# Patient Record
Sex: Female | Born: 1960 | Race: White | Hispanic: No | Marital: Married | Smoking: Never smoker
Health system: Southern US, Community
[De-identification: ages and names within clinical notes are randomized; demographics above are authoritative.]

## PROBLEM LIST (undated history)

## (undated) DIAGNOSIS — T7840XA Allergy, unspecified, initial encounter: Secondary | ICD-10-CM

## (undated) DIAGNOSIS — I10 Essential (primary) hypertension: Secondary | ICD-10-CM

## (undated) DIAGNOSIS — E785 Hyperlipidemia, unspecified: Secondary | ICD-10-CM

## (undated) DIAGNOSIS — E119 Type 2 diabetes mellitus without complications: Secondary | ICD-10-CM

## (undated) HISTORY — DX: Hyperlipidemia, unspecified: E78.5

## (undated) HISTORY — DX: Allergy, unspecified, initial encounter: T78.40XA

## (undated) HISTORY — PX: WISDOM TOOTH EXTRACTION: SHX21

## (undated) HISTORY — DX: Type 2 diabetes mellitus without complications: E11.9

## (undated) HISTORY — PX: UMBILICAL HERNIA REPAIR: SUR1181

---

## 2005-10-01 ENCOUNTER — Ambulatory Visit: Payer: Self-pay | Admitting: Family Medicine

## 2006-03-10 HISTORY — PX: OTHER SURGICAL HISTORY: SHX169

## 2006-03-10 HISTORY — PX: FACIAL FRACTURE SURGERY: SHX1570

## 2007-02-10 ENCOUNTER — Encounter: Payer: Self-pay | Admitting: Orthopedic Surgery

## 2007-03-11 ENCOUNTER — Encounter: Payer: Self-pay | Admitting: Orthopedic Surgery

## 2007-04-11 ENCOUNTER — Encounter: Payer: Self-pay | Admitting: Orthopedic Surgery

## 2007-05-09 ENCOUNTER — Encounter: Payer: Self-pay | Admitting: Orthopedic Surgery

## 2007-06-09 ENCOUNTER — Encounter: Payer: Self-pay | Admitting: Orthopedic Surgery

## 2007-07-09 ENCOUNTER — Encounter: Payer: Self-pay | Admitting: Orthopedic Surgery

## 2007-08-09 ENCOUNTER — Encounter: Payer: Self-pay | Admitting: Orthopedic Surgery

## 2012-03-06 ENCOUNTER — Emergency Department: Payer: Self-pay | Admitting: Emergency Medicine

## 2012-03-06 LAB — CBC
HGB: 15.7 g/dL (ref 12.0–16.0)
MCH: 29.9 pg (ref 26.0–34.0)
MCHC: 32.9 g/dL (ref 32.0–36.0)
MCV: 91 fL (ref 80–100)
Platelet: 344 10*3/uL (ref 150–440)
RBC: 5.26 10*6/uL — ABNORMAL HIGH (ref 3.80–5.20)
RDW: 13.7 % (ref 11.5–14.5)

## 2012-03-06 LAB — COMPREHENSIVE METABOLIC PANEL
Albumin: 4 g/dL (ref 3.4–5.0)
Alkaline Phosphatase: 133 U/L (ref 50–136)
Anion Gap: 10 (ref 7–16)
Bilirubin,Total: 0.5 mg/dL (ref 0.2–1.0)
Chloride: 101 mmol/L (ref 98–107)
Co2: 28 mmol/L (ref 21–32)
Creatinine: 0.68 mg/dL (ref 0.60–1.30)
EGFR (African American): >60
Glucose: 159 mg/dL — ABNORMAL HIGH (ref 65–99)
Osmolality: 280 (ref 275–301)
Potassium: 4 mmol/L (ref 3.5–5.1)
Sodium: 139 mmol/L (ref 136–145)
Total Protein: 8.9 g/dL — ABNORMAL HIGH (ref 6.4–8.2)

## 2012-03-25 ENCOUNTER — Ambulatory Visit: Payer: Self-pay | Admitting: Surgery

## 2012-03-25 LAB — CBC WITH DIFFERENTIAL/PLATELET
Basophil #: 0.1 10*3/uL (ref 0.0–0.1)
Eosinophil #: 0.1 10*3/uL (ref 0.0–0.7)
Eosinophil %: 0.9 %
HCT: 41.2 % (ref 35.0–47.0)
HGB: 14 g/dL (ref 12.0–16.0)
Lymphocyte #: 1.8 10*3/uL (ref 1.0–3.6)
Lymphocyte %: 26.3 %
MCV: 91 fL (ref 80–100)
Neutrophil #: 4.3 10*3/uL (ref 1.4–6.5)
Neutrophil %: 63.3 %
Platelet: 334 10*3/uL (ref 150–440)
RBC: 4.51 10*6/uL (ref 3.80–5.20)

## 2012-03-25 LAB — HEPATIC FUNCTION PANEL A (ARMC)
Albumin: 3.7 g/dL (ref 3.4–5.0)
Bilirubin,Total: 0.4 mg/dL (ref 0.2–1.0)
SGOT(AST): 35 U/L (ref 15–37)
SGPT (ALT): 50 U/L (ref 12–78)
Total Protein: 7.7 g/dL (ref 6.4–8.2)

## 2012-03-25 LAB — BASIC METABOLIC PANEL
Anion Gap: 7 (ref 7–16)
BUN: 9 mg/dL (ref 7–18)
Calcium, Total: 9.2 mg/dL (ref 8.5–10.1)
Chloride: 106 mmol/L (ref 98–107)
Co2: 27 mmol/L (ref 21–32)
Creatinine: 0.62 mg/dL (ref 0.60–1.30)
EGFR (African American): >60
EGFR (Non-African Amer.): >60
Glucose: 85 mg/dL (ref 65–99)
Sodium: 140 mmol/L (ref 136–145)

## 2012-03-29 ENCOUNTER — Ambulatory Visit: Payer: Self-pay | Admitting: Surgery

## 2012-03-31 ENCOUNTER — Ambulatory Visit: Payer: Self-pay | Admitting: Surgery

## 2012-04-01 LAB — CBC WITH DIFFERENTIAL/PLATELET
Eosinophil #: 0.1 10*3/uL (ref 0.0–0.7)
Eosinophil %: 0.7 %
HGB: 12.6 g/dL (ref 12.0–16.0)
Lymphocyte #: 1.6 10*3/uL (ref 1.0–3.6)
Lymphocyte %: 19.5 %
MCHC: 33.6 g/dL (ref 32.0–36.0)
MCV: 93 fL (ref 80–100)
Monocyte #: 0.8 x10 3/mm (ref 0.2–0.9)
Monocyte %: 9.7 %
Neutrophil #: 5.7 10*3/uL (ref 1.4–6.5)
Neutrophil %: 69.5 %

## 2012-04-01 LAB — BASIC METABOLIC PANEL
Anion Gap: 4 — ABNORMAL LOW (ref 7–16)
BUN: 7 mg/dL (ref 7–18)
Chloride: 106 mmol/L (ref 98–107)
Osmolality: 275 (ref 275–301)

## 2013-11-21 IMAGING — CR DG CHEST 2V
1 series · 2 of 2 positions shown · non-contrast
Comparison: none

REASON FOR EXAM: atelectasis
COMMENTS:

[Series 1: w chest pa · 0.14mm/px · 2 of 2 slices shown]
[im 1/2]
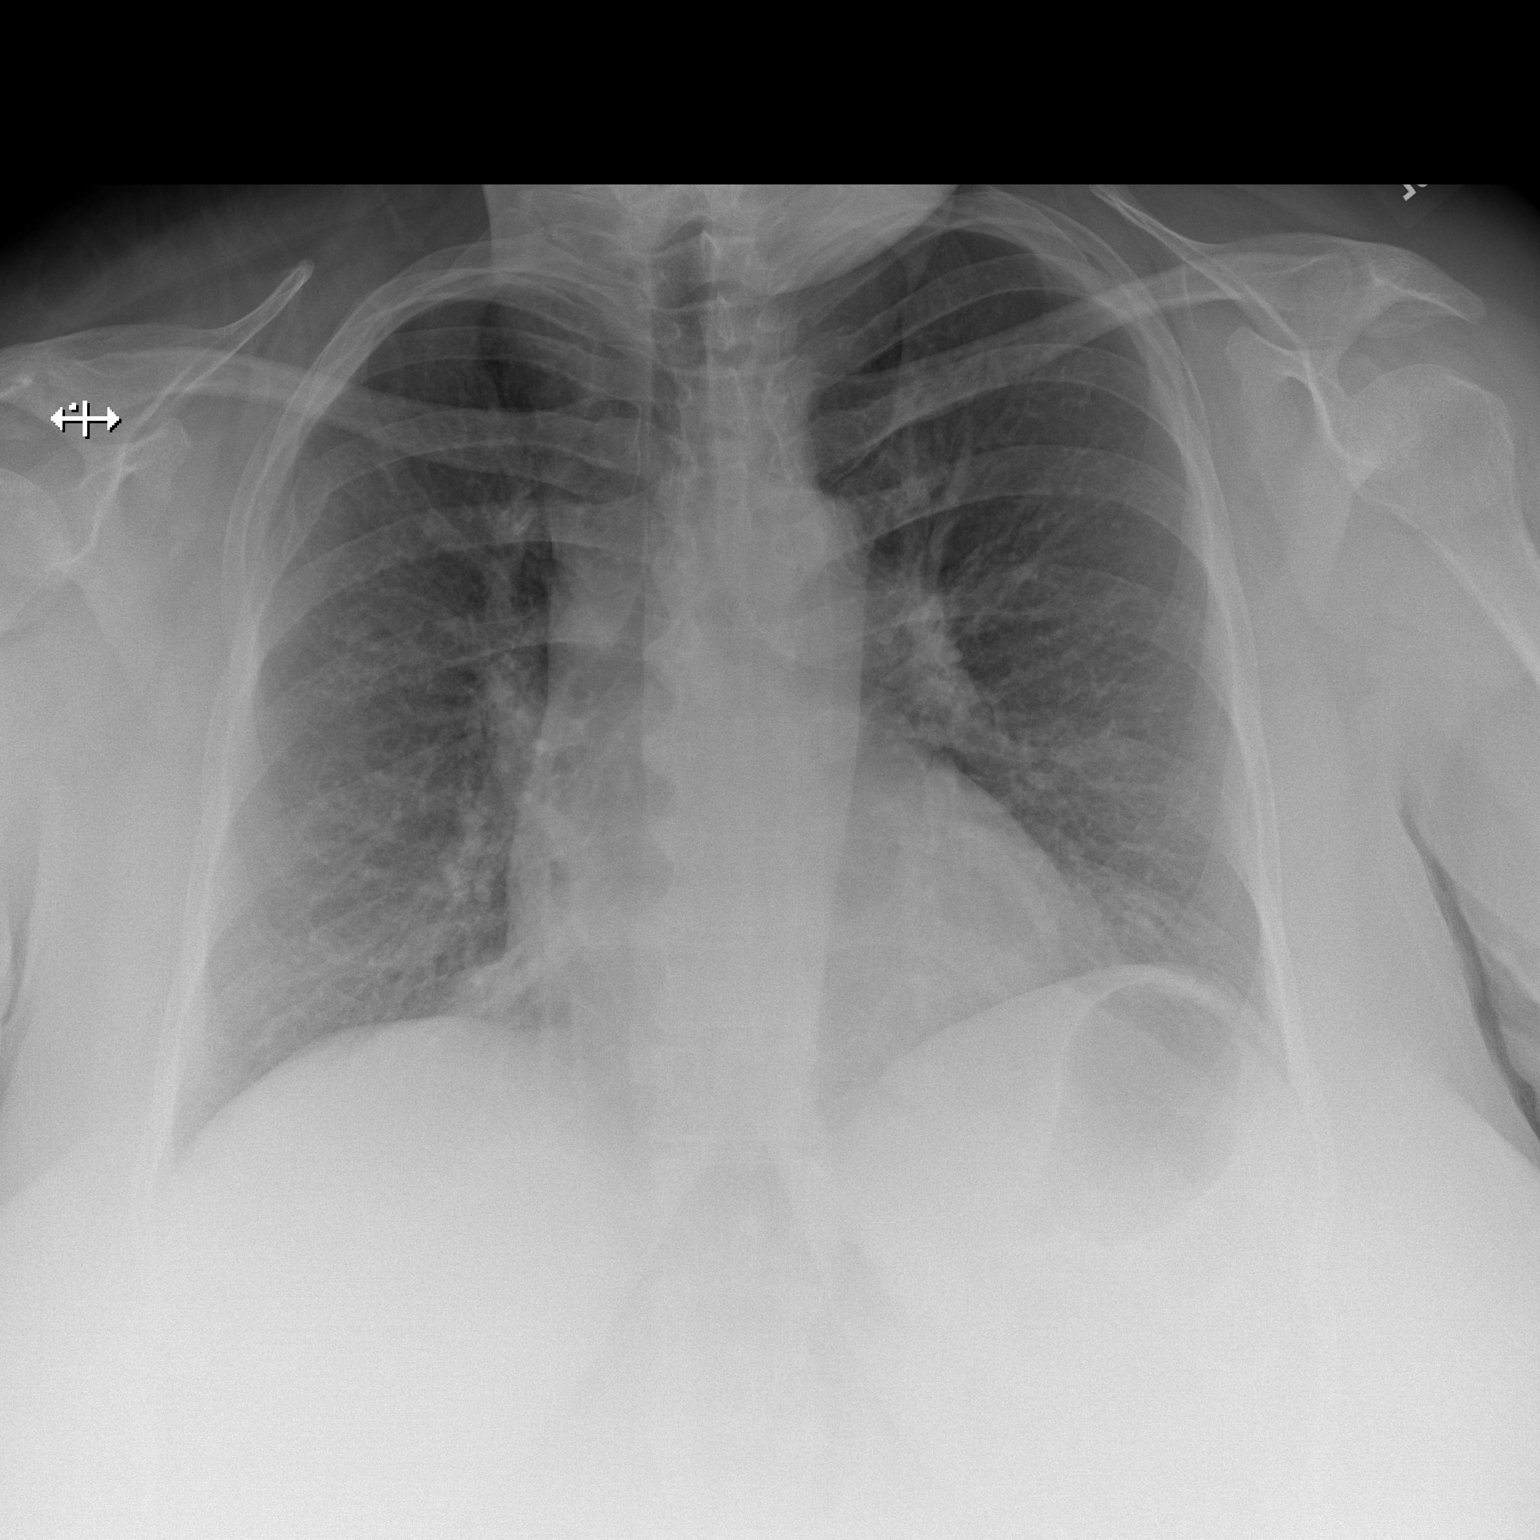
[im 2/2]
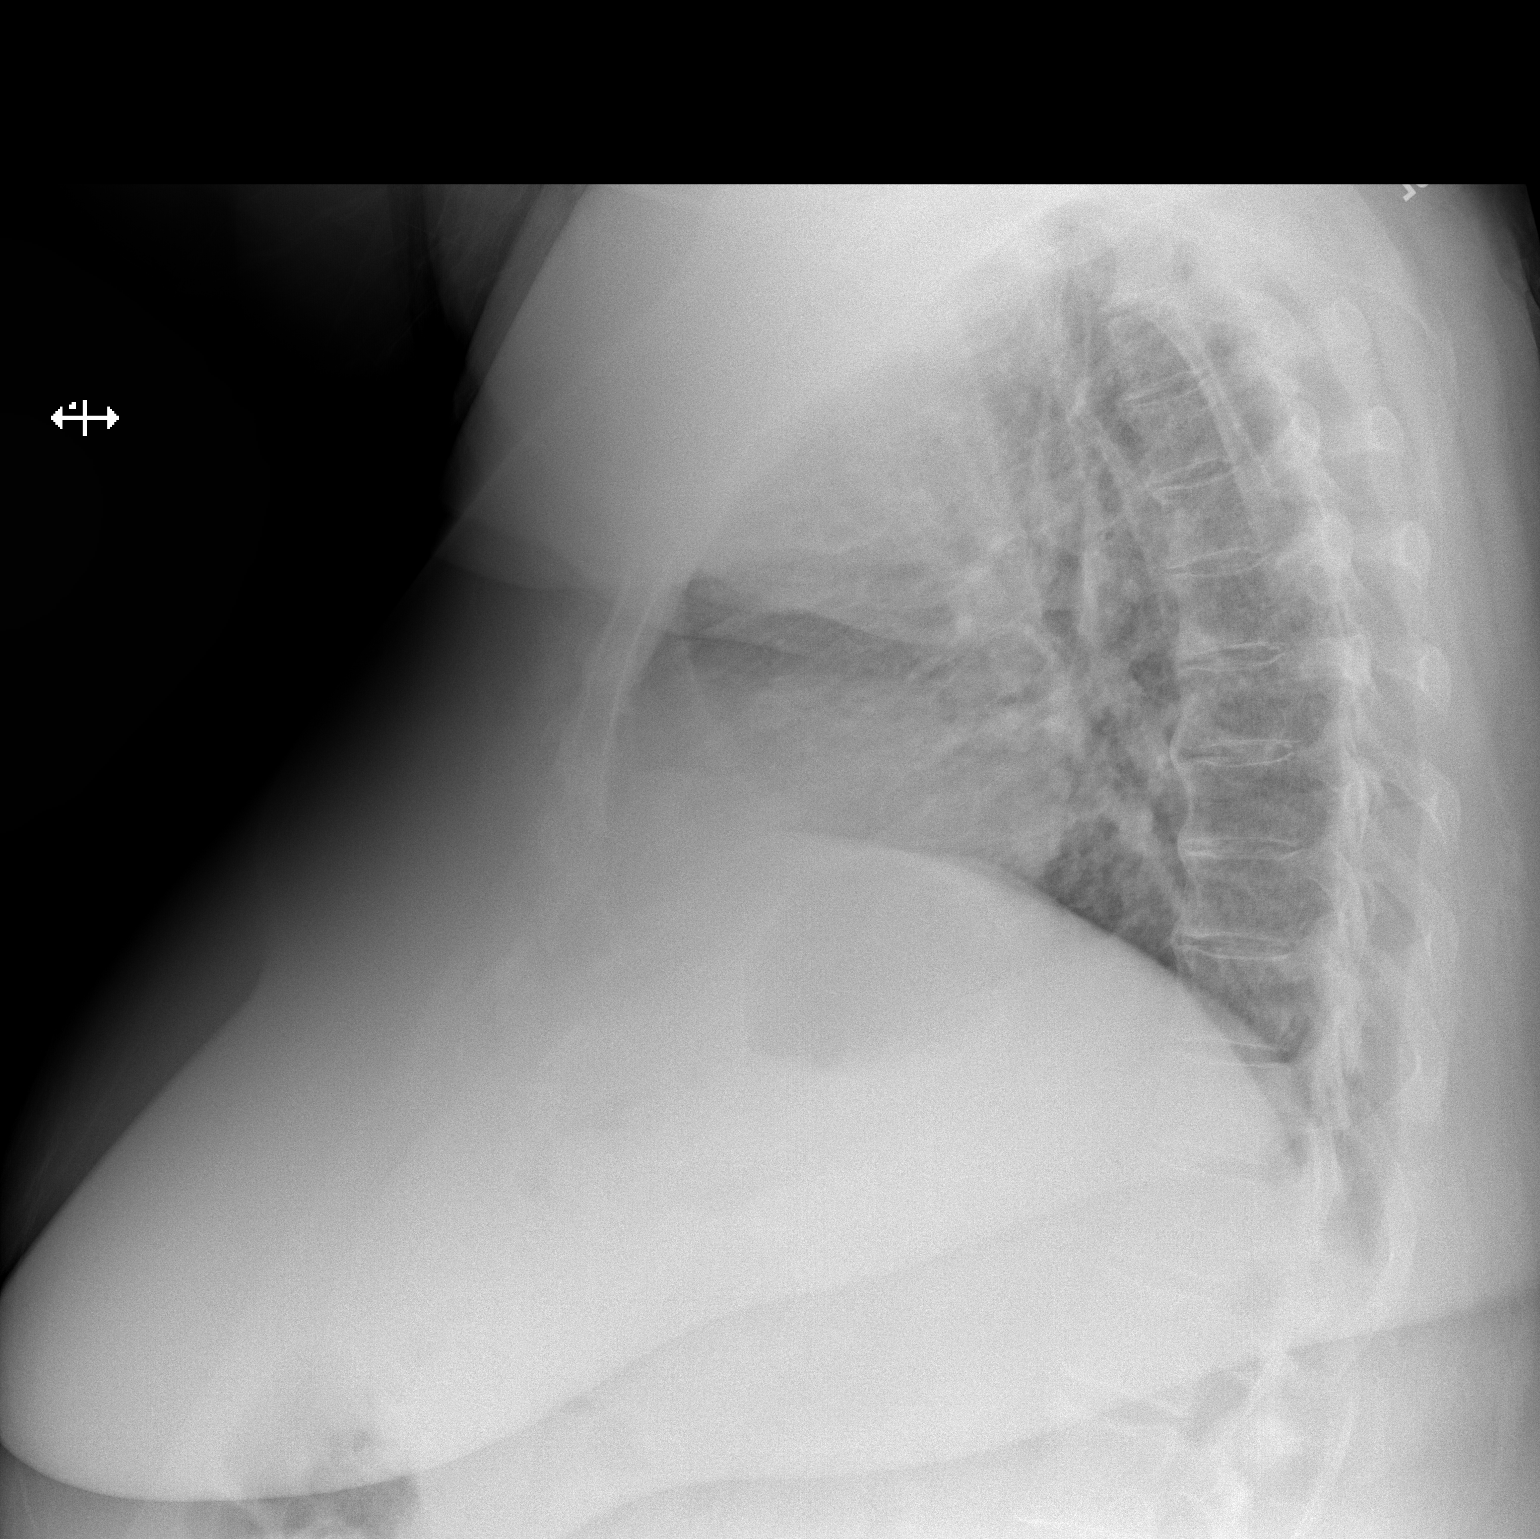

[2 of 2 positions shown; findings below may reference images not displayed]

PROCEDURE:     DXR - DXR CHEST PA (OR AP) AND LATERAL  - March 29, 2012  [DATE]

RESULT:     The lungs are clear. The heart and pulmonary vessels are normal.
The bony and mediastinal structures are unremarkable. There is an old healed
right fifth rib fracture posterior laterally. There is no effusion. There is
no pneumothorax or evidence of congestive failure.
IMPRESSION: No acute cardiopulmonary disease.

[REDACTED]

## 2014-06-30 NOTE — H&P (Signed)
PATIENT NAME:  Karen Miller, Karen Miller MR#:  [id number] DATE OF BIRTH:  05/27/1960  DATE OF ADMISSION:  03/31/2012  CHIEF COMPLAINT: Umbilical hernia.   HISTORY OF PRESENT ILLNESS: This is a 54 year old female patient with an umbilical hernia. She is here for elective repair. She has had pain associated with this and bulge starting several weeks ago.   PAST MEDICAL HISTORY: None.   PAST SURGICAL HISTORY: None.   ALLERGIES: None.   MEDICATIONS: Percocet.   FAMILY HISTORY: Noncontributory.   REVIEW OF SYSTEMS: Ten-system review has been performed and negative with the exception of that mentioned in the HPI.   PHYSICAL EXAMINATION: GENERAL: Healthy female patient.  HEENT: No scleral icterus.  NECK: No palpable neck nodes.  CHEST: Clear to auscultation.  CARDIAC: Regular rate and rhythm.  ABDOMEN: Soft and nontender. There is an umbilical hernia present. This hernia noted is in the supraumbilical area  EXTREMITIES: Without edema.  NEUROLOGIC: Grossly intact.  INTEGUMENT: No jaundice. .   ASSESSMENT AND PLAN: This is a patient with a supraumbilical ventral hernia. She is here for repair, possibly with mesh. The rationale for this has been discussed. The options of observation have been reviewed.  The risks of bleeding, infection, recurrence, and cosmetic deformity have been discussed. She understood and agreed to proceed. She will be given IV antibiotics and VTE prophylaxis.    ____________________________ Jerrol Banana Burt Knack, MD rec:cb D: 03/30/2012 16:44:25 ET T: 03/30/2012 16:50:35 ET JOB#: [id number]  cc: Jerrol Banana. Burt Knack, MD, <Dictator> Florene Glen MD ELECTRONICALLY SIGNED 03/31/2012 14:35

## 2014-06-30 NOTE — Op Note (Signed)
PATIENT NAME:  Karen Miller, Karen Miller MR#:  [id number] DATE OF BIRTH:  April 25, 1960  DATE OF PROCEDURE:  03/31/2012  PREOPERATIVE DIAGNOSIS:  Incarcerated periumbilical ventral hernia.   POSTOPERATIVE DIAGNOSIS:  Incarcerated periumbilical ventral hernia.   PROCEDURE:  Repair of incarcerated periumbilical ventral hernia.   SURGEON:  Phoebe Perch, M.D.   ANESTHESIA:  General with endotracheal tube.   ASSISTANT:  Joie Bimler, PA-S   INDICATION:  This is a patient with an incarcerated periumbilical, supraumbilical ventral hernia with a history of requiring Emergency Room visits, which has spontaneously reduced partially. She is here for elective repair with mesh. The rationale for repair was discussed. The options of observation reviewed, though high likelihood of utilizing mesh and the risks of bleeding, infection, infection of the mesh requiring removal, bowel injury and cosmetic deformity and recurrence were all reviewed for she and her husband in the preop holding area. They understood and agreed to proceed.   FINDINGS:  Large amount of incarcerated small bowel and colon and omentum within a large supraumbilical ventral hernia sac. The rent itself only measured 4 x 4 cm, but the sac contained 2 loops of small bowel and 1 loop of colon and a large amount of omentum with signs of chronic incarceration with saponification. Some of this was removed and sent for pathology.   DESCRIPTION OF PROCEDURE:  The patient was induced with general anesthesia, given IV antibiotics. VTE prophylaxis was in place. She was prepped and draped in a sterile fashion. Marcaine was infiltrated in the skin and subcutaneous tissues, around the supraumbilical area where an incarcerated supraumbilical ventral hernia had been identified. A midline incision was utilized to open and explore the subcutaneous tissue, and a large multiloculated hernia sac was identified. Dissection around the sac down to the umbilical area was  performed. There was a small umbilical hernia as well with incarcerated omentum. This sac was opened, and this allowed for placement of a finger into the abdominal cavity. The bridge between the supraumbilical and umbilical ventral hernias were released, and then the sac was excised. Portions of the incarcerated omentum were found to be saponified, and some of this was excised between clamps and tied with 0 or 2-0 Vicryl ligatures and sent off for examination. Further adhesiolysis was performed on loops of small bowel, which were adherent to the edges of the hernia sac, and the hernia sac was excised from the edge of the fascia and sent off for examination. No small bowel or large bowel rent or injury occurred.   Measuring of the hernial rent showed a 4 x 4 cm hernial rent. An 8 cm circular Ventralex patch with PTFE was placed into the abdominal cavity, once assuring that hemostasis had been adequately maintained. This was then held in place with figure-of-eight 0 Prolenes holding the mesh in place. An attempt at closing the redundant hernia sac over the top of the mesh was performed to protect the mesh. This was done with Prolenes and Vicryl sutures as well.   The sponge, lap and needle counts were correct at this time. Additional Marcaine was placed for a total of 30 mL, and then the skin of the umbilicus was tacked back to the fascia with figure-of-eight 3-0 Vicryls.  Deep sutures of 0 Vicryl were placed in 2 layers, followed by skin staples and a sterile dressing.   Prior to completely closing the patient, a 7 mm JP drain was brought in through a lateral incision and held in with 3-0 nylon. This was  then placed to bulb suction.   The patient tolerated the procedure well. There were no complications. She was taken to the recovery room in stable condition to be admitted for continued care.     ____________________________ Jerrol Banana. Burt Knack, MD rec:dm D: 03/31/2012 12:07:52 ET T: 03/31/2012 12:40:44  ET JOB#: [id number]  cc: Jerrol Banana. Burt Knack, MD, <Dictator> Florene Glen MD ELECTRONICALLY SIGNED 03/31/2012 14:36

## 2019-06-10 ENCOUNTER — Ambulatory Visit: Payer: Self-pay

## 2021-07-12 ENCOUNTER — Other Ambulatory Visit: Payer: Self-pay

## 2021-07-12 ENCOUNTER — Encounter (HOSPITAL_COMMUNITY): Payer: Self-pay | Admitting: Emergency Medicine

## 2021-07-12 ENCOUNTER — Emergency Department (HOSPITAL_COMMUNITY): Payer: BC Managed Care – PPO

## 2021-07-12 ENCOUNTER — Ambulatory Visit: Payer: Self-pay | Admitting: *Deleted

## 2021-07-12 ENCOUNTER — Inpatient Hospital Stay (HOSPITAL_COMMUNITY)
Admission: EM | Admit: 2021-07-12 | Discharge: 2021-07-15 | DRG: 304 | Disposition: A | Discharge status: Home / Self Care | Payer: BC Managed Care – PPO | Attending: Internal Medicine | Admitting: Internal Medicine

## 2021-07-12 DIAGNOSIS — E876 Hypokalemia: Secondary | ICD-10-CM

## 2021-07-12 DIAGNOSIS — E1169 Type 2 diabetes mellitus with other specified complication: Secondary | ICD-10-CM | POA: Diagnosis present

## 2021-07-12 DIAGNOSIS — R8271 Bacteriuria: Secondary | ICD-10-CM

## 2021-07-12 DIAGNOSIS — I5031 Acute diastolic (congestive) heart failure: Secondary | ICD-10-CM | POA: Diagnosis present

## 2021-07-12 DIAGNOSIS — R778 Other specified abnormalities of plasma proteins: Secondary | ICD-10-CM | POA: Diagnosis not present

## 2021-07-12 DIAGNOSIS — R7989 Other specified abnormal findings of blood chemistry: Secondary | ICD-10-CM

## 2021-07-12 DIAGNOSIS — I509 Heart failure, unspecified: Secondary | ICD-10-CM | POA: Diagnosis not present

## 2021-07-12 DIAGNOSIS — I4891 Unspecified atrial fibrillation: Secondary | ICD-10-CM | POA: Diagnosis present

## 2021-07-12 DIAGNOSIS — I161 Hypertensive emergency: Principal | ICD-10-CM | POA: Diagnosis present

## 2021-07-12 DIAGNOSIS — N390 Urinary tract infection, site not specified: Secondary | ICD-10-CM | POA: Diagnosis present

## 2021-07-12 DIAGNOSIS — I160 Hypertensive urgency: Secondary | ICD-10-CM | POA: Diagnosis present

## 2021-07-12 DIAGNOSIS — R739 Hyperglycemia, unspecified: Secondary | ICD-10-CM

## 2021-07-12 DIAGNOSIS — Z8249 Family history of ischemic heart disease and other diseases of the circulatory system: Secondary | ICD-10-CM

## 2021-07-12 DIAGNOSIS — E785 Hyperlipidemia, unspecified: Secondary | ICD-10-CM | POA: Diagnosis present

## 2021-07-12 DIAGNOSIS — Z6841 Body Mass Index (BMI) 40.0 and over, adult: Secondary | ICD-10-CM

## 2021-07-12 DIAGNOSIS — I5032 Chronic diastolic (congestive) heart failure: Secondary | ICD-10-CM | POA: Insufficient documentation

## 2021-07-12 DIAGNOSIS — I3481 Nonrheumatic mitral (valve) annulus calcification: Secondary | ICD-10-CM | POA: Diagnosis present

## 2021-07-12 DIAGNOSIS — E1165 Type 2 diabetes mellitus with hyperglycemia: Secondary | ICD-10-CM | POA: Diagnosis present

## 2021-07-12 DIAGNOSIS — I110 Hypertensive heart disease with heart failure: Secondary | ICD-10-CM | POA: Diagnosis present

## 2021-07-12 DIAGNOSIS — R000 Tachycardia, unspecified: Secondary | ICD-10-CM | POA: Diagnosis present

## 2021-07-12 DIAGNOSIS — B9620 Unspecified Escherichia coli [E. coli] as the cause of diseases classified elsewhere: Secondary | ICD-10-CM | POA: Diagnosis present

## 2021-07-12 DIAGNOSIS — I493 Ventricular premature depolarization: Secondary | ICD-10-CM | POA: Diagnosis present

## 2021-07-12 HISTORY — DX: Essential (primary) hypertension: I10

## 2021-07-12 LAB — BRAIN NATRIURETIC PEPTIDE: B Natriuretic Peptide: 169.5 pg/mL — ABNORMAL HIGH (ref 0.0–100.0)

## 2021-07-12 LAB — CBC
HCT: 49.6 % — ABNORMAL HIGH (ref 36.0–46.0)
Hemoglobin: 16.6 g/dL — ABNORMAL HIGH (ref 12.0–15.0)
MCH: 29.7 pg (ref 26.0–34.0)
MCHC: 33.5 g/dL (ref 30.0–36.0)
MCV: 88.7 fL (ref 80.0–100.0)
Platelets: 334 10*3/uL (ref 150–400)
RBC: 5.59 MIL/uL — ABNORMAL HIGH (ref 3.87–5.11)
RDW: 12.5 % (ref 11.5–15.5)
WBC: 11.4 10*3/uL — ABNORMAL HIGH (ref 4.0–10.5)
nRBC: 0 % (ref 0.0–0.2)

## 2021-07-12 LAB — URINALYSIS, ROUTINE W REFLEX MICROSCOPIC
Bilirubin Urine: NEGATIVE
Glucose, UA: 50 mg/dL — AB
Ketones, ur: 20 mg/dL — AB
Nitrite: NEGATIVE
Protein, ur: 100 mg/dL — AB
RBC / HPF: >50 RBC/hpf — ABNORMAL HIGH (ref 0–5)
Specific Gravity, Urine: 1.02 (ref 1.005–1.030)
WBC, UA: >50 WBC/hpf — ABNORMAL HIGH (ref 0–5)
pH: 5 (ref 5.0–8.0)

## 2021-07-12 LAB — COMPREHENSIVE METABOLIC PANEL
ALT: 29 U/L (ref 0–44)
AST: 30 U/L (ref 15–41)
Albumin: 4.1 g/dL (ref 3.5–5.0)
Alkaline Phosphatase: 99 U/L (ref 38–126)
Anion gap: 11 (ref 5–15)
BUN: 10 mg/dL (ref 6–20)
CO2: 25 mmol/L (ref 22–32)
Calcium: 9.5 mg/dL (ref 8.9–10.3)
Chloride: 99 mmol/L (ref 98–111)
Creatinine, Ser: 0.71 mg/dL (ref 0.44–1.00)
GFR, Estimated: >60 mL/min (ref >60)
Glucose, Bld: 239 mg/dL — ABNORMAL HIGH (ref 70–99)
Potassium: 3.4 mmol/L — ABNORMAL LOW (ref 3.5–5.1)
Sodium: 135 mmol/L (ref 135–145)
Total Bilirubin: 0.8 mg/dL (ref 0.3–1.2)
Total Protein: 8.6 g/dL — ABNORMAL HIGH (ref 6.5–8.1)

## 2021-07-12 LAB — LIPID PANEL
Cholesterol: 231 mg/dL — ABNORMAL HIGH (ref 0–200)
HDL: 40 mg/dL — ABNORMAL LOW (ref >40)
LDL Cholesterol: 167 mg/dL — ABNORMAL HIGH (ref 0–99)
Total CHOL/HDL Ratio: 5.8 RATIO
Triglycerides: 118 mg/dL (ref <150)
VLDL: 24 mg/dL (ref 0–40)

## 2021-07-12 LAB — HIV ANTIBODY (ROUTINE TESTING W REFLEX): HIV Screen 4th Generation wRfx: NONREACTIVE

## 2021-07-12 LAB — TROPONIN I (HIGH SENSITIVITY)
Troponin I (High Sensitivity): 295 ng/L (ref <18)
Troponin I (High Sensitivity): 268 ng/L (ref <18)
Troponin I (High Sensitivity): 282 ng/L (ref <18)
Troponin I (High Sensitivity): 295 ng/L (ref <18)

## 2021-07-12 LAB — HEMOGLOBIN A1C
Hgb A1c MFr Bld: 8.9 % — ABNORMAL HIGH (ref 4.8–5.6)
Mean Plasma Glucose: 208.73 mg/dL

## 2021-07-12 LAB — GLUCOSE, CAPILLARY: Glucose-Capillary: 313 mg/dL — ABNORMAL HIGH (ref 70–99)

## 2021-07-12 MED ORDER — ASPIRIN 81 MG PO CHEW
324.0000 mg | CHEWABLE_TABLET | Freq: Once | ORAL | Status: AC
  Administered 2021-07-12: 324 mg via ORAL
  Filled 2021-07-12: qty 4

## 2021-07-12 MED ORDER — INSULIN ASPART 100 UNIT/ML IJ SOLN
0.0000 [IU] | Freq: Three times a day (TID) | INTRAMUSCULAR | Status: DC
  Administered 2021-07-13: 5 [IU] via SUBCUTANEOUS
  Administered 2021-07-13: 3 [IU] via SUBCUTANEOUS

## 2021-07-12 MED ORDER — LABETALOL HCL 5 MG/ML IV SOLN
20.0000 mg | Freq: Once | INTRAVENOUS | Status: AC
Start: 2021-07-12 — End: 2021-07-12
  Administered 2021-07-12: 20 mg via INTRAVENOUS
  Filled 2021-07-12: qty 4

## 2021-07-12 MED ORDER — HYDRALAZINE HCL 20 MG/ML IJ SOLN
10.0000 mg | Freq: Four times a day (QID) | INTRAMUSCULAR | Status: DC | PRN
  Administered 2021-07-12: 10 mg via INTRAVENOUS
  Filled 2021-07-12: qty 1

## 2021-07-12 MED ORDER — LISINOPRIL 5 MG PO TABS
5.0000 mg | ORAL_TABLET | Freq: Every day | ORAL | Status: DC
  Administered 2021-07-12 – 2021-07-13 (×2): 5 mg via ORAL
  Filled 2021-07-12 (×2): qty 1

## 2021-07-12 MED ORDER — LABETALOL HCL 5 MG/ML IV SOLN
20.0000 mg | Freq: Once | INTRAVENOUS | Status: AC
  Administered 2021-07-12: 20 mg via INTRAVENOUS
  Filled 2021-07-12: qty 4

## 2021-07-12 MED ORDER — IOHEXOL 350 MG/ML SOLN
65.0000 mL | Freq: Once | INTRAVENOUS | Status: AC | PRN
  Administered 2021-07-12: 65 mL via INTRAVENOUS

## 2021-07-12 MED ORDER — HEPARIN BOLUS VIA INFUSION
4000.0000 [IU] | Freq: Once | INTRAVENOUS | Status: AC
  Administered 2021-07-12: 4000 [IU] via INTRAVENOUS
  Filled 2021-07-12: qty 4000

## 2021-07-12 MED ORDER — NITROGLYCERIN 0.4 MG SL SUBL: 0.4000 mg | SUBLINGUAL_TABLET | SUBLINGUAL | Status: DC | PRN

## 2021-07-12 MED ORDER — SODIUM CHLORIDE 0.9 % IV SOLN
1.0000 g | Freq: Once | INTRAVENOUS | Status: DC
  Filled 2021-07-12: qty 10

## 2021-07-12 MED ORDER — HEPARIN (PORCINE) 25000 UT/250ML-% IV SOLN
1750.0000 [IU]/h | INTRAVENOUS | Status: DC
  Administered 2021-07-12: 1100 [IU]/h via INTRAVENOUS
  Filled 2021-07-12 (×2): qty 250

## 2021-07-12 MED ORDER — FUROSEMIDE 10 MG/ML IJ SOLN
40.0000 mg | Freq: Every day | INTRAMUSCULAR | Status: DC
  Administered 2021-07-12: 40 mg via INTRAVENOUS
  Filled 2021-07-12: qty 4

## 2021-07-12 NOTE — Progress Notes (Signed)
ANTICOAGULATION CONSULT NOTE - Initial Consult ? ?Pharmacy Consult for Heparin ?Indication:  NSTEMI ? ?No Known Allergies ? ?Patient Measurements: ?Height: 5'5" (165.1 cm) ?Weight: 120.45 kg (265 lb) ?IBW/kg (Calculated): 57 kg  ?Heparin Dosing Weight: 86 kg  ? ?Vital Signs: ?Temp: 98.1 ?F (36.7 ?C) (05/05 1122) ?Temp Source: Oral (05/05 1122) ?BP: 191/90 (05/05 1815) ?Pulse Rate: 99 (05/05 1815) ? ?Labs: ?Recent Labs  ?  07/12/21 ?1204 07/12/21 ?1444  ?HGB 16.6*  --   ?HCT 49.6*  --   ?PLT 334  --   ?CREATININE 0.71  --   ?TROPONINIHS 295* 268*  ? ? ?CrCl cannot be calculated (Unknown ideal weight.). ? ? ?Medical History: ?Past Medical History:  ?Diagnosis Date  ? Hypertension   ? ? ?Assessment: ?61 yo F presenting with NSTEMI, not on anticoagulation PTA. Pharmacy consulted for heparin dosing. Baseline Hgb 16.6, plt 334. No signs/symptoms of bleeding noted.  ? ?Goal of Therapy:  ?Heparin level 0.3-0.7 units/ml ?Monitor platelets by anticoagulation protocol: Yes ?  ?Plan:  ?Give heparin bolus 4000 units x 1, followed by infusion at 1100 units/hr. ?Check ~6 hr heparin level.  ?Daily CBC, heparin level. ?Monitor for signs/symptoms of bleeding. ? ? ? ?Vance Peper, PharmD ?PGY1 Pharmacy Resident ?07/12/2021 6:33 PM  ? ?Please check AMION for all [place] phone numbers ?After 10:00 PM, call [place] [phone] ? ? ? ?

## 2021-07-12 NOTE — Assessment & Plan Note (Addendum)
Calculated BMI is 42.  ?

## 2021-07-12 NOTE — Assessment & Plan Note (Addendum)
New diagnosis of T2DM ?Hgb A1c 8.9  ? ?Fasting glucose is 214. ?Patient was placed on insulin sliding scale during her hospitalization and started on metformin. ?Plan to continue metformin as outpatient and continue capillary glucose monitoring at home.  ?Life style modifications recommended.   ? ?Continue with statin therapy for dyslipidemia ?LDL 167 with total cholesterol 231.  ?

## 2021-07-12 NOTE — Telephone Encounter (Signed)
Reason for Disposition ? Patient sounds very sick or weak to the triager ?   Does not have a PCP.   She will probably go to urgent care after calling around to see if she can be seen today at a primary care office. ? ?Answer Assessment - Initial Assessment Questions ?1. DESCRIPTION: "Describe your dizziness." ?    The last 2 nights clammy hot feeling.   This morning I'm dizzy.   My glucose is 225.   I don't have a PCP.   My husband has diabetes so I used his meter.  I don't have diabetes but I haven't been going to a doctor. ? ?I understand I can't be seen today at [facility].   I let her know she would need to be scheduled a new pt appt to become established with a provider at [facility].    She has decided to call around and see if she can be seen somewhere else or go to the urgent care. ?I offered to make her a new pt appt but she wanted to call around other practices first and thanked me for my help.    ?2. LIGHTHEADED: "Do you feel lightheaded?" (e.g., somewhat faint, woozy, weak upon standing) ?    *No Answer* ?3. VERTIGO: "Do you feel like either you or the room is spinning or tilting?" (i.e. vertigo) ?    *No Answer* ?4. SEVERITY: "How bad is it?"  "Do you feel like you are going to faint?" "Can you stand and walk?" ?  - MILD: Feels slightly dizzy, but walking normally. ?  - MODERATE: Feels unsteady when walking, but not falling; interferes with normal activities (e.g., school, work). ?  - SEVERE: Unable to walk without falling, or requires assistance to walk without falling; feels like passing out now.  ?    *No Answer* ?5. ONSET:  "When did the dizziness begin?" ?    *No Answer* ?6. AGGRAVATING FACTORS: "Does anything make it worse?" (e.g., standing, change in head position) ?    *No Answer* ?7. HEART RATE: "Can you tell me your heart rate?" "How many beats in 15 seconds?"  (Note: not all patients can do this)   ?    *No Answer* ?8. CAUSE: "What do you think is causing the dizziness?" ?    *No  Answer* ?9. RECURRENT SYMPTOM: "Have you had dizziness before?" If Yes, ask: "When was the last time?" "What happened that time?" ?    *No Answer* ?10. OTHER SYMPTOMS: "Do you have any other symptoms?" (e.g., fever, chest pain, vomiting, diarrhea, bleeding) ?      *No Answer* ?11. PREGNANCY: "Is there any chance you are pregnant?" "When was your last menstrual period?" ?      *No Answer* ? ?Protocols used: Dizziness - Lightheadedness-A-AH ? ?

## 2021-07-12 NOTE — H&P (Signed)
?History and Physical  ? ? ?Patient: Karen Miller KKX:[id number] DOB: 02-18-1961 ?DOA: 07/12/2021 ?DOS: the patient was seen and examined on 07/13/2021 ?PCP: Pcp, No  ?Patient coming from: Home ? ?Chief Complaint:  ?Chief Complaint  ?Patient presents with  ? Hyperglycemia  ? ?HPI: Karen Miller is a 61 y.o. female with medical history significant of HTN and morbid obesity who presents with general malaise and dizziness.  ? ?Reports that yesterday she just didn't feel well but denies any chest pain, shortness of breath. Then woke up today with lightheadedness worse with movement.She presented to urgent care and was diagnosed with UTI and atrial fibrillation on EKG.  ? No nausea, vomiting or diarrhea. No dysuria, increase frequency, or urgency. Has chronic LE edema that is no worse. She has hx of HTN going back to 2008 but took herself off antihypertensives years ago. Lack of follow up with PCP. ? ?In ED, she was hypertensive up to 194/120s and have elevated troponin of 295-->268. EKG with PVCs in NSR. Mild leukocytosis of 11.4K, Hgb 16.6. BG of 239 with no anion gap.  CTA chest showed mild cardiomegaly and left ventricular hypertrophy.  Severe mitral annular calcification.  No PE seen, mild pulmonary vascular congestion. ? ?UA showed moderate leukocyte, negative nitrite, many bacteria and greater than 50 WBCs ?Review of Systems: As mentioned in the history of present illness. All other systems reviewed and are negative. ?Past Medical History:  ?Diagnosis Date  ? Hypertension   ? ?History reviewed. No pertinent surgical history. ?Social History: Denies tobacco, alcohol illicit drug use.  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 ? ?No Known Allergies ? ?History reviewed. No pertinent family history. ? ?Prior to Admission medications   ?Not on File  ? ? ?Physical Exam: ?Vitals:  ? 07/12/21 2200 07/12/21 2255 07/13/21 0000 07/13/21 0055  ?BP: (!) 198/84 (!) 163/65 (!) 155/78 (!) 150/83  ?Pulse: (!)  105 92 95 90  ?Resp: (!) 25 19 (!) 24 (!) 21  ?Temp:  97.9 ?F (36.6 ?C)  98 ?F (36.7 ?C)  ?TempSrc:  Oral  Oral  ?SpO2: 98% 98% 97% 95%  ?Weight:      ?Height:      ? ?Constitutional: NAD, calm, comfortable, morbidly obese female laying at approximately 10 degree incline in bed ?Eyes: lids and conjunctivae normal, strabismus of the right eye ?ENMT: Mucous membranes are moist.  ?Neck: normal, supple ?Respiratory: clear to auscultation bilaterally, no wheezing, no crackles. Normal respiratory effort.  ?Cardiovascular: Regular rate and rhythm, no murmurs / rubs / gallops.  Nonpitting edema of bilateral lower ankle. ?Abdomen: no tenderness,  Bowel sounds positive.  ?Musculoskeletal: no clubbing / cyanosis. No joint deformity upper and lower extremities. Normal muscle tone.  ?Skin: no rashes, lesions, ulcers. No induration ?Neurologic: CN 2-12 grossly intact. Strength 5/5 in all 4.  ?Psychiatric: Normal judgment and insight. Alert and oriented x 3. Normal mood. ?Data Reviewed: ? ?See HPI ? ?Assessment and Plan: ?* Hypertensive emergency ?Presents with uncontrolled HTN BP 190/120 with elevated troponin and radiological signs of new CHF. Pt has been nonadherent for years to antihypertensives and does not have PCP.  ?-IV '20mg'$  Labetalol was given in ED  ?-Start IV '40mg'$  Lasix for suspected new CHF exacerbation   ?-Start '10mg'$  po Lisinopril  ?-Target BP goal of >160/>110 in the first 24 hrs ? ? ?Elevated troponin ?Troponin 295-->268 ?Started on IV heparin infusion in the ED but is downward trending so possibly demand ischemia  to uncontrolled HTN and new CHF exac Continue to trend troponin overnight. If trend continues to go down, favor discontinuation of infusion but will defer to cardiology for further recommendation. ?-Mentions of atrial fibrillation seen on EKG which we are not able to access. EKG here with NSR with PVCs. Continue to monitor on telemetry overnight.  ?-cardiology recommends coronary CTA ? ?Acute CHF  (congestive heart failure) ([place]) ?Awaiting BNP but likely to be falsely low due to morbid obesity. Possible new CHF due to  CT chest shows mild pulmonary vascular congestion and elevated troponin likely demand ischemia. ?-Trial of IV Lasix 40 mg ?- Strict intake and output, daily weights ?-Obtain echocardiogram ? ?Hyperglycemia ?CBG of 239. Suspect undiagnosed diabetes. ?Check HbA1C ?-start sensitive SSI ACHS  ? ?Obesity, Class III, BMI 40-49.9 (morbid obesity) ([place]) ?Encouraged weight loss and pt reports she has been trying to lose weight ? ?Asymptomatic bacteriuria ?Hold on antibiotics for now pending urine culture. ? ? ? ? ? Advance Care Planning:   Code Status: Full Code  ? ?Consults: Cardiology ? ?Family Communication: Husband at bedside ? ?Severity of Illness: ?The appropriate patient status for this patient is OBSERVATION. Observation status is judged to be reasonable and necessary in order to provide the required intensity of service to ensure the patient's safety. The patient's presenting symptoms, physical exam findings, and initial radiographic and laboratory data in the context of their medical condition is felt to place them at decreased risk for further clinical deterioration. Furthermore, it is anticipated that the patient will be medically stable for discharge from the hospital within 2 midnights of admission.  ? ?Author: Orene Desanctis, DO ?07/13/2021 1:43 AM ? ?For on call review [email or web address].  ?

## 2021-07-12 NOTE — Assessment & Plan Note (Deleted)
Presented with blood pressure in the 190s over 120s with elevated troponin and radiological imaging concerning for new CHF.  However doubt this is a true hypertensive emergency.  Patient ?
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
## 2021-07-12 NOTE — ED Provider Triage Note (Signed)
Emergency Medicine Provider Triage Evaluation Note ? ?Karen Miller , a 61 y.o. female  was evaluated in triage.  Pt complains of dizzy. Elevated CBG, dizzy, clammy.  Seen at [facility] but sent here with new onset afib, elevated BP and hyperglycic with UTI ? ?Review of Systems  ?Positive: As above ?Negative: As above ? ?Physical Exam  ?BP (!) 194/126 (BP Location: Right Arm)   Pulse 79   Temp 98.1 ?F (36.7 ?C) (Oral)   Resp 18   SpO2 99%  ?Gen:   Awake, no distress   ?Resp:  Normal effort  ?MSK:   Moves extremities without difficulty  ?Other:   ? ?Medical Decision Making  ?Medically screening exam initiated at 11:47 AM.  Appropriate orders placed.  Karen Miller was informed that the remainder of the evaluation will be completed by another provider, this initial triage assessment does not replace that evaluation, and the importance of remaining in the ED until their evaluation is complete. ? ? ?  ?Domenic Moras, PA-C ?07/12/21 1151 ? ?

## 2021-07-12 NOTE — ED Notes (Signed)
Lab called by this RN and asked to run urine culture from previously collected specimen. ?

## 2021-07-12 NOTE — Progress Notes (Signed)
?   07/12/21 2055  ?Assess: MEWS Score  ?Temp 98.3 ?F (36.8 ?C)  ?BP (!) 215/115  ?Pulse Rate 100  ?ECG Heart Rate 100  ?Resp (!) 21  ?Level of Consciousness Alert  ?SpO2 99 %  ?Assess: MEWS Score  ?MEWS Temp 0  ?MEWS Systolic 2  ?MEWS Pulse 0  ?MEWS RR 1  ?MEWS LOC 0  ?MEWS Score 3  ?MEWS Score Color Yellow  ?Assess: if the MEWS score is Yellow or Red  ?Were vital signs taken at a resting state? Yes  ?Focused Assessment No change from prior assessment  ?Early Detection of Sepsis Score *See Row Information* Low  ?MEWS guidelines implemented *See Row Information* Yes  ?Treat  ?MEWS Interventions Administered prn meds/treatments;Escalated (See documentation below)  ?Pain Scale 0-10  ?Pain Score 0  ?Take Vital Signs  ?Increase Vital Sign Frequency  Yellow: Q 2hr X 2 then Q 4hr X 2, if remains yellow, continue Q 4hrs  ?Escalate  ?MEWS: Escalate Yellow: discuss with charge nurse/RN and consider discussing with provider and RRT  ?Notify: Charge Nurse/RN  ?Name of Charge Nurse/RN Notified Mayme RN  ?Date Charge Nurse/RN Notified 07/12/21  ?Time Charge Nurse/RN Notified 2205  ?Notify: Provider  ?Provider Name/Title Dr. Cyd Silence  ?Date Provider Notified 07/12/21  ?Time Provider Notified 2100  ?Notification Type Page  ?Provider response Evaluate remotely  ?Date of Provider Response 07/12/21  ?Time of Provider Response 2105  ?Document  ?Patient Outcome Not stable and remains on department  ? ?IV Hydralazine administered, no other prns ordered at this time.  ?

## 2021-07-12 NOTE — ED Notes (Signed)
This RN called 3E for [facility] information for handoff.  ?

## 2021-07-12 NOTE — Assessment & Plan Note (Addendum)
Patient was admitted to the cardiac ward and was placed on oral antihypertensive agents with good toleration.  ?No frank end organ damage, hypertensive urgency and not emergency.  ? ?Further work up with echocardiogram with preserved LV systolic function with EF 65 to 70%, with severe left ventricular hypertrophy. Preserved RV systolic function. Moderate dilatation of left atrial size. Severe mitral annular calcification, mild to moderate mitral stenosis.  ? ?Elevated troponin possible demand ischemia ?Cardiac CT with coronary calcium 204, 94th percentile for age-race and sex matched controls. Minimal changes in the left main, LAD and left circumflex arteries. Mitral annular calcification.  ? ?Blood pressure control with, metoprolol, losartan and HCTZ.  ? ?No current evidence of atrial fibrillation.  ?

## 2021-07-12 NOTE — ED Triage Notes (Signed)
Patient went to an urgent care and was told to go to the ED because she was in afib, hypertensive, has a UTI, and is hyperglycemic. Patient denies history of diabetes and afib, reports history of hypertension but no longer has PCP so she does not take medication for it. Patient is alert, oriented, and in no apparent distress at this time. ?

## 2021-07-12 NOTE — ED Notes (Signed)
This RN called admitting provider, Dr. Flossie Buffy to clarify goals for blood pressure. Per provider, target BP is 160/110 for the first 24 hrs. This RN will administer newly ordered medications and relay in report to receiving RN as well.  ?

## 2021-07-12 NOTE — Assessment & Plan Note (Addendum)
Troponin 295-->268 ?Started on IV heparin infusion in the ED but is downward trending so possibly demand ischemia to uncontrolled HTN and new CHF exac Continue to trend troponin overnight. If trend continues to go down, favor discontinuation of infusion but will defer to cardiology for further recommendation. ?-Mentions of atrial fibrillation seen on EKG which we are not able to access. EKG here with NSR with PVCs. Continue to monitor on telemetry overnight.  ?-cardiology recommends coronary CTA ?

## 2021-07-12 NOTE — Assessment & Plan Note (Addendum)
Patient with no signs of volume overload today. ?She had on dose of furosemide on admission ?Acute diastolic heart failure.  ? ?Echocardiogram with preserved LV systolic function and positive LVH. No wall motion abnormalities.  ?Continue blood pressure control with ARB and HCTZ. ?Added  Metoprolol with good toleration.  ?Follow up as outpatient.  ?

## 2021-07-12 NOTE — Consult Note (Addendum)
?Cardiology Consultation:  ? ?Patient ID: Karen Miller ?MRN: [id number]; DOB: 23-Nov-1960 ? ?Admit date: 07/12/2021 ?Date of Consult: 07/12/2021 ? ?PCP:  Pcp, No ?  ?[facility] Providers ?Cardiologist:  None   ? ? ?Patient Profile:  ? ?Karen Miller is a 62 y.o. female with a hx of HTN who is being seen 07/12/2021 for the evaluation of newly diagnosed Afib and elevated troponin at the request of Dr. Langston Masker. ? ?History of Present Illness:  ? ?Karen Miller in a 61 year old female with history of HTN and no known cardiac history who presented to urgent care today with generalized malaise and lightheadedness found to have new onset Afib as well as a UTI and hyperglycemia. She was referred to the ER for further evaluation. ? ?In the ER, labs notable for trop 295->268>282, Cr 0.71, HgB 16.6, plt 334. UA grossly positive. CTA showed no PE but was notable for mild cardiomegaly and LVH. Severe MAC. Likely mild pulmonary vascular congestion.  ECG showed SR with frequent PVCs, nonspecific ST-T wave changes. ? ?She states that started feeling bad Thursday but could not quite characterize and just endorses a poor appetite.  She woke up Friday and felt dizzy but denied any frank syncope. Checked her BSG and it was elevated so since she was feeling dizzy went to urgent care who sent her here as above. I was not able to find documentation of her urgent care visit or and ECG confirming AF. She denies chest pain, shortness of breath, nausea, vomiting, palpitations.  States she has chronic bilateral ankle edema.  She does not have a documented history of A-fib.  She states she walks 2 to 3 miles a day with her husband and denies any chest pain or shortness of breath. No smoking, etoh, or drugs.  Brother has murmur and ICD in 38s and sister had MI in her 62s. ? ? ?Past Medical History:  ?Diagnosis Date  ? Hypertension   ? ? ?History reviewed. No pertinent surgical history.  ? ?Home Medications:  ?Prior to Admission medications   ?Not  on File  ? ? ?Inpatient Medications: ?Scheduled Meds: ? furosemide  40 mg Intravenous Daily  ? [START ON 07/13/2021] insulin aspart  0-9 Units Subcutaneous TID WC  ? lisinopril  5 mg Oral Daily  ? ?Continuous Infusions: ? heparin 1,100 Units/hr (07/12/21 1920)  ? ?PRN Meds: ?hydrALAZINE, nitroGLYCERIN ? ?Allergies:   No Known Allergies ? ?Social History:   ?Social History  ? ?Socioeconomic History  ? Marital status: Married  ?  Spouse name: Not on file  ? Number of children: Not on file  ? Years of education: Not on file  ? Highest education level: Not on file  ?Occupational History  ? Not on file  ?Tobacco Use  ? Smoking status: Never  ? Smokeless tobacco: Not on file  ?Substance and Sexual Activity  ? Alcohol use: Not Currently  ? Drug use: Not Currently  ? Sexual activity: Not on file  ?Other Topics Concern  ? Not on file  ?Social History Narrative  ? Not on file  ? ?Social Determinants of Health  ? ?Financial Resource Strain: Not on file  ?Food Insecurity: Not on file  ?Transportation Needs: Not on file  ?Physical Activity: Not on file  ?Stress: Not on file  ?Social Connections: Not on file  ?Intimate Partner Violence: Not on file  ?  ?Family History:   ?History reviewed. No pertinent family history.  ? ?ROS:  ?Please see  the history of present illness.  ? ?All other ROS reviewed and negative.    ? ?Physical Exam/Data:  ? ?Vitals:  ? 07/12/21 2100 07/12/21 2115 07/12/21 2130 07/12/21 2200  ?BP:  (!) 177/83 (!) 193/89 (!) 198/84  ?Pulse: 100 (!) 102 (!) 101 (!) 105  ?Resp: (!) 21 (!) 21 (!) 30 (!) 25  ?Temp:      ?TempSrc:      ?SpO2: 99% 98% 96% 98%  ?Weight:      ?Height:      ? ? ?Intake/Output Summary (Last 24 hours) at 07/12/2021 2227 ?Last data filed at 07/12/2021 2215 ?Gross per 24 hour  ?Intake --  ?Output 800 ml  ?Net -800 ml  ? ? ?  07/12/2021  ?  6:50 PM 07/12/2021  ?  5:45 PM  ?Last 3 Weights  ?Weight (lbs) 265 lb 265 lb  ?Weight (kg) 120.203 kg 120.203 kg  ?   ?Body mass index is 44.1 kg/m?.  ?General:  Well  nourished, well developed, in no acute distress, obese ?HEENT: normal ?Neck: no JVD ?Vascular: No carotid bruits; Distal pulses 2+ bilaterally ?Cardiac:  normal S1, S2; RR, tachycardia; no murmur  ?Lungs:  clear to auscultation bilaterally, no wheezing, rhonchi or rales  ?Abd: soft, nontender, no hepatomegaly  ?Ext: no edema ?Musculoskeletal:  No deformities, BUE and BLE strength normal and equal ?Skin: warm and dry  ?Neuro:  CNs 2-12 intact, no focal abnormalities noted ?Psych:  Normal affect  ? ?EKG:  The EKG was personally reviewed and demonstrates:  NSR, PVC, nonspecific ST changes  ?Telemetry:  Telemetry was personally reviewed and demonstrates:  ST and PVCs ? ?Relevant CV Studies: ?None  ? ?Laboratory Data: ? ?High Sensitivity Troponin:   ?Recent Labs  ?Lab 07/12/21 ?1204 07/12/21 ?1444 07/12/21 ?2020 07/12/21 ?2115  ?TROPONINIHS 295* 268* 282* 295*  ?   ?Chemistry ?Recent Labs  ?Lab 07/12/21 ?1204  ?NA 135  ?K 3.4*  ?CL 99  ?CO2 25  ?GLUCOSE 239*  ?BUN 10  ?CREATININE 0.71  ?CALCIUM 9.5  ?GFRNONAA >60  ?ANIONGAP 11  ?  ?Recent Labs  ?Lab 07/12/21 ?1204  ?PROT 8.6*  ?ALBUMIN 4.1  ?AST 30  ?ALT 29  ?ALKPHOS 99  ?BILITOT 0.8  ? ?Lipids  ?Recent Labs  ?Lab 07/12/21 ?2020  ?CHOL 231*  ?TRIG 118  ?HDL 40*  ?LDLCALC 167*  ?CHOLHDL 5.8  ?  ?Hematology ?Recent Labs  ?Lab 07/12/21 ?1204  ?WBC 11.4*  ?RBC 5.59*  ?HGB 16.6*  ?HCT 49.6*  ?MCV 88.7  ?MCH 29.7  ?MCHC 33.5  ?RDW 12.5  ?PLT 334  ? ?Thyroid No results for input(s): TSH, FREET4 in the last 168 hours.  ?BNP ?Recent Labs  ?Lab 07/12/21 ?2020  ?BNP 169.5*  ?  ?DDimer No results for input(s): DDIMER in the last 168 hours. ? ? ?Radiology/Studies:  ?DG Chest 2 View ? ?Result Date: 07/12/2021 ?CLINICAL DATA:  Chest pain EXAM: CHEST - 2 VIEW COMPARISON:  Radiograph 03/29/2012 FINDINGS: Unchanged cardiomediastinal silhouette. There is no focal airspace consolidation. There are mild interstitial opacities. There is no pleural effusion. No pneumothorax. Chronic right upper  rib injuries. Thoracic spondylosis. IMPRESSION: Mild interstitial opacities could reflect interstitial edema. Electronically Signed   By: Maurine Simmering M.D.   On: 07/12/2021 11:56  ? ?CT Angio Chest PE W and/or Wo Contrast ? ?Result Date: 07/12/2021 ?CLINICAL DATA:  Pulmonary embolism (PE) suspected, high prob EXAM: CT ANGIOGRAPHY CHEST WITH CONTRAST TECHNIQUE: Multidetector CT imaging of the chest was  performed using the standard protocol during bolus administration of intravenous contrast. Multiplanar CT image reconstructions and MIPs were obtained to evaluate the vascular anatomy. RADIATION DOSE REDUCTION: This exam was performed according to the departmental dose-optimization program which includes automated exposure control, adjustment of the mA and/or kV according to patient size and/or use of iterative reconstruction technique. CONTRAST:  59m OMNIPAQUE IOHEXOL 350 MG/ML SOLN COMPARISON:  Correlation is made with chest x-ray on the same day earlier FINDINGS: Cardiovascular: Mild cardiomegaly. Left ventricular hypertrophy. There are extensive coarse annular calcification seen at the mitral valve. Main pulmonary artery and its branches up to the segmental divisions are well opacified and have a normal appearance without evidence of PE. Mediastinum/Nodes: Unremarkable Lungs/Pleura: No focal consolidation, pleural effusion seen. Patchy ground-glass opacities, suspicious for vascular congestion Upper Abdomen: Unremarkable Musculoskeletal: Unremarkable Review of the MIP images confirms the above findings. IMPRESSION: Mild cardiomegaly and left ventricular hypertrophy. Severe mitral annular calcifications. No PE seen. No pleural effusion. Likely mild pulmonary vascular congestion. Electronically Signed   By: AFrazier RichardsM.D.   On: 07/12/2021 17:17   ? ? ?Assessment and Plan:  ? ?#Elevated Troponin: ?Patient presented with generalized malaise and lightheadedness found to have new Afib as well as elevated troponin to  the 200s but flat. Suspect demand ischemia. No known CAD, however, has several risk factors and has not followed regularly with PCP. Will plan for coronary CTA as well as TTE for further evaluation.  ?-Ch

## 2021-07-12 NOTE — Assessment & Plan Note (Addendum)
Patient with concentrated urine ?Continue antibiotic therapy with bactrim for 3 more days.  ?Urine culture positive for E Coli >100,000 CFU ?Sensitive to bactrim.  ?

## 2021-07-13 ENCOUNTER — Observation Stay (HOSPITAL_COMMUNITY): Payer: BC Managed Care – PPO

## 2021-07-13 DIAGNOSIS — E1169 Type 2 diabetes mellitus with other specified complication: Secondary | ICD-10-CM

## 2021-07-13 DIAGNOSIS — I110 Hypertensive heart disease with heart failure: Secondary | ICD-10-CM | POA: Diagnosis present

## 2021-07-13 DIAGNOSIS — R000 Tachycardia, unspecified: Secondary | ICD-10-CM | POA: Diagnosis present

## 2021-07-13 DIAGNOSIS — Z6841 Body Mass Index (BMI) 40.0 and over, adult: Secondary | ICD-10-CM | POA: Diagnosis not present

## 2021-07-13 DIAGNOSIS — I3481 Nonrheumatic mitral (valve) annulus calcification: Secondary | ICD-10-CM | POA: Diagnosis present

## 2021-07-13 DIAGNOSIS — I161 Hypertensive emergency: Secondary | ICD-10-CM | POA: Diagnosis present

## 2021-07-13 DIAGNOSIS — E1165 Type 2 diabetes mellitus with hyperglycemia: Secondary | ICD-10-CM | POA: Diagnosis present

## 2021-07-13 DIAGNOSIS — E785 Hyperlipidemia, unspecified: Secondary | ICD-10-CM

## 2021-07-13 DIAGNOSIS — N390 Urinary tract infection, site not specified: Secondary | ICD-10-CM | POA: Diagnosis present

## 2021-07-13 DIAGNOSIS — E876 Hypokalemia: Secondary | ICD-10-CM | POA: Diagnosis present

## 2021-07-13 DIAGNOSIS — R319 Hematuria, unspecified: Secondary | ICD-10-CM

## 2021-07-13 DIAGNOSIS — R079 Chest pain, unspecified: Secondary | ICD-10-CM

## 2021-07-13 DIAGNOSIS — I700 Atherosclerosis of aorta: Secondary | ICD-10-CM | POA: Diagnosis not present

## 2021-07-13 DIAGNOSIS — Z8249 Family history of ischemic heart disease and other diseases of the circulatory system: Secondary | ICD-10-CM | POA: Diagnosis not present

## 2021-07-13 DIAGNOSIS — I493 Ventricular premature depolarization: Secondary | ICD-10-CM | POA: Diagnosis present

## 2021-07-13 DIAGNOSIS — I5031 Acute diastolic (congestive) heart failure: Secondary | ICD-10-CM | POA: Diagnosis present

## 2021-07-13 DIAGNOSIS — I509 Heart failure, unspecified: Secondary | ICD-10-CM | POA: Diagnosis not present

## 2021-07-13 DIAGNOSIS — B9620 Unspecified Escherichia coli [E. coli] as the cause of diseases classified elsewhere: Secondary | ICD-10-CM | POA: Diagnosis present

## 2021-07-13 DIAGNOSIS — I4891 Unspecified atrial fibrillation: Secondary | ICD-10-CM | POA: Diagnosis present

## 2021-07-13 DIAGNOSIS — I160 Hypertensive urgency: Secondary | ICD-10-CM | POA: Diagnosis not present

## 2021-07-13 LAB — CBC
HCT: 43.5 % (ref 36.0–46.0)
Hemoglobin: 15.3 g/dL — ABNORMAL HIGH (ref 12.0–15.0)
MCH: 30.8 pg (ref 26.0–34.0)
MCHC: 35.2 g/dL (ref 30.0–36.0)
MCV: 87.5 fL (ref 80.0–100.0)
Platelets: 303 10*3/uL (ref 150–400)
RBC: 4.97 MIL/uL (ref 3.87–5.11)
RDW: 12.9 % (ref 11.5–15.5)
WBC: 10.8 10*3/uL — ABNORMAL HIGH (ref 4.0–10.5)
nRBC: 0 % (ref 0.0–0.2)

## 2021-07-13 LAB — ECHOCARDIOGRAM COMPLETE
Area-P 1/2: 5.38 cm2
Calc EF: 63 %
Height: 65 in
MV VTI: 1.2 cm2
S' Lateral: 2.9 cm
Single Plane A2C EF: 66.8 %
Single Plane A4C EF: 61.7 %
Weight: 4081.6 oz

## 2021-07-13 LAB — GLUCOSE, CAPILLARY
Glucose-Capillary: 285 mg/dL — ABNORMAL HIGH (ref 70–99)
Glucose-Capillary: 214 mg/dL — ABNORMAL HIGH (ref 70–99)
Glucose-Capillary: 204 mg/dL — ABNORMAL HIGH (ref 70–99)
Glucose-Capillary: 253 mg/dL — ABNORMAL HIGH (ref 70–99)

## 2021-07-13 LAB — BASIC METABOLIC PANEL
Anion gap: 12 (ref 5–15)
BUN: 14 mg/dL (ref 6–20)
CO2: 26 mmol/L (ref 22–32)
Calcium: 9.3 mg/dL (ref 8.9–10.3)
Chloride: 98 mmol/L (ref 98–111)
Creatinine, Ser: 0.77 mg/dL (ref 0.44–1.00)
GFR, Estimated: >60 mL/min (ref >60)
Glucose, Bld: 295 mg/dL — ABNORMAL HIGH (ref 70–99)
Potassium: 3.1 mmol/L — ABNORMAL LOW (ref 3.5–5.1)
Sodium: 136 mmol/L (ref 135–145)

## 2021-07-13 LAB — HEPARIN LEVEL (UNFRACTIONATED)
Heparin Unfractionated: <0.1 IU/mL — ABNORMAL LOW (ref 0.30–0.70)
Heparin Unfractionated: 0.15 IU/mL — ABNORMAL LOW (ref 0.30–0.70)

## 2021-07-13 MED ORDER — HEPARIN BOLUS VIA INFUSION
2000.0000 [IU] | Freq: Once | INTRAVENOUS | Status: AC
  Administered 2021-07-13: 2000 [IU] via INTRAVENOUS
  Filled 2021-07-13: qty 2000

## 2021-07-13 MED ORDER — CEPHALEXIN 250 MG PO CAPS
500.0000 mg | ORAL_CAPSULE | Freq: Three times a day (TID) | ORAL | Status: DC
  Administered 2021-07-13 – 2021-07-14 (×3): 500 mg via ORAL
  Filled 2021-07-13 (×3): qty 2

## 2021-07-13 MED ORDER — HYDROCHLOROTHIAZIDE 25 MG PO TABS
25.0000 mg | ORAL_TABLET | Freq: Every day | ORAL | Status: DC
  Administered 2021-07-13 – 2021-07-15 (×3): 25 mg via ORAL
  Filled 2021-07-13 (×3): qty 1

## 2021-07-13 MED ORDER — INSULIN ASPART 100 UNIT/ML IJ SOLN
0.0000 [IU] | Freq: Three times a day (TID) | INTRAMUSCULAR | Status: DC
  Administered 2021-07-13: 3 [IU] via SUBCUTANEOUS
  Administered 2021-07-14: 2 [IU] via SUBCUTANEOUS
  Administered 2021-07-14 – 2021-07-15 (×3): 3 [IU] via SUBCUTANEOUS
  Administered 2021-07-15 (×2): 2 [IU] via SUBCUTANEOUS

## 2021-07-13 MED ORDER — LOSARTAN POTASSIUM 50 MG PO TABS
50.0000 mg | ORAL_TABLET | Freq: Every day | ORAL | Status: DC
  Administered 2021-07-14 – 2021-07-15 (×2): 50 mg via ORAL
  Filled 2021-07-13 (×2): qty 1

## 2021-07-13 NOTE — Progress Notes (Signed)
?Progress Note ? ? ?Patient: Karen Miller FTD:[id number] DOB: 27-Jan-1961 DOA: 07/12/2021     0 ?DOS: the patient was seen and examined on 07/13/2021 ?  ?Brief hospital course: ?[name] was admitted to the hospital with the working diagnosis of hypertensive emergency in the setting of urine infection.  ? ?61 yo female with the past medical history of hypertension, and obesity who presented to urgent care not feeling well, she was diagnosed with UTI and atrial fibrillation, transferred to [facility] for further management. No chest pain, no palpitations, no dyspnea, PND or orthopnea. On her initial physical examination blood pressure 194/120, HR 105, RR 25 and 02 saturation 98%, lungs with no wheezing or rales, heart with S1 and S2 present regular, abdomen not distended and no lower extremity edema,  ? ?Na 135, K 3,4 Cl 99, bicarbonate 25, glucose 239 bun 10 cr 0,71  ?High sensitive troponin 295, 268, 282  ?Wbc 11,4 hgb 16,6 plt 334  ?UA >50 wbc, >50 RBC, SG 1,020, 100 protein  ? ?Chest radiograph with mild cardiomegaly and mild hilar vascular congestion with no infiltrates ? ?EKG 125 bpm, normal axis, normal intervals, sinus rhythm, with multiple PVC, no significant ST segment or T wave changes.  ? ?Assessment and Plan: ?* Hypertensive emergency ?Systolic blood pressure 062 to 177 mmHg ? ?Echocardiogram with preserved LV systolic function with EF 65 to 70%, with severe left ventricular hypertrophy. Preserved RV systolic function. Moderate dilatation of left atrial size. Severe mitral annular calcification, mild to moderate stenosis.  ? ?Elevated troponin possible demand ischemia, will need ischemic work up as outpatient.  ?Hold on heparin for now.  ? ?Will place patient on losartan and HCTZ for blood pressure control. ?May need B blocker for tachycardia.  ? ?No current evidence of atrial fibrillation, continue telemetry monitoring.  ? ?Acute CHF (congestive heart failure) ([place]) ?Patient with no signs of volume overload  today. ?She had on dose of furosemide on admission ? ?Echocardiogram with preserved LV systolic function and positive LVH. No wall motion abnormalities.  ?Continue blood pressure control.  ? ?UTI (urinary tract infection) ?Patient with concentrated urine ?Will plan for short course antibiotic therapy with cephalexin  ?Follow up on cultures and temperature curve  ?No sepsis.  ? ?Type 2 diabetes mellitus with hyperlipidemia ([place]) ?New diagnosis of T2DM ? ?Fasting glucose is 295. ?Will place patient on insulin sliding scale for glucose cover and monitoring.  ?Start low dose metformin at discharge.  ? ?Dyslipidemia, continue with statin therapy.  ? ?Obesity, Class III, BMI 40-49.9 (morbid obesity) ([place]) ?Calculated BMI is 42.  ? ? ? ? ?  ? ?Subjective: Patient with no chest pain, no dyspnea or palpitation, concentrated urine but no dysuria  ? ?Physical Exam: ?Vitals:  ? 07/13/21 0055 07/13/21 0155 07/13/21 0405 07/13/21 0800  ?BP: (!) 150/83 (!) 143/85 (!) 143/60 (!) 177/85  ?Pulse: 90 (!) 50 97 (!) 110  ?Resp: (!) '21 20 20 19  '$ ?Temp: 98 ?F (36.7 ?C) 97.9 ?F (36.6 ?C) 97.9 ?F (36.6 ?C) 98.4 ?F (36.9 ?C)  ?TempSrc: Oral Oral Oral Oral  ?SpO2: 95% 97% 97% 97%  ?Weight:   115.7 kg   ?Height:      ? ?Neurology awake and alert ?ENT with no pallor ?Cardiovascular with S1 and S2 present and regular, no gallops, rubs or murmurs ?Respiratory with no rales or wheezing  ?Abdomen not distended ?No pitting lower extremity edema  ?Data Reviewed: ? ? ? ?Family Communication: no family at the bedside  ? ?  Disposition: ?Status is: Observation ?The patient will require care spanning > 2 midnights and should be moved to inpatient because: uncontrolled hypertension  ? Planned Discharge Destination: Home ? ? ? ? ? ?Author: ?Tawni Millers, MD ?07/13/2021 1:35 PM ? ?For on call review [email or web address].  ?

## 2021-07-13 NOTE — Progress Notes (Signed)
ANTICOAGULATION CONSULT NOTE  ?Pharmacy Consult for Heparin ?Indication: atrial fibrillation ?Brief A/P: Heparin level subtherapeutic Increase Heparin rate ? ?No Known Allergies ? ?Patient Measurements: ?Height: 5'5" (165.1 cm) ?Weight: 120.45 kg (265 lb) ?IBW/kg (Calculated): 57 kg  ?Heparin Dosing Weight: 86 kg  ? ?Vital Signs: ?Temp: 97.9 ?F (36.6 ?C) (05/06 0155) ?Temp Source: Oral (05/06 0155) ?BP: 143/85 (05/06 0155) ?Pulse Rate: 50 (05/06 0155) ? ?Labs: ?Recent Labs  ?  07/12/21 ?1204 07/12/21 ?1444 07/12/21 ?2020 07/12/21 ?2115 07/13/21 ?0105  ?HGB 16.6*  --   --   --  15.3*  ?HCT 49.6*  --   --   --  43.5  ?PLT 334  --   --   --  303  ?HEPARINUNFRC  --   --   --   --  <0.10*  ?CREATININE 0.71  --   --   --  0.77  ?TROPONINIHS 295* 268* 282* 295*  --   ? ? ? ?Estimated Creatinine Clearance: 97.2 mL/min (by C-G formula based on SCr of 0.77 mg/dL). ? ?Assessment: ?61 y.o. female with Afib and elevated troponin for heparin ? ?Goal of Therapy:  ?Heparin level 0.3-0.7 units/ml ?Monitor platelets by anticoagulation protocol: Yes ?  ?Plan:  ?Heparin 2000 units IV bolus, then increase heparin 1450 units/hr ?Check heparin level in 8 hours.  ? ?Phillis Knack, PharmD, BCPS ?  ? ? ? ?

## 2021-07-13 NOTE — Progress Notes (Signed)
?  Echocardiogram ?2D Echocardiogram has been performed. ? ?Karen Miller ?07/13/2021, 10:39 AM ?

## 2021-07-13 NOTE — Progress Notes (Addendum)
ANTICOAGULATION CONSULT NOTE  ?Pharmacy Consult for Heparin ?Indication: atrial fibrillation ?Brief A/P: Heparin level subtherapeutic Increase Heparin rate ? ?No Known Allergies ? ?Patient Measurements: ?Height: 5'5" (165.1 cm) ?Weight: 120.45 kg (265 lb) ?IBW/kg (Calculated): 57 kg  ?Heparin Dosing Weight: 86 kg  ? ?Vital Signs: ?Temp: 98.4 ?F (36.9 ?C) (05/06 0800) ?Temp Source: Oral (05/06 0800) ?BP: 177/85 (05/06 0800) ?Pulse Rate: 110 (05/06 0800) ? ?Labs: ?Recent Labs  ?  07/12/21 ?1204 07/12/21 ?1444 07/12/21 ?2020 07/12/21 ?2115 07/13/21 ?0105 07/13/21 ?0950  ?HGB 16.6*  --   --   --  15.3*  --   ?HCT 49.6*  --   --   --  43.5  --   ?PLT 334  --   --   --  303  --   ?HEPARINUNFRC  --   --   --   --  <0.10* 0.15*  ?CREATININE 0.71  --   --   --  0.77  --   ?TROPONINIHS 295* 268* 282* 295*  --   --   ? ? ? ?Estimated Creatinine Clearance: 95 mL/min (by C-G formula based on SCr of 0.77 mg/dL). ? ?Assessment: ?61 y.o. female with Afib and elevated troponin consulted to dose heparin. Heparin level subtherapeutic at 0.15. No issues with the line reported. CBC stable. ? ?Goal of Therapy:  ?Heparin level 0.3-0.7 units/ml ?Monitor platelets by anticoagulation protocol: Yes ?  ?Plan:  ?Heparin 2000 units IV bolus, then increase heparin to 1750 units/hr ?Check heparin level in 6 hours.  ? ?Thank you for allowing pharmacy to participate in this patient's care. ? ?Reatha Harps, PharmD ?PGY1 Pharmacy Resident ?07/13/2021 11:23 AM ?Check [email or web address] for unit specific pharmacy number ? ?  ? ? ? ?

## 2021-07-13 NOTE — Hospital Course (Addendum)
[name] was admitted to the hospital with the working diagnosis of hypertensive emergency in the setting of urine infection.  ? ?61 yo female with the past medical history of hypertension, and obesity who presented to urgent care not feeling well, she was diagnosed with UTI and possible atrial fibrillation, transferred to [facility] for further management. No chest pain, no palpitations, no dyspnea, PND or orthopnea. On her initial physical examination blood pressure 194/120, HR 105, RR 25 and 02 saturation 98%, lungs with no wheezing or rales, heart with S1 and S2 present regular, abdomen not distended and no lower extremity edema,  ? ?Na 135, K 3,4 Cl 99, bicarbonate 25, glucose 239 bun 10 cr 0,71  ?High sensitive troponin 295, 268, 282  ?Wbc 11,4 hgb 16,6 plt 334  ?UA >50 wbc, >50 RBC, SG 1,020, 100 protein  ? ?Chest radiograph with mild cardiomegaly and mild hilar vascular congestion with no infiltrates ? ?EKG 125 bpm, normal axis, normal intervals, sinus rhythm, with multiple PVC, no significant ST segment or T wave changes.  ? ?Patient was placed on telemetry monitoring and remained on sinus rhythm. ?Atrial fibrillation was ruled out.  ?Troponin flat with, acute coronary syndrome was ruled out.  ? ?Patient was placed on oral antihypertensive agents with improvement in blood pressure. ?Will continue antibiotic therapy for 3 more days.  ?

## 2021-07-13 NOTE — Progress Notes (Addendum)
? ?Progress Note ? ?Patient Name: Karen Miller ?Date of Encounter: 07/13/2021 ? ?[facility] Cardiologist: None New - Dr. Margaretann Loveless ? ?Subjective  ? ?Feeling better, eating and drinking after 3 days of poor oral intake and feeling "yucky" ? ?Inpatient Medications  ?  ?Scheduled Meds: ? insulin aspart  0-9 Units Subcutaneous TID WC  ? lisinopril  5 mg Oral Daily  ? ?Continuous Infusions: ? heparin 1,450 Units/hr (07/13/21 0223)  ? ?PRN Meds: ?hydrALAZINE, nitroGLYCERIN  ? ?Vital Signs  ?  ?Vitals:  ? 07/13/21 0055 07/13/21 0155 07/13/21 0405 07/13/21 0800  ?BP: (!) 150/83 (!) 143/85 (!) 143/60 (!) 177/85  ?Pulse: 90 (!) 50 97 (!) 110  ?Resp: (!) _0 ?Temp: 98 ?F (36.7 ?C) 97.9 ?F (36.6 ?C) 97.9 ?F (36.6 ?C) 98.4 ?F (36.9 ?C)  ?TempSrc: Oral Oral Oral Oral  ?SpO2: 95% 97% 97% 97%  ?Weight:   115.7 kg   ?Height:      ? ? ?Intake/Output Summary (Last 24 hours) at 07/13/2021 0825 ?Last data filed at 07/13/2021 0600 ?Gross per 24 hour  ?Intake 308 ml  ?Output 1150 ml  ?Net -842 ml  ? ? ?  07/13/2021  ?  4:05 AM 07/12/2021  ?  8:57 PM 07/12/2021  ?  6:50 PM  ?Last 3 Weights  ?Weight (lbs) 255 lb 1.6 oz 257 lb 8 oz 265 lb  ?Weight (kg) 115.713 kg 116.801 kg 120.203 kg  ?   ? ?Telemetry  ?  ?Sinus tachycardia rates 115 bpm - Personally Reviewed ? ?ECG  ?  ?Sinus tach, PVCs - Personally Reviewed ? ?Physical Exam  ? ?GEN: No acute distress.   ?Neck: No JVD ?Cardiac: regular rhythm, tachycardic, no murmur ?Respiratory: Clear to auscultation bilaterally. ?GI: Soft, nontender, non-distended  ?MS: Trace edema; No deformity. ?Neuro:  Nonfocal  ?Psych: Normal affect  ? ?Labs  ?  ?High Sensitivity Troponin:   ?Recent Labs  ?Lab 07/12/21 ?1204 07/12/21 ?1444 07/12/21 ?2020 07/12/21 ?2115  ?TROPONINIHS 295* 268* 282* 295*  ?   ?Chemistry ?Recent Labs  ?Lab 07/12/21 ?1204 07/13/21 ?0105  ?NA 135 136  ?K 3.4* 3.1*  ?CL 99 98  ?CO2 25 26  ?GLUCOSE 239* 295*  ?BUN 10 14  ?CREATININE 0.71 0.77  ?CALCIUM 9.5 9.3  ?PROT 8.6*  --   ?ALBUMIN  4.1  --   ?AST 30  --   ?ALT 29  --   ?ALKPHOS 99  --   ?BILITOT 0.8  --   ?GFRNONAA >60 >60  ?ANIONGAP 11 12  ?  ?Lipids  ?Recent Labs  ?Lab 07/12/21 ?2020  ?CHOL 231*  ?TRIG 118  ?HDL 40*  ?LDLCALC 167*  ?CHOLHDL 5.8  ?  ?Hematology ?Recent Labs  ?Lab 07/12/21 ?1204 07/13/21 ?0105  ?WBC 11.4* 10.8*  ?RBC 5.59* 4.97  ?HGB 16.6* 15.3*  ?HCT 49.6* 43.5  ?MCV 88.7 87.5  ?MCH 29.7 30.8  ?MCHC 33.5 35.2  ?RDW 12.5 12.9  ?PLT 334 303  ? ?Thyroid No results for input(s): TSH, FREET4 in the last 168 hours.  ?BNP ?Recent Labs  ?Lab 07/12/21 ?2020  ?BNP 169.5*  ?  ?DDimer No results for input(s): DDIMER in the last 168 hours.  ? ?Radiology  ?  ?DG Chest 2 View ? ?Result Date: 07/12/2021 ?CLINICAL DATA:  Chest pain EXAM: CHEST - 2 VIEW COMPARISON:  Radiograph 03/29/2012 FINDINGS: Unchanged cardiomediastinal silhouette. There is no focal airspace consolidation. There are mild interstitial opacities. There is no pleural effusion.  No pneumothorax. Chronic right upper rib injuries. Thoracic spondylosis. IMPRESSION: Mild interstitial opacities could reflect interstitial edema. Electronically Signed   By: Maurine Simmering M.D.   On: 07/12/2021 11:56  ? ?CT Angio Chest PE W and/or Wo Contrast ? ?Result Date: 07/12/2021 ?CLINICAL DATA:  Pulmonary embolism (PE) suspected, high prob EXAM: CT ANGIOGRAPHY CHEST WITH CONTRAST TECHNIQUE: Multidetector CT imaging of the chest was performed using the standard protocol during bolus administration of intravenous contrast. Multiplanar CT image reconstructions and MIPs were obtained to evaluate the vascular anatomy. RADIATION DOSE REDUCTION: This exam was performed according to the departmental dose-optimization program which includes automated exposure control, adjustment of the mA and/or kV according to patient size and/or use of iterative reconstruction technique. CONTRAST:  75m OMNIPAQUE IOHEXOL 350 MG/ML SOLN COMPARISON:  Correlation is made with chest x-ray on the same day earlier FINDINGS:  Cardiovascular: Mild cardiomegaly. Left ventricular hypertrophy. There are extensive coarse annular calcification seen at the mitral valve. Main pulmonary artery and its branches up to the segmental divisions are well opacified and have a normal appearance without evidence of PE. Mediastinum/Nodes: Unremarkable Lungs/Pleura: No focal consolidation, pleural effusion seen. Patchy ground-glass opacities, suspicious for vascular congestion Upper Abdomen: Unremarkable Musculoskeletal: Unremarkable Review of the MIP images confirms the above findings. IMPRESSION: Mild cardiomegaly and left ventricular hypertrophy. Severe mitral annular calcifications. No PE seen. No pleural effusion. Likely mild pulmonary vascular congestion. Electronically Signed   By: AFrazier RichardsM.D.   On: 07/12/2021 17:17   ? ?Cardiac Studies  ? ?Echo pending ? ?Patient Profile  ?   ?61y.o. female with no known past medical history due to not seeing a physician regularly who presents with hypertensive emergency with elevated troponin and acute diastolic heart failure.  She was found to have atrial fibrillation at urgent care, however this cannot be confirmed with ECG or telemetry strips.  She has sinus tachycardia on presentation. ? ?Assessment & Plan  ?  ?Hypertensive emergency ?Elevated troponin ?Severe mitral annular calcifications ?Acute pulmonary edema ?-Presents with hypertensive emergency and likely longstanding hypertension that has been untreated evidenced by LVH on echocardiogram.  Requires treatment for hypertension and has received labetalol as well as starting lisinopril.  Blood pressure has improved mildly but requires further up titration of therapy. ?-She demonstrates sinus tachycardia today, query whether this was interpreted as atrial fibrillation at a faster rate while she was at urgent care, it is unclear with no telemetry strips to review.  Suspect poor p.o. intake, I have encouraged oral rehydration, there are signs of  pulmonary vascular congestion on CT, I wonder if this is in the setting of hypertensive emergency with acute diastolic dysfunction.  She did receive 1 dose of Lasix with only a modest response, her IVC is small and collapsible on echo with RA pressure estimate of 3 mmHg, would not plan for further diuresis at this time. ?-No PE on CT PE. ?-We will consider adding a beta-blocker tomorrow after patient rehydrates a bit.  She has evidence of severe mitral annular calcification with an estimated gradient at a fast heart rate of 8 mmHg.  Though she is not having chest pain or shortness of breath, she will benefit from a slower heart rate with regard to her mitral valve hemodynamics.  This will also facilitate lowering her heart rate for anticipated coronary CTA, heart rate will need to be around 50-60 beats a minute to obtain a high-quality coronary CTA.  If this is not achieved prior to hospital dismissal can  consider close outpatient CCTA versus cardiac PET/CT perfusion study.  I will plan to see her in my clinic to coordinate when she is ready for dismissal. ?- Formal echo report pending today, however reviewed preliminarily. Mild-moderate MS (valve area 1.13 cm2, DVI 0.4, MG 8 mmHg at HR 103), there is a mobile echodensity on the mitral valve. 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  ?   ? ?For questions or updates, please contact [place] ?Please consult [email or web address] for contact info under  ? ?  ?   ?Signed, ?Elouise Munroe, MD  ?07/13/2021, 8:25 AM   ? ?

## 2021-07-14 LAB — BASIC METABOLIC PANEL
Anion gap: 12 (ref 5–15)
BUN: 21 mg/dL — ABNORMAL HIGH (ref 6–20)
CO2: 24 mmol/L (ref 22–32)
Calcium: 9.2 mg/dL (ref 8.9–10.3)
Chloride: 99 mmol/L (ref 98–111)
Creatinine, Ser: 0.88 mg/dL (ref 0.44–1.00)
GFR, Estimated: >60 mL/min (ref >60)
Glucose, Bld: 214 mg/dL — ABNORMAL HIGH (ref 70–99)
Potassium: 3 mmol/L — ABNORMAL LOW (ref 3.5–5.1)
Sodium: 135 mmol/L (ref 135–145)

## 2021-07-14 LAB — CBC
HCT: 42.4 % (ref 36.0–46.0)
Hemoglobin: 14.3 g/dL (ref 12.0–15.0)
MCH: 30.2 pg (ref 26.0–34.0)
MCHC: 33.7 g/dL (ref 30.0–36.0)
MCV: 89.5 fL (ref 80.0–100.0)
Platelets: 294 10*3/uL (ref 150–400)
RBC: 4.74 MIL/uL (ref 3.87–5.11)
RDW: 13.1 % (ref 11.5–15.5)
WBC: 9.9 10*3/uL (ref 4.0–10.5)
nRBC: 0 % (ref 0.0–0.2)

## 2021-07-14 LAB — GLUCOSE, CAPILLARY
Glucose-Capillary: 176 mg/dL — ABNORMAL HIGH (ref 70–99)
Glucose-Capillary: 193 mg/dL — ABNORMAL HIGH (ref 70–99)
Glucose-Capillary: 219 mg/dL — ABNORMAL HIGH (ref 70–99)
Glucose-Capillary: 237 mg/dL — ABNORMAL HIGH (ref 70–99)
Glucose-Capillary: 241 mg/dL — ABNORMAL HIGH (ref 70–99)

## 2021-07-14 MED ORDER — POTASSIUM CHLORIDE CRYS ER 20 MEQ PO TBCR
40.0000 meq | EXTENDED_RELEASE_TABLET | ORAL | Status: AC
  Administered 2021-07-14 (×2): 40 meq via ORAL
  Filled 2021-07-14 (×2): qty 2

## 2021-07-14 MED ORDER — METOPROLOL TARTRATE 25 MG PO TABS
25.0000 mg | ORAL_TABLET | Freq: Two times a day (BID) | ORAL | Status: DC
  Administered 2021-07-14 – 2021-07-15 (×3): 25 mg via ORAL
  Filled 2021-07-14 (×3): qty 1

## 2021-07-14 MED ORDER — METFORMIN HCL 500 MG PO TABS
500.0000 mg | ORAL_TABLET | Freq: Every day | ORAL | Status: DC
Start: 2021-07-15 — End: 2021-07-15

## 2021-07-14 MED ORDER — ATORVASTATIN CALCIUM 40 MG PO TABS
40.0000 mg | ORAL_TABLET | Freq: Every day | ORAL | Status: DC
  Administered 2021-07-14 – 2021-07-15 (×2): 40 mg via ORAL
  Filled 2021-07-14 (×2): qty 1

## 2021-07-14 MED ORDER — SULFAMETHOXAZOLE-TRIMETHOPRIM 800-160 MG PO TABS
1.0000 | ORAL_TABLET | Freq: Two times a day (BID) | ORAL | Status: DC
  Administered 2021-07-14 – 2021-07-15 (×3): 1 via ORAL
  Filled 2021-07-14 (×4): qty 1

## 2021-07-14 NOTE — Plan of Care (Signed)

## 2021-07-14 NOTE — Progress Notes (Signed)
? ?Progress Note ? ?Patient Name: Karen Miller ?Date of Encounter: 07/14/2021 ? ?[facility] Cardiologist: Elouise Munroe, MD  ? ?Subjective  ? ?Feeling better today, no longer dizzy.  ? ?Inpatient Medications  ?  ?Scheduled Meds: ? atorvastatin  40 mg Oral Daily  ? hydrochlorothiazide  25 mg Oral Daily  ? insulin aspart  0-9 Units Subcutaneous TID WC  ? losartan  50 mg Oral Daily  ? [START ON 07/15/2021] metFORMIN  500 mg Oral Q breakfast  ? metoprolol tartrate  25 mg Oral BID  ? potassium chloride  40 mEq Oral Q4H  ? sulfamethoxazole-trimethoprim  1 tablet Oral Q12H  ? ?Continuous Infusions: ? ? ?PRN Meds: ?hydrALAZINE, nitroGLYCERIN  ? ?Vital Signs  ?  ?Vitals:  ? 07/14/21 0015 07/14/21 0557 07/14/21 0802 07/14/21 1130  ?BP: (!) 169/69 (!) 168/78 (!) 157/78 (!) 178/98  ?Pulse: 88 97 (!) 101 95  ?Resp: 20 (!) 22 (!) 24 (!) 22  ?Temp: 98.1 ?F (36.7 ?C) 98.1 ?F (36.7 ?C)  98.2 ?F (36.8 ?C)  ?TempSrc: Oral Oral  Oral  ?SpO2: 98% 99% 94% 95%  ?Weight:  116.7 kg    ?Height:      ? ? ?Intake/Output Summary (Last 24 hours) at 07/14/2021 1133 ?Last data filed at 07/14/2021 0857 ?Gross per 24 hour  ?Intake 308.94 ml  ?Output 800 ml  ?Net -491.06 ml  ? ? ?  07/14/2021  ?  5:57 AM 07/13/2021  ?  4:05 AM 07/12/2021  ?  8:57 PM  ?Last 3 Weights  ?Weight (lbs) 257 lb 3.2 oz 255 lb 1.6 oz 257 lb 8 oz  ?Weight (kg) 116.665 kg 115.713 kg 116.801 kg  ?   ? ?Telemetry  ?  ?Sinus rhythm/ ST rates 90s to low 100 - Personally Reviewed ? ?ECG  ?  ?Sinus tach, PVCs - Personally Reviewed ? ?Physical Exam  ? ?GEN: No acute distress.   ?Neck: No JVD ?Cardiac: regular rhythm, normal rate, no murmur ?Respiratory: Clear to auscultation bilaterally. ?GI: Soft, nontender, non-distended  ?MS: Trace edema; No deformity. ?Neuro:  Nonfocal  ?Psych: Normal affect  ? ?Labs  ?  ?High Sensitivity Troponin:   ?Recent Labs  ?Lab 07/12/21 ?1204 07/12/21 ?1444 07/12/21 ?2020 07/12/21 ?2115  ?TROPONINIHS 295* 268* 282* 295*  ?   ?Chemistry ?Recent Labs  ?Lab  07/12/21 ?1204 07/13/21 ?0105 07/14/21 ?2836  ?NA 135 136 135  ?K 3.4* 3.1* 3.0*  ?CL 99 98 99  ?CO2 _0 ?GLUCOSE 239* 295* 214*  ?BUN 10 14 21*  ?CREATININE 0.71 0.77 0.88  ?CALCIUM 9.5 9.3 9.2  ?PROT 8.6*  --   --   ?ALBUMIN 4.1  --   --   ?AST 30  --   --   ?ALT 29  --   --   ?ALKPHOS 99  --   --   ?BILITOT 0.8  --   --   ?GFRNONAA >60 >60 >60  ?ANIONGAP _1 ?  ?Lipids  ?Recent Labs  ?Lab 07/12/21 ?2020  ?CHOL 231*  ?TRIG 118  ?HDL 40*  ?LDLCALC 167*  ?CHOLHDL 5.8  ?  ?Hematology ?Recent Labs  ?Lab 07/12/21 ?1204 07/13/21 ?0105 07/14/21 ?6294  ?WBC 11.4* 10.8* 9.9  ?RBC 5.59* 4.97 4.74  ?HGB 16.6* 15.3* 14.3  ?HCT 49.6* 43.5 42.4  ?MCV 88.7 87.5 89.5  ?MCH 29.7 30.8 30.2  ?MCHC 33.5 35.2 33.7  ?RDW 12.5 12.9 13.1  ?PLT 334 303 294  ? ?  Thyroid No results for input(s): TSH, FREET4 in the last 168 hours.  ?BNP ?Recent Labs  ?Lab 07/12/21 ?2020  ?BNP 169.5*  ?  ?DDimer No results for input(s): DDIMER in the last 168 hours.  ? ?Radiology  ?  ?DG Chest 2 View ? ?Result Date: 07/12/2021 ?CLINICAL DATA:  Chest pain EXAM: CHEST - 2 VIEW COMPARISON:  Radiograph 03/29/2012 FINDINGS: Unchanged cardiomediastinal silhouette. There is no focal airspace consolidation. There are mild interstitial opacities. There is no pleural effusion. No pneumothorax. Chronic right upper rib injuries. Thoracic spondylosis. IMPRESSION: Mild interstitial opacities could reflect interstitial edema. Electronically Signed   By: Maurine Simmering M.D.   On: 07/12/2021 11:56  ? ?CT Angio Chest PE W and/or Wo Contrast ? ?Result Date: 07/12/2021 ?CLINICAL DATA:  Pulmonary embolism (PE) suspected, high prob EXAM: CT ANGIOGRAPHY CHEST WITH CONTRAST TECHNIQUE: Multidetector CT imaging of the chest was performed using the standard protocol during bolus administration of intravenous contrast. Multiplanar CT image reconstructions and MIPs were obtained to evaluate the vascular anatomy. RADIATION DOSE REDUCTION: This exam was performed according to the  departmental dose-optimization program which includes automated exposure control, adjustment of the mA and/or kV according to patient size and/or use of iterative reconstruction technique. CONTRAST:  42m OMNIPAQUE IOHEXOL 350 MG/ML SOLN COMPARISON:  Correlation is made with chest x-ray on the same day earlier FINDINGS: Cardiovascular: Mild cardiomegaly. Left ventricular hypertrophy. There are extensive coarse annular calcification seen at the mitral valve. Main pulmonary artery and its branches up to the segmental divisions are well opacified and have a normal appearance without evidence of PE. Mediastinum/Nodes: Unremarkable Lungs/Pleura: No focal consolidation, pleural effusion seen. Patchy ground-glass opacities, suspicious for vascular congestion Upper Abdomen: Unremarkable Musculoskeletal: Unremarkable Review of the MIP images confirms the above findings. IMPRESSION: Mild cardiomegaly and left ventricular hypertrophy. Severe mitral annular calcifications. No PE seen. No pleural effusion. Likely mild pulmonary vascular congestion. Electronically Signed   By: AFrazier RichardsM.D.   On: 07/12/2021 17:17  ? ?ECHOCARDIOGRAM COMPLETE ? ?Result Date: 07/13/2021 ?   ECHOCARDIOGRAM REPORT   Patient Name:   Karen RegisterDate of Exam: 07/13/2021 Medical Rec #:  [id number]      Height:       65.0 in Accession #:    [id number]     Weight:       255.1 lb Date of Birth:  707-14-1962      BSA:          2.194 m? Patient Age:    89years        BP:           177/85 mmHg Patient Gender: F               HR:           100 bpm. Exam Location:  Inpatient Procedure: 2D Echo, 3D Echo, Cardiac Doppler and Color Doppler Indications:    R07.9* Chest pain, unspecified  History:        Patient has no prior history of Echocardiogram examinations.                 CHF, Abnormal ECG, Arrythmias:Tachycardia; Risk                 Factors:Hypertension. Elevated troponin.  Sonographer:    TRoseanna RainbowRDCS Referring Phys: [id number]  Sonographer  Comments: Technically difficult study due to poor echo windows and patient is morbidly obese. Image acquisition challenging due  to patient body habitus. IMPRESSIONS  1. Left ventricular ejection fraction, by estimation, is 65 to 70%. The left ventricle has hyperdynamic function. The left ventricle has no regional wall motion abnormalities. There is severe left ventricular hypertrophy. Left ventricular diastolic parameters are indeterminate.  2. Right ventricular systolic function is normal. The right ventricular size is normal. Tricuspid regurgitation signal is inadequate for assessing PA pressure.  3. Left atrial size was moderately dilated.  4. Mean gradient 8.5 mmhg. can re-assess at lower heart rates. . The mitral valve is degenerative. Mild mitral valve regurgitation. Mild to moderate mitral stenosis. Severe mitral annular calcification.  5. The aortic valve was not well visualized. Aortic valve regurgitation is not visualized.  6. Aortic no significant aortic aneurysm.  7. The inferior vena cava is normal in size with greater than 50% respiratory variability, suggesting right atrial pressure of 3 mmHg. Comparison(s): No prior Echocardiogram. Conclusion(s)/Recommendation(s): Mobile echodensity on the posterior leaflet 1.2 x 0.5 cm. DDx includes mobile MAC v. vegetation. Recommend blood cultures. FINDINGS  Left Ventricle: Left ventricular ejection fraction, by estimation, is 65 to 70%. The left ventricle has hyperdynamic function. The left ventricle has no regional wall motion abnormalities. The left ventricular internal cavity size was normal in size. There is severe left ventricular hypertrophy. Left ventricular diastolic parameters are indeterminate. Right Ventricle: The right ventricular size is normal. Right ventricular systolic function is normal. Tricuspid regurgitation signal is inadequate for assessing PA pressure. Left Atrium: Left atrial size was moderately dilated. Right Atrium: Right atrial size was  normal in size. Pericardium: There is no evidence of pericardial effusion. Mitral Valve: Mean gradient 8.5 mmhg. can re-assess at lower heart rates. The mitral valve is degenerative in appearance. Severe mi

## 2021-07-14 NOTE — TOC Transition Note (Signed)
Transition of Care (TOC) - CM/SW Discharge Note ? ? ?Patient Details  ?Name: GISELLA ALWINE ?MRN: [id number] ?Date of Birth: 01-17-1961 ? ?Transition of Care (TOC) CM/SW Contact:  ?Carles Collet, RN ?Phone Number: ?07/14/2021, 11:01 AM ? ? ?Clinical Narrative:    ?Met with patient and spouse at bedside. Patient has insurance coverage and will be able to fill meds at DC. She states she will follow up initially with cardiology and they have agreed to see within the next 2 weeks, she does not have a PCP, she has been provided with the number to call to find a [facility] PCP on her AVS she understands she will need to make this call tomorrow when offices open.  ?No HH or DME needs. ?We discussed compliance with medications and appointments and her and spouse are both committed to being compliant ? ? ? ?Final next level of care: Home/Self Care ?Barriers to Discharge: No Barriers Identified ? ? ?Patient Goals and CMS Choice ?  ?  ?  ? ?Discharge Placement ?  ?           ?  ?  ?  ?  ? ?Discharge Plan and Services ?  ?  ?           ?  ?  ?  ?  ?  ?  ?  ?  ?  ?  ? ?Social Determinants of Health (SDOH) Interventions ?  ? ? ?Readmission Risk Interventions ?   ? View : No data to display.  ?  ?  ?  ? ? ? ? ? ?

## 2021-07-14 NOTE — Progress Notes (Signed)
?Progress Note ? ? ?Patient: Karen Miller DOB: 01-13-1961 DOA: 07/12/2021     1 ?DOS: the patient was seen and examined on 07/14/2021 ?  ?Brief hospital course: ?[name] was admitted to the hospital with the working diagnosis of hypertensive emergency in the setting of urine infection.  ? ?61 yo female with the past medical history of hypertension, and obesity who presented to urgent care not feeling well, she was diagnosed with UTI and possible atrial fibrillation, transferred to [facility] for further management. No chest pain, no palpitations, no dyspnea, PND or orthopnea. On her initial physical examination blood pressure 194/120, HR 105, RR 25 and 02 saturation 98%, lungs with no wheezing or rales, heart with S1 and S2 present regular, abdomen not distended and no lower extremity edema,  ? ?Na 135, K 3,4 Cl 99, bicarbonate 25, glucose 239 bun 10 cr 0,71  ?High sensitive troponin 295, 268, 282  ?Wbc 11,4 hgb 16,6 plt 334  ?UA >50 wbc, >50 RBC, SG 1,020, 100 protein  ? ?Chest radiograph with mild cardiomegaly and mild hilar vascular congestion with no infiltrates ? ?EKG 125 bpm, normal axis, normal intervals, sinus rhythm, with multiple PVC, no significant ST segment or T wave changes.  ? ?Patient was placed on telemetry monitoring and remained on sinus rhythm. ?Atrial fibrillation was ruled out.  ?Troponin flat with, acute coronary syndrome was ruled out.  ? ?Patient was placed on oral antihypertensive agents with improvement in blood pressure. ?Will continue antibiotic therapy for 3 more days.  ? ?Assessment and Plan: ?* Hypertensive emergency ?Patient was admitted to the cardiac ward and was placed on oral antihypertensive agents with good toleration.  ?No frank end organ damage, hypertensive urgency and not emergency.  ? ?Further work up with echocardiogram with preserved LV systolic function with EF 65 to 70%, with severe left ventricular hypertrophy. Preserved RV systolic function. Moderate  dilatation of left atrial size. Severe mitral annular calcification, mild to moderate stenosis.  ? ?Elevated troponin possible demand ischemia, will plan for cardiac CT tomorrow, add B blocker for rate control.  ? ?Continue blood pressure control with losartan and HCTZ.  ? ?No current evidence of atrial fibrillation.  ? ?Acute CHF (congestive heart failure) ([place]) ?Patient with no signs of volume overload today. ?She had on dose of furosemide on admission ? ?Echocardiogram with preserved LV systolic function and positive LVH. No wall motion abnormalities.  ?Continue blood pressure control with ARB and HCTZ, ?Follow up as outpatient.  ? ?UTI (urinary tract infection) ?Patient with concentrated urine ?Continue antibiotic therapy with bactrim for 3 more days.  ? ?Type 2 diabetes mellitus with hyperlipidemia ([place]) ?New diagnosis of T2DM ?Hgb A1c 8.9  ? ?Fasting glucose is 214. ?Patient was placed on insulin sliding scale during her hospitalization and started on metformin. ?Plan to continue metformin as outpatient and continue capillary glucose monitoring at home.  ?Life style modifications recommended.   ? ?Continue with statin therapy for dyslipidemia ?LDL 167 with total cholesterol 231.  ? ?Obesity, Class III, BMI 40-49.9 (morbid obesity) ([place]) ?Calculated BMI is 42.  ? ? ? ? ?  ? ? ? ?Physical Exam: ?Vitals:  ? 07/14/21 0015 07/14/21 0557 07/14/21 0802 07/14/21 1130  ?BP: (!) 169/69 (!) 168/78 (!) 157/78 (!) 178/98  ?Pulse: 88 97 (!) 101 95  ?Resp: 20 (!) 22 (!) 24 (!) 22  ?Temp: 98.1 ?F (36.7 ?C) 98.1 ?F (36.7 ?C)  98.2 ?F (36.8 ?C)  ?TempSrc: Oral Oral  Oral  ?  SpO2: 98% 99% 94% 95%  ?Weight:  116.7 kg    ?Height:      ? ?  ?Patient is feeling better, no chest pain or dyspnea ?  ?Neurology awake and alert  ?ENT with no pallor ?Cardiovascular with S1 and S2 present with no gallops, rubs or murmurs ?Lungs with no wheezing, rales or rhonchi ?Abdomen not distended ?No lower extremity edema (non pitting due to body  habitus)  ?  ?Data Reviewed: ? ? ? ?Family Communication: I spoke with patient's husband at the bedside, we talked in detail about patient's condition, plan of care and prognosis and all questions were addressed. ? ? ? ?Disposition: ?Status is: Inpatient ?Remains inpatient appropriate because: Cardiac CT  ? Planned Discharge Destination: Home ? ? ? ?Author: ?Tawni Millers, MD ?07/14/2021 11:33 AM ? ?For on call review [email or web address].  ?

## 2021-07-15 ENCOUNTER — Inpatient Hospital Stay (HOSPITAL_COMMUNITY): Payer: BC Managed Care – PPO

## 2021-07-15 ENCOUNTER — Other Ambulatory Visit (HOSPITAL_COMMUNITY): Payer: Self-pay

## 2021-07-15 DIAGNOSIS — I700 Atherosclerosis of aorta: Secondary | ICD-10-CM

## 2021-07-15 DIAGNOSIS — I160 Hypertensive urgency: Secondary | ICD-10-CM

## 2021-07-15 DIAGNOSIS — E876 Hypokalemia: Secondary | ICD-10-CM

## 2021-07-15 LAB — URINE CULTURE: Culture: >=100000 — AB

## 2021-07-15 LAB — CBC
HCT: 44.6 % (ref 36.0–46.0)
Hemoglobin: 14.7 g/dL (ref 12.0–15.0)
MCH: 29.8 pg (ref 26.0–34.0)
MCHC: 33 g/dL (ref 30.0–36.0)
MCV: 90.3 fL (ref 80.0–100.0)
Platelets: 293 10*3/uL (ref 150–400)
RBC: 4.94 MIL/uL (ref 3.87–5.11)
RDW: 13 % (ref 11.5–15.5)
WBC: 9.8 10*3/uL (ref 4.0–10.5)
nRBC: 0 % (ref 0.0–0.2)

## 2021-07-15 LAB — GLUCOSE, CAPILLARY
Glucose-Capillary: 221 mg/dL — ABNORMAL HIGH (ref 70–99)
Glucose-Capillary: 194 mg/dL — ABNORMAL HIGH (ref 70–99)
Glucose-Capillary: 176 mg/dL — ABNORMAL HIGH (ref 70–99)

## 2021-07-15 MED ORDER — LOSARTAN POTASSIUM 50 MG PO TABS
50.0000 mg | ORAL_TABLET | Freq: Every day | ORAL | 0 refills | Status: DC
  Filled 2021-07-15: qty 30, 30d supply, fill #0

## 2021-07-15 MED ORDER — ATORVASTATIN CALCIUM 40 MG PO TABS
40.0000 mg | ORAL_TABLET | Freq: Every day | ORAL | 0 refills | Status: DC
  Filled 2021-07-15: qty 30, 30d supply, fill #0

## 2021-07-15 MED ORDER — NITROGLYCERIN 0.4 MG SL SUBL
SUBLINGUAL_TABLET | SUBLINGUAL | Status: AC
  Filled 2021-07-15: qty 1

## 2021-07-15 MED ORDER — HYDROCHLOROTHIAZIDE 25 MG PO TABS
25.0000 mg | ORAL_TABLET | Freq: Every day | ORAL | 0 refills | Status: DC
  Filled 2021-07-15: qty 30, 30d supply, fill #0

## 2021-07-15 MED ORDER — SULFAMETHOXAZOLE-TRIMETHOPRIM 800-160 MG PO TABS
1.0000 | ORAL_TABLET | Freq: Two times a day (BID) | ORAL | 0 refills | Status: AC
  Filled 2021-07-15: qty 6, 3d supply, fill #0

## 2021-07-15 MED ORDER — ASPIRIN 81 MG PO TBEC
81.0000 mg | DELAYED_RELEASE_TABLET | Freq: Every day | ORAL | 0 refills | Status: DC
  Filled 2021-07-15: qty 30, 30d supply, fill #0

## 2021-07-15 MED ORDER — METOPROLOL TARTRATE 50 MG PO TABS
50.0000 mg | ORAL_TABLET | Freq: Two times a day (BID) | ORAL | Status: DC
Start: 2021-07-15 — End: 2021-07-15

## 2021-07-15 MED ORDER — ASPIRIN EC 81 MG PO TBEC
81.0000 mg | DELAYED_RELEASE_TABLET | Freq: Every day | ORAL | Status: DC
  Administered 2021-07-15: 81 mg via ORAL
  Filled 2021-07-15: qty 1

## 2021-07-15 MED ORDER — LOSARTAN POTASSIUM 50 MG PO TABS
50.0000 mg | ORAL_TABLET | Freq: Every day | ORAL | 1 refills | Status: DC
  Filled 2021-07-15: qty 30, 30d supply, fill #0

## 2021-07-15 MED ORDER — ASPIRIN 81 MG PO TBEC
81.0000 mg | DELAYED_RELEASE_TABLET | Freq: Every day | ORAL | 1 refills | Status: DC
  Filled 2021-07-15: qty 30, 30d supply, fill #0

## 2021-07-15 MED ORDER — ATORVASTATIN CALCIUM 40 MG PO TABS
40.0000 mg | ORAL_TABLET | Freq: Every day | ORAL | 1 refills | Status: DC
  Filled 2021-07-15: qty 30, 30d supply, fill #0

## 2021-07-15 MED ORDER — METOPROLOL TARTRATE 50 MG PO TABS
50.0000 mg | ORAL_TABLET | Freq: Two times a day (BID) | ORAL | 1 refills | Status: DC
  Filled 2021-07-15: qty 60, 30d supply, fill #0

## 2021-07-15 MED ORDER — IVABRADINE HCL 5 MG PO TABS
10.0000 mg | ORAL_TABLET | Freq: Once | ORAL | Status: AC
  Administered 2021-07-15: 10 mg via ORAL
  Filled 2021-07-15: qty 2

## 2021-07-15 MED ORDER — METFORMIN HCL 500 MG PO TABS
500.0000 mg | ORAL_TABLET | Freq: Every day | ORAL | 0 refills | Status: DC
  Filled 2021-07-15: qty 30, 30d supply, fill #0

## 2021-07-15 MED ORDER — HYDROCHLOROTHIAZIDE 25 MG PO TABS
25.0000 mg | ORAL_TABLET | Freq: Every day | ORAL | 1 refills | Status: DC
  Filled 2021-07-15: qty 30, 30d supply, fill #0

## 2021-07-15 MED ORDER — NITROGLYCERIN 0.4 MG SL SUBL
SUBLINGUAL_TABLET | SUBLINGUAL | Status: AC
  Filled 2021-07-15: qty 2

## 2021-07-15 MED ORDER — METFORMIN HCL 500 MG PO TABS
500.0000 mg | ORAL_TABLET | Freq: Every day | ORAL | 1 refills | Status: DC
  Filled 2021-07-15: qty 30, 30d supply, fill #0

## 2021-07-15 MED ORDER — IOHEXOL 350 MG/ML SOLN
100.0000 mL | Freq: Once | INTRAVENOUS | Status: AC | PRN
  Administered 2021-07-15: 100 mL via INTRAVENOUS

## 2021-07-15 MED ORDER — METOPROLOL TARTRATE 5 MG/5ML IV SOLN
INTRAVENOUS | Status: AC
  Filled 2021-07-15: qty 10

## 2021-07-15 MED ORDER — METOPROLOL TARTRATE 50 MG PO TABS
50.0000 mg | ORAL_TABLET | Freq: Two times a day (BID) | ORAL | 0 refills | Status: DC
  Filled 2021-07-15: qty 60, 30d supply, fill #0

## 2021-07-15 MED ORDER — METOPROLOL TARTRATE 100 MG PO TABS
100.0000 mg | ORAL_TABLET | Freq: Once | ORAL | Status: AC
  Administered 2021-07-15: 100 mg via ORAL
  Filled 2021-07-15: qty 1

## 2021-07-15 NOTE — Discharge Instructions (Signed)

## 2021-07-15 NOTE — Procedures (Signed)
? ?Progress Note ? ?Patient Name: Karen Miller ?Date of Encounter: 07/15/2021 ? ?Primary Cardiologist: Elouise Munroe, MD  ? ?Subjective  ? ?No events overnight.  ?No CP, SOB, No palpitations. ?She has had little follow up prior to family.  ?Cardiac CT is pending ? ?Inpatient Medications  ?  ?Scheduled Meds: ? atorvastatin  40 mg Oral Daily  ? hydrochlorothiazide  25 mg Oral Daily  ? insulin aspart  0-9 Units Subcutaneous TID WC  ? losartan  50 mg Oral Daily  ? metFORMIN  500 mg Oral Q breakfast  ? metoprolol tartrate      ? metoprolol tartrate  25 mg Oral BID  ? nitroGLYCERIN      ? nitroGLYCERIN      ? sulfamethoxazole-trimethoprim  1 tablet Oral Q12H  ? ?Continuous Infusions: ? ?PRN Meds: ?hydrALAZINE, nitroGLYCERIN  ? ?Vital Signs  ?  ?Vitals:  ? 07/15/21 0423 07/15/21 0724 07/15/21 1011 07/15/21 1104  ?BP: (!) 158/76 (!) 154/71 (!) 150/83 (!) 149/74  ?Pulse: 86 92 91 75  ?Resp: 19 (!) 22 (!) 21 17  ?Temp: 97.8 ?F (36.6 ?C) 98 ?F (36.7 ?C) 98.7 ?F (37.1 ?C) 98.6 ?F (37 ?C)  ?TempSrc: Oral Oral Oral Oral  ?SpO2: 95% 97% 97% 97%  ?Weight:      ?Height:      ? ? ?Intake/Output Summary (Last 24 hours) at 07/15/2021 1251 ?Last data filed at 07/15/2021 1104 ?Gross per 24 hour  ?Intake 480 ml  ?Output 2850 ml  ?Net -2370 ml  ? ?Filed Weights  ? 07/13/21 0405 07/14/21 0557 07/15/21 0209  ?Weight: 115.7 kg 116.7 kg 116.8 kg  ? ? ?Telemetry  ?  ?SR - Personally Reviewed ? ?Physical Exam  ? ?Gen: no distress, Morbid obesity   ?Neck: No JVD,  carotid bruit ?Cardiac: No Rubs or Gallops, no Murmur, RRR +2radial pulses ?Respiratory: Clear to auscultation bilaterally, normal  effort, normal  respiratory rate ?GI: Soft, nontender, non-distended  ?MS: No  edema;  moves all extremities ?Integument: Skin feels warm ?Neuro:  At time of evaluation, alert and oriented to person/place/time/situation  ?Psych: Normal affect, patient feels well ? ? ?Labs  ?  ?Chemistry ?Recent Labs  ?Lab 07/12/21 ?1204 07/13/21 ?0105 07/14/21 ?5053  ?NA  135 136 135  ?K 3.4* 3.1* 3.0*  ?CL 99 98 99  ?CO2 _0 ?GLUCOSE 239* 295* 214*  ?BUN 10 14 21*  ?CREATININE 0.71 0.77 0.88  ?CALCIUM 9.5 9.3 9.2  ?PROT 8.6*  --   --   ?ALBUMIN 4.1  --   --   ?AST 30  --   --   ?ALT 29  --   --   ?ALKPHOS 99  --   --   ?BILITOT 0.8  --   --   ?GFRNONAA >60 >60 >60  ?ANIONGAP _1 ?  ? ?Hematology ?Recent Labs  ?Lab 07/13/21 ?0105 07/14/21 ?9767 07/15/21 ?0407  ?WBC 10.8* 9.9 9.8  ?RBC 4.97 4.74 4.94  ?HGB 15.3* 14.3 14.7  ?HCT 43.5 42.4 44.6  ?MCV 87.5 89.5 90.3  ?MCH 30.8 30.2 29.8  ?MCHC 35.2 33.7 33.0  ?RDW 12.9 13.1 13.0  ?PLT 303 294 293  ? ? ?Cardiac EnzymesNo results for input(s): TROPONINI in the last 168 hours. No results for input(s): TROPIPOC in the last 168 hours.  ? ?BNP ?Recent Labs  ?Lab 07/12/21 ?2020  ?BNP 169.5*  ?  ? ?DDimer No results for input(s): DDIMER in the last  168 hours.  ? ?Radiology  ?  ?No results found. ? ?Cardiac Studies  ? ?Severe Mitral annular calcification without evidence of vegetation. HU 1046. 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 ?Patient Profile  ?   ?61 y.o. female with elevated troponin in the setting of hypertensive emergence ? ?Assessment & Plan  ?  ?Hypertensive emergency ?New DM ?Elevated troponin ?Morbid obesity ?Concern for at mild non obstructive CAD; CCTA is pending ?-will continue atorvastatin 40 mg PO daily ?- will increase metoprolol to 50 mg PO BID given elevated heart rates and BP ?- will add ASA 81 mg PO daily ?- agree with primary attempts to decrease BP as well ? ?No objective evidence AF, will not start AC at this time ? ?Severe mitral annular calcification ?- no prior CKD or radiation exposure that explains her level of MAC  ?- she has LDL of 156, will aggressively decrease ?-if BC negative optimistic this may not be endocarditis; if BC negative patient may be able to be discharged; we will work to arrange outpatient follow up ? ? ?For questions or updates, please contact [facility] ?Please  consult [email or web address] for contact info under Cardiology/STEMI. ?  ?   ?Signed, ?Werner Lean, MD  ?07/15/2021, 12:51 PM   ? ?

## 2021-07-15 NOTE — TOC Transition Note (Signed)
Transition of Care (TOC) - CM/SW Discharge Note ? ? ?Patient Details  ?Name: Karen Miller ?MRN: [id number] ?Date of Birth: 10/18/1960 ? ?Transition of Care (TOC) CM/SW Contact:  ?Zenon Mayo, RN ?Phone Number: ?07/15/2021, 3:58 PM ? ? ?Clinical Narrative:    ?Patient is for dc today, has no needs.  Her spouse is at the bedside to transport her home.   ? ? ?Final next level of care: Home/Self Care ?Barriers to Discharge: No Barriers Identified ? ? ?Patient Goals and CMS Choice ?  ?  ?  ? ?Discharge Placement ?  ?           ?  ?  ?  ?  ? ?Discharge Plan and Services ?  ?  ?           ?  ?  ?  ?  ?  ?  ?  ?  ?  ?  ? ?Social Determinants of Health (SDOH) Interventions ?  ? ? ?Readmission Risk Interventions ?   ? View : No data to display.  ?  ?  ?  ? ? ? ? ? ?

## 2021-07-15 NOTE — Assessment & Plan Note (Signed)
Hypokalemia associated with diuretic use. ?Patient had KCL repletion and plan to follow up renal function and electrolytes as outpatient.  ?

## 2021-07-15 NOTE — Plan of Care (Signed)
?  RD consulted for nutrition education regarding diabetes.  ?Pt reports that she drinks mostly water. Her and her husband wake at 1:30 am and go walk from 3 am to 4:30 ish then go out to eat for Breakfast. She reports that she does keep chips/honey buns/reese cups in her house that she plans to get rid of. Pt is motivated for change.  ? ?Lab Results  ?Component Value Date  ? HGBA1C 8.9 (H) 07/12/2021  ? ? ?RD provided "Plate Method" handout from the [employer]. Discussed different food groups and their effects on blood sugar, emphasizing carbohydrate-containing foods. Provided list of carbohydrates and recommended serving sizes of common foods.We also discussed low sodium diet.  ? ?Discussed importance of controlled and consistent carbohydrate intake throughout the day. Provided examples of ways to balance meals/snacks and encouraged intake of high-fiber, whole grain complex carbohydrates. Teach back method used. ? ?Expect good compliance. ? ?Body mass index is 42.85 kg/m?Marland Kitchen Pt meets criteria for morbid obesity based on current BMI. ? ?Current diet order is Carb Mod/Low sodium, patient is consuming approximately 100% of meals at this time. Labs and medications reviewed. No further nutrition interventions warranted at this time. RD contact information provided. If additional nutrition issues arise, please re-consult RD. ? ?Lockie Pares., RD, LDN, CNSC ?See AMiON for contact information  ? ? ?

## 2021-07-15 NOTE — Progress Notes (Addendum)
?Progress Note ? ? ?Patient: Karen Miller ZCH:[id number] DOB: April 24, 1960 DOA: 07/12/2021     2 ?DOS: the patient was seen and examined on 07/15/2021 ?  ?Brief hospital course: ?[name] was admitted to the hospital with the working diagnosis of hypertensive emergency in the setting of urine infection.  ? ?61 yo female with the past medical history of hypertension, and obesity who presented to urgent care not feeling well, she was diagnosed with UTI and possible atrial fibrillation, transferred to [facility] for further management. No chest pain, no palpitations, no dyspnea, PND or orthopnea. On her initial physical examination blood pressure 194/120, HR 105, RR 25 and 02 saturation 98%, lungs with no wheezing or rales, heart with S1 and S2 present regular, abdomen not distended and no lower extremity edema,  ? ?Na 135, K 3,4 Cl 99, bicarbonate 25, glucose 239 bun 10 cr 0,71  ?High sensitive troponin 295, 268, 282  ?Wbc 11,4 hgb 16,6 plt 334  ?UA >50 wbc, >50 RBC, SG 1,020, 100 protein  ? ?Chest radiograph with mild cardiomegaly and mild hilar vascular congestion with no infiltrates ? ?EKG 125 bpm, normal axis, normal intervals, sinus rhythm, with multiple PVC, no significant ST segment or T wave changes.  ? ?Patient was placed on telemetry monitoring and remained on sinus rhythm. ?Atrial fibrillation was ruled out.  ?Troponin flat with, acute coronary syndrome was ruled out.  ? ?Patient was placed on oral antihypertensive agents with improvement in blood pressure. ?Will continue antibiotic therapy for 3 more days.  ? ?Assessment and Plan: ?* Hypertensive urgency ?Patient was admitted to the cardiac ward and was placed on oral antihypertensive agents with good toleration.  ?No frank end organ damage, hypertensive urgency and not emergency.  ? ?Further work up with echocardiogram with preserved LV systolic function with EF 65 to 70%, with severe left ventricular hypertrophy. Preserved RV systolic function. Moderate  dilatation of left atrial size. Severe mitral annular calcification, mild to moderate mitral stenosis.  ? ?Elevated troponin possible demand ischemia, will plan for cardiac CT and continue with B blocker for rate control.  ? ?Continue blood pressure control with losartan and HCTZ.  ? ?No current evidence of atrial fibrillation.  ? ?Acute CHF (congestive heart failure) ([place]) ?Patient with no signs of volume overload today. ?She had on dose of furosemide on admission ? ?Echocardiogram with preserved LV systolic function and positive LVH. No wall motion abnormalities.  ?Continue blood pressure control with ARB and HCTZ, ?Follow up as outpatient.  ? ?UTI (urinary tract infection) ?Patient with concentrated urine ?Continue antibiotic therapy with bactrim for 3 more days.  ?Urine culture positive for E Coli >100,000 CFU ?Sensitive to bactrim.  ? ?Type 2 diabetes mellitus with hyperlipidemia ([place]) ?New diagnosis of T2DM ?Hgb A1c 8.9  ? ?Fasting glucose is 214. ?Patient was placed on insulin sliding scale during her hospitalization and started on metformin. ?Plan to continue metformin as outpatient and continue capillary glucose monitoring at home.  ?Life style modifications recommended.   ? ?Continue with statin therapy for dyslipidemia ?LDL 167 with total cholesterol 231.  ? ?Obesity, Class III, BMI 40-49.9 (morbid obesity) ([place]) ?Calculated BMI is 42.  ? ? ? ? ?  ? ?Subjective: Patient with no chest pain or dyspnea, no nausea or vomiting.  ? ?Physical Exam: ?Vitals:  ? 07/15/21 0423 07/15/21 0724 07/15/21 1011 07/15/21 1104  ?BP: (!) 158/76 (!) 154/71 (!) 150/83 (!) 149/74  ?Pulse: 86 92 91 75  ?Resp: 19 (!) 22 (!)  21 17  ?Temp: 97.8 ?F (36.6 ?C) 98 ?F (36.7 ?C) 98.7 ?F (37.1 ?C) 98.6 ?F (37 ?C)  ?TempSrc: Oral Oral Oral Oral  ?SpO2: 95% 97% 97% 97%  ?Weight:      ?Height:      ? ?Neurology awake and alert ?ENT with no pallor ?Cardiovascular with S1 and S2 present and rhythmic with no gallops, rubs or  murmurs ?Respiratory with no wheezing or rales ?Abdomen protuberant but not distended ?Non pitting lower extremity edema  ?Data Reviewed: ? ? ? ?Family Communication: I spoke with patient's husband at the bedside, we talked in detail about patient's condition, plan of care and prognosis and all questions were addressed. ? ? ?Disposition: ?Status is: Inpatient ?Remains inpatient appropriate because: possible dc home today after cardiac CT  ? Planned Discharge Destination: Home ? ? ? ?Author: ?Tawni Millers, MD ?07/15/2021 12:59 PM ? ?For on call review [email or web address]  ?

## 2021-07-15 NOTE — Discharge Summary (Addendum)
?Physician Discharge Summary ?  ?Patient: Karen Miller MRN: [id number] DOB: 1960/07/31  ?Admit date:     07/12/2021  ?Discharge date: 07/15/21  ?Discharge Physician: Karen Miller  ? ?PCP: Pcp, No  ? ?Recommendations at discharge:  ? ? Patient has been placed on aspirin and statin  ?Blood pressure control with losartan and HCTZ ?Continue antibiotic therapy with Bactrim for 3 more days.  ? ?Discharge Diagnoses: ?Principal Problem: ?  Hypertensive urgency ?Active Problems: ?  Acute CHF (congestive heart failure) ([place]) ?  UTI (urinary tract infection) ?  Type 2 diabetes mellitus with hyperlipidemia ([place]) ?  Obesity, Class III, BMI 40-49.9 (morbid obesity) ([place]) ? ?Resolved Problems: ?  * No resolved hospital problems. * ? ?Hospital Course: ?[name] was admitted to the hospital with the working diagnosis of hypertensive emergency in the setting of urine infection.  ? ?61 yo female with the past medical history of hypertension, and obesity who presented to urgent care not feeling well, she was diagnosed with UTI and possible atrial fibrillation, transferred to [facility] for further management. No chest pain, no palpitations, no dyspnea, PND or orthopnea. On her initial physical examination blood pressure 194/120, HR 105, RR 25 and 02 saturation 98%, lungs with no wheezing or rales, heart with S1 and S2 present regular, abdomen not distended and no lower extremity edema,  ? ?Na 135, K 3,4 Cl 99, bicarbonate 25, glucose 239 bun 10 cr 0,71  ?High sensitive troponin 295, 268, 282  ?Wbc 11,4 hgb 16,6 plt 334  ?UA >50 wbc, >50 RBC, SG 1,020, 100 protein  ? ?Chest radiograph with mild cardiomegaly and mild hilar vascular congestion with no infiltrates ? ?EKG 125 bpm, normal axis, normal intervals, sinus rhythm, with multiple PVC, no significant ST segment or T wave changes.  ? ?Patient was placed on telemetry monitoring and remained on sinus rhythm. ?Atrial fibrillation was ruled out.  ?Troponin flat with, acute coronary  syndrome was ruled out.  ? ?Patient was placed on oral antihypertensive agents with improvement in blood pressure. ?Will continue antibiotic therapy for 3 more days.  ? ?Assessment and Plan: ?* Hypertensive urgency ?Patient was admitted to the cardiac ward and was placed on oral antihypertensive agents with good toleration.  ?No frank end organ damage, hypertensive urgency and not emergency.  ? ?Further work up with echocardiogram with preserved LV systolic function with EF 65 to 70%, with severe left ventricular hypertrophy. Preserved RV systolic function. Moderate dilatation of left atrial size. Severe mitral annular calcification, mild to moderate mitral stenosis.  ? ?Elevated troponin possible demand ischemia ?Cardiac CT with coronary calcium 204, 94th percentile for age-race and sex matched controls. Minimal changes in the left main, LAD and left circumflex arteries. Mitral annular calcification.  ? ?Blood pressure control with, metoprolol, losartan and HCTZ.  ? ?No current evidence of atrial fibrillation.  ? ?Acute CHF (congestive heart failure) ([place]) ?Patient with no signs of volume overload today. ?She had on dose of furosemide on admission ?Acute diastolic heart failure.  ? ?Echocardiogram with preserved LV systolic function and positive LVH. No wall motion abnormalities.  ?Continue blood pressure control with ARB and HCTZ. ?Added  Metoprolol with good toleration.  ?Follow up as outpatient.  ? ?UTI (urinary tract infection) ?Patient with concentrated urine ?Continue antibiotic therapy with bactrim for 3 more days.  ?Urine culture positive for E Coli >100,000 CFU ?Sensitive to bactrim.  ? ?Type 2 diabetes mellitus with hyperlipidemia ([place]) ?New diagnosis of T2DM ?Hgb A1c 8.9  ? ?  Fasting glucose is 214. ?Patient was placed on insulin sliding scale during her hospitalization and started on metformin. ?Plan to continue metformin as outpatient and continue capillary glucose monitoring at home.  ?Life style  modifications recommended.   ? ?Continue with statin therapy for dyslipidemia ?LDL 167 with total cholesterol 231.  ? ?Hypokalemia ?Hypokalemia associated with diuretic use. ?Patient had KCL repletion and plan to follow up renal function and electrolytes as outpatient.  ? ?Obesity, Class III, BMI 40-49.9 (morbid obesity) ([place]) ?Calculated BMI is 42.  ? ? ? ? ?  ? ? ?Consultants: cardiology  ?Procedures performed: none   ?Disposition: Home ?Diet recommendation:  ?Discharge Diet Orders (From admission, onward)  ? ?  Start     Ordered  ? 07/15/21 0000  Diet - low sodium heart healthy       ? 07/15/21 1535  ? ?  ?  ? ?  ? ?Cardiac and Carb modified diet ?DISCHARGE MEDICATION: ?Allergies as of 07/15/2021   ?No Known Allergies ?  ? ?  ?Medication List  ?  ? ?TAKE these medications   ? ?aspirin 81 MG EC tablet ?Take 1 tablet (81 mg total) by mouth daily. Swallow whole. ?Start taking on: Jul 16, 2021 ?  ?atorvastatin 40 MG tablet ?Commonly known as: LIPITOR ?Take 1 tablet (40 mg total) by mouth daily. ?Start taking on: Jul 16, 2021 ?  ?hydrochlorothiazide 25 MG tablet ?Commonly known as: HYDRODIURIL ?Take 1 tablet (25 mg total) by mouth daily. ?  ?losartan 50 MG tablet ?Commonly known as: COZAAR ?Take 1 tablet (50 mg total) by mouth daily. ?  ?metFORMIN 500 MG tablet ?Commonly known as: GLUCOPHAGE ?Take 1 tablet (500 mg total) by mouth daily with breakfast. ?  ?metoprolol tartrate 50 MG tablet ?Commonly known as: LOPRESSOR ?Take 1 tablet (50 mg total) by mouth 2 (two) times daily. ?  ?sulfamethoxazole-trimethoprim 800-160 MG tablet ?Commonly known as: BACTRIM DS ?Take 1 tablet by mouth every 12 (twelve) hours for 3 days. ?  ? ?  ? ? Follow-up Information   ? ? Primary Care Follow up.   ?Why: call this number to establish a [facility] MD for primary care ?Contact information: ?[phone] ? ?  ?  ? ?  ?  ? ?  ? ?Discharge Exam: ?Filed Weights  ? 07/13/21 0405 07/14/21 0557 07/15/21 0209  ?Weight: 115.7 kg 116.7 kg 116.8 kg  ? ?BP (!)  149/74 (BP Location: Left Arm)   Pulse 75   Temp 98.6 ?F (37 ?C) (Oral)   Resp 17   Ht '5\' 5"'$  (1.651 m)   Wt 116.8 kg   SpO2 97%   BMI 42.85 kg/m?  ? ?Patient is feeling better, no chest pain or dyspnea ? ?Neurology awake and alert ?ENT with no pallor or icterus ?Cardiovascular with S1 and S2 present and rhythmic with no gallops, rubs or murmurs ?Respiratory with no rales ?Abdomen not distended ?Non pitting lower extremity edema (body habitus).  ? ?Condition at discharge: stable ? ?The results of significant diagnostics from this hospitalization (including imaging, microbiology, ancillary and laboratory) are listed below for reference.  ? ?Imaging Studies: ?DG Chest 2 View ? ?Result Date: 07/12/2021 ?CLINICAL DATA:  Chest pain EXAM: CHEST - 2 VIEW COMPARISON:  Radiograph 03/29/2012 FINDINGS: Unchanged cardiomediastinal silhouette. There is no focal airspace consolidation. There are mild interstitial opacities. There is no pleural effusion. No pneumothorax. Chronic right upper rib injuries. Thoracic spondylosis. IMPRESSION: Mild interstitial opacities could reflect interstitial edema. Electronically Signed  By: Maurine Simmering M.D.   On: 07/12/2021 11:56  ? ?CT Angio Chest PE W and/or Wo Contrast ? ?Result Date: 07/12/2021 ?CLINICAL DATA:  Pulmonary embolism (PE) suspected, high prob EXAM: CT ANGIOGRAPHY CHEST WITH CONTRAST TECHNIQUE: Multidetector CT imaging of the chest was performed using the standard protocol during bolus administration of intravenous contrast. Multiplanar CT image reconstructions and MIPs were obtained to evaluate the vascular anatomy. RADIATION DOSE REDUCTION: This exam was performed according to the departmental dose-optimization program which includes automated exposure control, adjustment of the mA and/or kV according to patient size and/or use of iterative reconstruction technique. CONTRAST:  6m OMNIPAQUE IOHEXOL 350 MG/ML SOLN COMPARISON:  Correlation is made with chest x-ray on the same  day earlier FINDINGS: Cardiovascular: Mild cardiomegaly. Left ventricular hypertrophy. There are extensive coarse annular calcification seen at the mitral valve. Main pulmonary artery and its branches up to the

## 2021-07-18 LAB — CULTURE, BLOOD (ROUTINE X 2)
Culture: NO GROWTH
Culture: NO GROWTH

## 2021-07-25 ENCOUNTER — Other Ambulatory Visit (HOSPITAL_BASED_OUTPATIENT_CLINIC_OR_DEPARTMENT_OTHER): Payer: Self-pay

## 2021-08-02 ENCOUNTER — Other Ambulatory Visit (HOSPITAL_COMMUNITY): Payer: Self-pay

## 2021-08-20 ENCOUNTER — Encounter: Payer: Self-pay | Admitting: Nurse Practitioner

## 2021-08-20 ENCOUNTER — Ambulatory Visit (INDEPENDENT_AMBULATORY_CARE_PROVIDER_SITE_OTHER): Payer: BC Managed Care – PPO | Admitting: Nurse Practitioner

## 2021-08-20 VITALS — BP 178/88 | HR 104 | Ht 64.0 in | Wt 258.2 lb

## 2021-08-20 DIAGNOSIS — I10 Essential (primary) hypertension: Secondary | ICD-10-CM

## 2021-08-20 DIAGNOSIS — I480 Paroxysmal atrial fibrillation: Secondary | ICD-10-CM

## 2021-08-20 DIAGNOSIS — I5033 Acute on chronic diastolic (congestive) heart failure: Secondary | ICD-10-CM

## 2021-08-20 DIAGNOSIS — I2510 Atherosclerotic heart disease of native coronary artery without angina pectoris: Secondary | ICD-10-CM

## 2021-08-20 DIAGNOSIS — I340 Nonrheumatic mitral (valve) insufficiency: Secondary | ICD-10-CM

## 2021-08-20 DIAGNOSIS — I342 Nonrheumatic mitral (valve) stenosis: Secondary | ICD-10-CM

## 2021-08-20 DIAGNOSIS — I3481 Nonrheumatic mitral (valve) annulus calcification: Secondary | ICD-10-CM

## 2021-08-20 DIAGNOSIS — E1169 Type 2 diabetes mellitus with other specified complication: Secondary | ICD-10-CM

## 2021-08-20 DIAGNOSIS — E785 Hyperlipidemia, unspecified: Secondary | ICD-10-CM

## 2021-08-20 MED ORDER — LOSARTAN POTASSIUM 100 MG PO TABS: 100.0000 mg | ORAL_TABLET | Freq: Every day | ORAL | 3 refills | Status: DC

## 2021-08-20 NOTE — Patient Instructions (Signed)
Medication Instructions:  Increase Losartan 100 mg daily.   *If you need a refill on your cardiac medications before your next appointment, please call your pharmacy*   Lab Work: Your physician recommends that you complete labs today.  BMET If you have labs (blood work) drawn today and your tests are completely normal, you will receive your results only by: [place] (if you have MyChart) OR A paper copy in the mail If you have any lab test that is abnormal or we need to change your treatment, we will call you to review the results.   Testing/Procedures: NONE ordered at this time of appointment     Follow-Up: At [facility], you and your health needs are our priority.  As part of our continuing mission to provide you with exceptional heart care, we have created designated Provider Care Teams.  These Care Teams include your primary Cardiologist (physician) and Advanced Practice Providers (APPs -  Physician Assistants and Nurse Practitioners) who all work together to provide you with the care you need, when you need it.  We recommend signing up for the patient portal called "MyChart".  Sign up information is provided on this After Visit Summary.  MyChart is used to connect with patients for Virtual Visits (Telemedicine).  Patients are able to view lab/test results, encounter notes, upcoming appointments, etc.  Non-urgent messages can be sent to your provider as well.   To learn more about what you can do with MyChart, go to [email or web address].    Your next appointment:   2 week(s)  The format for your next appointment:   In Person  Provider:   Diona Browner, NP        Other Instructions Monitor Blood pressure and heart rate. Please bring log back to your next appointment.   Important Information About Sugar

## 2021-08-20 NOTE — Progress Notes (Signed)
Office Visit    Patient Name: Karen Miller Date of Encounter: 08/20/2021  Primary Care Provider:  Pcp, No Primary Cardiologist:  Elouise Munroe, MD  Chief Complaint    61 year old female with a history of CAD, chronic diastolic heart failure, hypertension, hyperlipidemia, reported paroxysmal atrial fibrillation (no documented history), PVCs, type 2 diabetes, and obesity who presents for follow-up related to hypertension and heart failure.  Past Medical History    Past Medical History:  Diagnosis Date   Hypertension    No past surgical history on file.  Allergies  No Known Allergies  History of Present Illness    61 year old female with the above past medical history including CAD, chronic diastolic heart failure, hypertension, hyperlipidemia, reported paroxysmal atrial fibrillation (no documented history), PVCs, type 2 diabetes, and obesity.  She presented to the ED on 07/12/2021 with palpitations, tachycardia, elevated BP.  She presented to urgent care the day before and was told that she "might be in atrial fibrillation."  EKG from urgent care visit was unavailable for review, therefore, no documented history of atrial fibrillation.  EKG the ED showed sinus tachycardia, no evidence of ischemia, arrhythmia.  Troponin was elevated.  CT of the chest was negative for PE.  Cardiology was consulted.  She was hospitalized from 07/12/2021 to 07/15/2021 in the setting of hypertensive urgency, acute diastolic heart failure.  Echocardiogram showed EF 65 to 70%, hyperdynamic LV function, severe LVH, indeterminate diastolic parameters, degenerative mitral valve, mild mitral valve regurgitation, mild to moderate mitral stenosis, severe mitral annular calcification.  Troponin was elevated.  Coronary CT angiogram revealed coronary calcium score 204, 94th percentile for age, race, and sex-matched controls, minimal plaques in the left main, LAD, and left circumflex arteries, mild plaque in the RCA,  mitral annular calcification, and aortic atherosclerosis.  She was given furosemide and started on losartan and HCTZ.  Given presence of MAC, blood cultures were obtained however, these were negative. Therefore, TEE was not pursued given low suspicion for endocarditis with negative blood cultures.  She was also treated for a UTI.  A1c was 8.9 and she was diagnosed with new onset type 2 diabetes. She was discharged home in stable condition on 07/15/2021.  She presents today for follow-up. Since her hospitalization she has been stable from a cardiac standpoint.  She denies any palpitations, chest pain, dyspnea, edema, PND, orthopnea.  She is walking for exercise daily and is tolerating this well.  She is mildly tachycardic in the office today.  However, she reports feeling anxious as she has not been to the doctor in a long time. She reports home HR ranging from 70-90 bpm, BP in the 150s/70s-80s.  Overall, she reports feeling well and denies any new symptoms or concerns today.  Home Medications    Current Outpatient Medications  Medication Sig Dispense Refill   aspirin 81 MG EC tablet Take 1 tablet (81 mg total) by mouth daily. Swallow whole. 30 tablet 1   atorvastatin (LIPITOR) 40 MG tablet Take 1 tablet (40 mg total) by mouth daily. 30 tablet 1   hydrochlorothiazide (HYDRODIURIL) 25 MG tablet Take 1 tablet (25 mg total) by mouth daily. 30 tablet 1   losartan (COZAAR) 100 MG tablet Take 1 tablet (100 mg total) by mouth daily. 90 tablet 3   metFORMIN (GLUCOPHAGE) 500 MG tablet Take 1 tablet (500 mg total) by mouth daily with breakfast. 30 tablet 1   metoprolol tartrate (LOPRESSOR) 50 MG tablet Take 1 tablet (50 mg total)  by mouth 2 (two) times daily. 60 tablet 1   No current facility-administered medications for this visit.     Review of Systems    She denies chest pain, palpitations, dyspnea, pnd, orthopnea, n, v, dizziness, syncope, edema, weight gain, or early satiety. All other systems reviewed  and are otherwise negative except as noted above.   Physical Exam    VS:  BP (!) 178/88   Pulse (!) 104   Ht _0  (1.626 m)   Wt 258 lb 3.2 oz (117.1 kg)   SpO2 98%   BMI 44.32 kg/m  GEN: Well nourished, well developed, in no acute distress. HEENT: normal. Neck: Supple, no JVD, carotid bruits, or masses. Cardiac: RRR, no murmurs, rubs, or gallops. No clubbing, cyanosis, edema.  Radials/DP/PT 2+ and equal bilaterally.  Respiratory:  Respirations regular and unlabored, clear to auscultation bilaterally. GI: Soft, nontender, nondistended, BS + x 4. MS: no deformity or atrophy. Skin: warm and dry, no rash. Neuro:  Strength and sensation are intact. Psych: Normal affect.  Accessory Clinical Findings    ECG personally reviewed by me today - ST, 115 bpm, nonspecific ST/T wave changes - no acute changes.  Lab Results  Component Value Date   WBC 9.8 07/15/2021   HGB 14.7 07/15/2021   HCT 44.6 07/15/2021   MCV 90.3 07/15/2021   PLT 293 07/15/2021   Lab Results  Component Value Date   CREATININE 0.88 07/14/2021   BUN 21 (H) 07/14/2021   NA 135 07/14/2021   K 3.0 (L) 07/14/2021   CL 99 07/14/2021   CO2 24 07/14/2021   Lab Results  Component Value Date   ALT 29 07/12/2021   AST 30 07/12/2021   ALKPHOS 99 07/12/2021   BILITOT 0.8 07/12/2021   Lab Results  Component Value Date   CHOL 231 (H) 07/12/2021   HDL 40 (L) 07/12/2021   LDLCALC 167 (H) 07/12/2021   TRIG 118 07/12/2021   CHOLHDL 5.8 07/12/2021    Lab Results  Component Value Date   HGBA1C 8.9 (H) 07/12/2021    Assessment & Plan    1. Hypertension: BP elevated above goal. Will increase Losartan to 100 mg daily. I anitcipate she will require further escalation of antihypertensive regimen in the future. For now, continue hydrochlorothiazide.  We will check BMET today.  Continue to monitor BP and report BP consistently > 130/80. if BP remains elevated above goal, consider addition of amlodipine.  Plan for close  follow-up.  2. CAD: Coronary CT angiogram revealed coronary calcium score 204, 94th percentile for age, race, and sex-matched controls, minimal plaques in the left main, LAD, and left circumflex arteries, mild plaque in the RCA, mitral annular calcification, and aortic atherosclerosis. Stable with no anginal symptoms. No indication for ischemic evaluation. Continue aspirin, losartan, hydrochlorothiazide, and Lipitor.  3. Acute on chronic diastolic heart failure: Echo in 07/2021 showed EF 65 to 70%, hyperdynamic LV function, severe LVH, indeterminate diastolic parameters, degenerative mitral valve, mild mitral valve regurgitation, mild to moderate mitral stenosis, severe mitral annular calcification. Euvolemic and well compensated on exam.  No indication for loop diuretic at this time.  4. Valvular heart disease: Recent echo as above. 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 TEE was not pursued.  She appears to be asymptomatic.  Consider repeat echocardiogram in 1 year.   5. Reported paroxysmal atrial fibrillation/Sinus tachycardia: No documented evidence of atrial fibrillation.  She is not on anticoagulation.  She denies any palpitations.  EKG today shows ST, 115 bpm. She reports stable HR at home.  She states her HR is likely elevated today in the setting of anxiety. I do not think outpatient cardiac monitor is indicated at this time. I advised her to continue to monitor her HR at home.  If HR remains elevated, consider increasing metoprolol.    6. Hyperlipidemia: LDL was 167 in May 2023.  Plan for repeat lipids, LFTs and next follow-up visit.  Continue aspirin, Lipitor.  7. Type 2 diabetes/obesity: Newly diagnosed type 2 diabetes at most recent outpatient visit.  A1c was 8.9.   Monitored and managed per PCP.  Encouraged ongoing lifestyle modifications with diet and exercise.  8. Disposition: Follow-up in 2 weeks.   Lenna Sciara,  NP 08/20/2021, 4:34 PM

## 2021-08-21 LAB — BASIC METABOLIC PANEL
BUN/Creatinine Ratio: 26 (ref 12–28)
BUN: 23 mg/dL (ref 8–27)
CO2: 25 mmol/L (ref 20–29)
Calcium: 10.2 mg/dL (ref 8.7–10.3)
Chloride: 96 mmol/L (ref 96–106)
Creatinine, Ser: 0.9 mg/dL (ref 0.57–1.00)
Glucose: 130 mg/dL — ABNORMAL HIGH (ref 70–99)
Potassium: 4.2 mmol/L (ref 3.5–5.2)
Sodium: 138 mmol/L (ref 134–144)
eGFR: 73 mL/min/{1.73_m2} (ref >59)

## 2021-08-26 ENCOUNTER — Telehealth: Payer: Self-pay

## 2021-08-26 NOTE — Telephone Encounter (Addendum)
Called patient regarding results. Patient had understanding of results.----- Message from Lenna Sciara, NP sent at 08/25/2021  2:46 PM EDT ----- Recent labs show stable kidney function and electrolytes.  Continue current medications and follow-up as planned.  Thank you.

## 2021-08-26 NOTE — Progress Notes (Signed)
Called patient . SG

## 2021-08-28 ENCOUNTER — Encounter: Payer: Self-pay | Admitting: Nurse Practitioner

## 2021-08-28 ENCOUNTER — Ambulatory Visit (INDEPENDENT_AMBULATORY_CARE_PROVIDER_SITE_OTHER): Payer: BC Managed Care – PPO | Admitting: Nurse Practitioner

## 2021-08-28 VITALS — BP 182/94 | HR 105 | Temp 97.7°F | Resp 16 | Ht 64.0 in | Wt 254.1 lb

## 2021-08-28 DIAGNOSIS — I152 Hypertension secondary to endocrine disorders: Secondary | ICD-10-CM | POA: Insufficient documentation

## 2021-08-28 DIAGNOSIS — Z1211 Encounter for screening for malignant neoplasm of colon: Secondary | ICD-10-CM

## 2021-08-28 DIAGNOSIS — E1159 Type 2 diabetes mellitus with other circulatory complications: Secondary | ICD-10-CM | POA: Diagnosis not present

## 2021-08-28 DIAGNOSIS — E1169 Type 2 diabetes mellitus with other specified complication: Secondary | ICD-10-CM | POA: Diagnosis not present

## 2021-08-28 DIAGNOSIS — Z1231 Encounter for screening mammogram for malignant neoplasm of breast: Secondary | ICD-10-CM

## 2021-08-28 DIAGNOSIS — L659 Nonscarring hair loss, unspecified: Secondary | ICD-10-CM | POA: Insufficient documentation

## 2021-08-28 DIAGNOSIS — E785 Hyperlipidemia, unspecified: Secondary | ICD-10-CM | POA: Diagnosis not present

## 2021-08-28 LAB — TSH: TSH: 3.29 u[IU]/mL (ref 0.35–5.50)

## 2021-08-28 LAB — MICROALBUMIN / CREATININE URINE RATIO
Creatinine,U: 93.5 mg/dL
Microalb Creat Ratio: 7 mg/g (ref 0.0–30.0)
Microalb, Ur: 6.6 mg/dL — ABNORMAL HIGH (ref 0.0–1.9)

## 2021-08-28 NOTE — Assessment & Plan Note (Signed)
Newly diagnosed in the hospital setting.  Patient was placed on metformin 500 mg daily fasting glucoses around 1 20-1 30s.  Patient is working on diet and is exercising.  We will do an ambulatory referral to nutrition for evaluation and education.  Will not change medication at this point as patient is doing lifestyle modifications and fasting blood glucoses are in a good range we will recheck A1c at next office visit.

## 2021-08-28 NOTE — Patient Instructions (Signed)
Nice to see you today Check your blood pressure once a day. Check your sugar once a day fasting I want to see you in 2 months for a recheck, sooner if you need me I will refer you to a Nutritionist

## 2021-08-28 NOTE — Progress Notes (Signed)
New Patient Office Visit  Subjective    Patient ID: Karen Miller, female    DOB: [birth year]  Age: 60 y.o. MRN: [id number]  CC:  Chief Complaint  Patient presents with   Establish Care    No previous PCP-is seen cardiologist through [facility]   Diabetes    Follow up-readings averaging 130-fasting.     HPI Karen Miller presents to establish care   DM2: States that she was dx with dm in the hospitla. States that she dose walk daily with 1.5-2 miles daily weather premiting 2 meals a day.She does snack but has cut back on sugar and sodium. Now drinking just water and a small can of zero ginger ale twice a week. Coffee with cream   CHF: Diagnosis of acute CHF in the hospital setting EF did return normal patient is being followed by cardiology.  She did have a CT calcium score that did show a score of 204 with normal coronary origin with right dominance.  Did have aortic atherosclerosis noted on exam.  Echocardiogram did show left ventricle hypertrophy.  HTN: 159/86 this am with 79. Bp averaging 150/80s at home. Heart rate 80-83  Obesity: See above about exercise and diet modifications  Mammogram: [place] needs order Colonoscopy: [place], needs order Unsure of last pap smear  Outpatient Encounter Medications as of 08/28/2021  Medication Sig   aspirin 81 MG EC tablet Take 1 tablet (81 mg total) by mouth daily. Swallow whole.   atorvastatin (LIPITOR) 40 MG tablet Take 1 tablet (40 mg total) by mouth daily.   hydrochlorothiazide (HYDRODIURIL) 25 MG tablet Take 1 tablet (25 mg total) by mouth daily.   losartan (COZAAR) 100 MG tablet Take 1 tablet (100 mg total) by mouth daily.   metFORMIN (GLUCOPHAGE) 500 MG tablet Take 1 tablet (500 mg total) by mouth daily with breakfast.   metoprolol tartrate (LOPRESSOR) 50 MG tablet Take 1 tablet (50 mg total) by mouth 2 (two) times daily.   No facility-administered encounter medications on file as of 08/28/2021.    Past Medical History:   Diagnosis Date   Hypertension     Past Surgical History:  Procedure Laterality Date   FACIAL FRACTURE SURGERY  2008   right side of the face completely plated-crushed her facial bones on that side   OTHER SURGICAL HISTORY  2008   right arm fractured in multiple areas-from right elbow down to right wrist plated and screws   UMBILICAL HERNIA REPAIR     has mesh    Family History  Problem Relation Age of Onset   Diabetes Mother    Diabetes Sister    Thyroid disease Sister    Thyroid disease Sister    Hypertension Sister    Heart attack Sister        x 2-stents   Diabetes Brother    Other Brother        enlarged heart and has defibrillator    Social History   Socioeconomic History   Marital status: Married    Spouse name: Not on file   Number of children: 2   Years of education: Not on file   Highest education level: Not on file  Occupational History   Not on file  Tobacco Use   Smoking status: Never   Smokeless tobacco: Never  Vaping Use   Vaping Use: Never used  Substance and Sexual Activity   Alcohol use: Never   Drug use: Never   Sexual activity: Not on file  Other Topics Concern   Not on file  Social History Narrative   Retired: worked for the state for [place] Strain: Not on [employer] Insecurity: Not on file  Transportation Needs: Not on file  Physical Activity: Not on file  Stress: Not on file  Social Connections: Not on file  Intimate Partner Violence: Not on file    Review of Systems  Constitutional:  Negative for chills, fever and malaise/fatigue.  Gastrointestinal:  Positive for constipation. Negative for diarrhea, nausea and vomiting.       BM 2-3 days between   Genitourinary:  Negative for dysuria and hematuria.  Neurological:  Negative for dizziness, tingling and headaches.  Psychiatric/Behavioral:  Negative for hallucinations and suicidal ideas.         Objective    BP (!) 182/94    Pulse (!) 105   Temp 97.7 F (36.5 C)   Resp 16   Ht '5\' 4"'$  (1.626 m)   Wt 254 lb 1 oz (115.2 kg)   SpO2 97%   BMI 43.61 kg/m   Physical Exam Vitals and nursing note reviewed.  Constitutional:      Appearance: Normal appearance. She is obese.  HENT:     Right Ear: Tympanic membrane, ear canal and external ear normal.     Left Ear: Tympanic membrane, ear canal and external ear normal.     Mouth/Throat:     Mouth: Mucous membranes are moist.     Pharynx: Oropharynx is clear.  Cardiovascular:     Rate and Rhythm: Normal rate and regular rhythm.     Pulses: Normal pulses.     Heart sounds: Normal heart sounds.  Pulmonary:     Effort: Pulmonary effort is normal.     Breath sounds: Normal breath sounds.  Musculoskeletal:     Right lower leg: Edema present.     Left lower leg: Edema present.  Lymphadenopathy:     Cervical: No cervical adenopathy.  Skin:    General: Skin is warm.  Neurological:     Mental Status: She is alert.         Assessment & Plan:   Problem List Items Addressed This Visit       Cardiovascular and Mediastinum   Hypertension associated with diabetes [facility])    Patient was admitted for hypertensive urgency.  Hypertensive in office she is having at home readings in the 150s over 80s.  Recently seen by cardiology and losartan increased from 50 mg to 100 mg on 13 June.  She was curious about increasing her medication more told her it feels too soon to do such.  We will CC cardiology on note today.  Continue taking losartan, hydrochlorothiazide, metoprolol as prescribed follow-up with cardiology as recommended.  No red flags on exam today      Relevant Orders   Microalbumin/Creatinine Ratio, Urine   Amb ref to Medical Nutrition Therapy-MNT     Endocrine   Type 2 diabetes mellitus with hyperlipidemia ([place])    Newly diagnosed in the hospital setting.  Patient was placed on metformin 500 mg daily fasting glucoses around 1 20-1 30s.  Patient is working on  diet and is exercising.  We will do an ambulatory referral to nutrition for evaluation and education.  Will not change medication at this point as patient is doing lifestyle modifications and fasting blood glucoses are in a good range we will recheck A1c at next office visit.  Relevant Orders   Microalbumin/Creatinine Ratio, Urine   Amb ref to Medical Nutrition Therapy-MNT     Other   Hair thinning - Primary    Patient is concerned about her thyroid function.  States she is having having thinning hair and has a strong family history of folks with thyroid disorders.  Check TSH today, pending result.      Relevant Orders   TSH   Other Visit Diagnoses     Screening for colon cancer       Relevant Orders   Ambulatory referral to Gastroenterology   Encounter for screening mammogram for malignant neoplasm of breast       Relevant Orders   MM Digital Screening       Return in about 2 months (around 10/28/2021) for DM recheck .   Romilda Garret, NP

## 2021-08-28 NOTE — Assessment & Plan Note (Signed)
Patient is concerned about her thyroid function.  States she is having having thinning hair and has a strong family history of folks with thyroid disorders.  Check TSH today, pending result.

## 2021-08-28 NOTE — Assessment & Plan Note (Signed)
Patient was admitted for hypertensive urgency.  Hypertensive in office she is having at home readings in the 150s over 80s.  Recently seen by cardiology and losartan increased from 50 mg to 100 mg on 13 June.  She was curious about increasing her medication more told her it feels too soon to do such.  We will CC cardiology on note today.  Continue taking losartan, hydrochlorothiazide, metoprolol as prescribed follow-up with cardiology as recommended.  No red flags on exam today

## 2021-09-04 ENCOUNTER — Ambulatory Visit (INDEPENDENT_AMBULATORY_CARE_PROVIDER_SITE_OTHER): Payer: BC Managed Care – PPO | Admitting: Nurse Practitioner

## 2021-09-04 ENCOUNTER — Encounter: Payer: Self-pay | Admitting: Nurse Practitioner

## 2021-09-04 VITALS — BP 160/88 | HR 86 | Ht 64.0 in | Wt 253.0 lb

## 2021-09-04 DIAGNOSIS — E785 Hyperlipidemia, unspecified: Secondary | ICD-10-CM

## 2021-09-04 DIAGNOSIS — I3481 Nonrheumatic mitral (valve) annulus calcification: Secondary | ICD-10-CM | POA: Diagnosis not present

## 2021-09-04 DIAGNOSIS — Z6841 Body Mass Index (BMI) 40.0 and over, adult: Secondary | ICD-10-CM

## 2021-09-04 DIAGNOSIS — I2510 Atherosclerotic heart disease of native coronary artery without angina pectoris: Secondary | ICD-10-CM

## 2021-09-04 DIAGNOSIS — E1169 Type 2 diabetes mellitus with other specified complication: Secondary | ICD-10-CM

## 2021-09-04 DIAGNOSIS — I10 Essential (primary) hypertension: Secondary | ICD-10-CM | POA: Diagnosis not present

## 2021-09-04 DIAGNOSIS — I5032 Chronic diastolic (congestive) heart failure: Secondary | ICD-10-CM | POA: Diagnosis not present

## 2021-09-04 DIAGNOSIS — R000 Tachycardia, unspecified: Secondary | ICD-10-CM

## 2021-09-04 DIAGNOSIS — I340 Nonrheumatic mitral (valve) insufficiency: Secondary | ICD-10-CM

## 2021-09-04 DIAGNOSIS — I342 Nonrheumatic mitral (valve) stenosis: Secondary | ICD-10-CM

## 2021-09-04 MED ORDER — METOPROLOL TARTRATE 25 MG PO TABS: 25.0000 mg | ORAL_TABLET | Freq: Two times a day (BID) | ORAL | 3 refills | Status: DC

## 2021-09-04 MED ORDER — VALSARTAN 160 MG PO TABS: 160.0000 mg | ORAL_TABLET | Freq: Every day | ORAL | 3 refills | Status: DC

## 2021-09-04 NOTE — Patient Instructions (Signed)
Medication Instructions:  STOP Losartan as directed START Valsartan 160 mg daily as directed INCREASE Metoprolol 75 mg twice daily   *If you need a refill on your cardiac medications before your next appointment, please call your pharmacy*   Lab Work: Your physician recommends that you complete labs today. Fasting Lipid panel HFP  If you have labs (blood work) drawn today and your tests are completely normal, you will receive your results only by: MyChart Message (if you have MyChart) OR A paper copy in the mail If you have any lab test that is abnormal or we need to change your treatment, we will call you to review the results.   Testing/Procedures: NONE ordered at this time of appointment     Follow-Up: At [facility], you and your health needs are our priority.  As part of our continuing mission to provide you with exceptional heart care, we have created designated Provider Care Teams.  These Care Teams include your primary Cardiologist (physician) and Advanced Practice Providers (APPs -  Physician Assistants and Nurse Practitioners) who all work together to provide you with the care you need, when you need it.  We recommend signing up for the patient portal called "MyChart".  Sign up information is provided on this After Visit Summary.  MyChart is used to connect with patients for Virtual Visits (Telemedicine).  Patients are able to view lab/test results, encounter notes, upcoming appointments, etc.  Non-urgent messages can be sent to your provider as well.   To learn more about what you can do with MyChart, go to [email or web address].    Your next appointment:   1 month(s)  The format for your next appointment:   In Person  Provider:   Diona Browner, NP        Other Instructions Monitor Blood pressure.   Important Information About Sugar

## 2021-09-04 NOTE — Progress Notes (Signed)
Office Visit    Patient Name: Karen Miller Date of Encounter: 09/04/2021  Primary Care Provider:  Michela Pitcher, NP Primary Cardiologist:  Elouise Munroe, MD  Chief Complaint    61 year old female with a history of CAD, chronic diastolic heart failure, hypertension, hyperlipidemia, reported paroxysmal atrial fibrillation (no documented history), PVCs, type 2 diabetes, and obesity who presents for follow-up related to hypertension and heart failure.  Past Medical History    Past Medical History:  Diagnosis Date   Hypertension    Past Surgical History:  Procedure Laterality Date   FACIAL FRACTURE SURGERY  2008   right side of the face completely plated-crushed her facial bones on that side   OTHER SURGICAL HISTORY  2008   right arm fractured in multiple areas-from right elbow down to right wrist plated and screws   UMBILICAL HERNIA REPAIR     has mesh    Allergies  No Known Allergies  History of Present Illness    61 year old female with the above past medical history including CAD, chronic diastolic heart failure, hypertension, hyperlipidemia, reported paroxysmal atrial fibrillation (no documented history), PVCs, type 2 diabetes, and obesity.   She presented to the ED on 07/12/2021 with palpitations, tachycardia, elevated BP.  She presented to urgent care the day before and was told that she "might be in atrial fibrillation."  EKG from urgent care visit was unavailable for review, therefore, no documented history of atrial fibrillation.  EKG the ED showed sinus tachycardia, no evidence of ischemia, or arrhythmia.  Troponin was elevated.  CT of the chest was negative for PE.  Cardiology was consulted.  She was hospitalized from 07/12/2021 to 07/15/2021 in the setting of hypertensive urgency, acute diastolic heart failure.  Echocardiogram showed EF 65 to 70%, hyperdynamic LV function, severe LVH, indeterminate diastolic parameters, degenerative mitral valve, mild mitral valve  regurgitation, mild to moderate mitral stenosis, severe mitral annular calcification. Coronary CT angiogram revealed coronary calcium score 204, 94th percentile for age, race, and sex-matched controls, minimal plaques in the left main, LAD, and left circumflex arteries, mild plaque in the RCA, mitral annular calcification, and aortic atherosclerosis.  She was given furosemide and started on losartan and HCTZ.  Given presence of MAC, blood cultures were obtained however, these were negative. Therefore, TEE was not pursued given low suspicion for endocarditis with negative blood cultures.  She was also treated for a UTI.  A1c was 8.9 and she was diagnosed with new onset type 2 diabetes. She was discharged home in stable condition on 07/15/2021.   She was last seen in the office on 08/20/2021 and was stable from a cardiac standpoint.  BP was somewhat elevated.  She was also mildly tachycardic.  Losartan was increased to 100 mg daily.  She denied symptoms concerning for angina.  HR was somewhat elevated however, this thought to be related largely to anxiety. She presents today for follow-up. Since her last visit she has done well overall from a cardiac standpoint.  Her BP does remain elevated.  She has noted some intermittent elevated heart rates, she did report some blurry vision at one point during an episode where her heart rate was 118 bpm. She denies symptoms concerning for angina.  Other than her ongoing elevated BP and intermittent elevated HR, she denies any additional concerns today.  Home Medications    Current Outpatient Medications  Medication Sig Dispense Refill   aspirin 81 MG EC tablet Take 1 tablet (81 mg total) by  mouth daily. Swallow whole. 30 tablet 1   atorvastatin (LIPITOR) 40 MG tablet Take 1 tablet (40 mg total) by mouth daily. 30 tablet 1   hydrochlorothiazide (HYDRODIURIL) 25 MG tablet Take 1 tablet (25 mg total) by mouth daily. 30 tablet 1   metFORMIN (GLUCOPHAGE) 500 MG tablet Take 1  tablet (500 mg total) by mouth daily with breakfast. 30 tablet 1   metoprolol tartrate (LOPRESSOR) 25 MG tablet Take 1 tablet (25 mg total) by mouth 2 (two) times daily. 90 tablet 3   metoprolol tartrate (LOPRESSOR) 50 MG tablet Take 1 tablet (50 mg total) by mouth 2 (two) times daily. 60 tablet 1   valsartan (DIOVAN) 160 MG tablet Take 1 tablet (160 mg total) by mouth daily. 90 tablet 3   No current facility-administered medications for this visit.     Review of Systems    She denies chest pain, dyspnea, pnd, orthopnea, n, v, dizziness, syncope, edema, weight gain, or early satiety. All other systems reviewed and are otherwise negative except as noted above.   Physical Exam    VS:  BP (!) 160/88   Pulse 86   Ht _0  (1.626 m)   Wt 253 lb (114.8 kg)   SpO2 96%   BMI 43.43 kg/m  GEN: Well nourished, well developed, in no acute distress. HEENT: normal. Neck: Supple, no JVD, carotid bruits, or masses. Cardiac: RRR, no murmurs, rubs, or gallops. No clubbing, cyanosis, edema.  Radials/DP/PT 2+ and equal bilaterally.  Respiratory:  Respirations regular and unlabored, clear to auscultation bilaterally. GI: Obese, soft, nontender, nondistended, BS + x 4. MS: no deformity or atrophy. Skin: warm and dry, no rash. Neuro:  Strength and sensation are intact. Psych: Normal affect.  Accessory Clinical Findings    ECG personally reviewed by me today - No EKG in office today.   Lab Results  Component Value Date   WBC 9.8 07/15/2021   HGB 14.7 07/15/2021   HCT 44.6 07/15/2021   MCV 90.3 07/15/2021   PLT 293 07/15/2021   Lab Results  Component Value Date   CREATININE 0.90 08/20/2021   BUN 23 08/20/2021   NA 138 08/20/2021   K 4.2 08/20/2021   CL 96 08/20/2021   CO2 25 08/20/2021   Lab Results  Component Value Date   ALT 29 07/12/2021   AST 30 07/12/2021   ALKPHOS 99 07/12/2021   BILITOT 0.8 07/12/2021   Lab Results  Component Value Date   CHOL 231 (H) 07/12/2021   HDL 40  (L) 07/12/2021   LDLCALC 167 (H) 07/12/2021   TRIG 118 07/12/2021   CHOLHDL 5.8 07/12/2021    Lab Results  Component Value Date   HGBA1C 8.9 (H) 07/12/2021    Assessment & Plan    1. Hypertension: BP remains elevated above goal despite recent increase in losartan.  Stop losartan and start valsartan 160 mg daily.  Additionally, given recent elevated HR will increase metoprolol as below.  If BP remains elevated, consider increased dose of valsartan versus addition of amlodipine.  Continue to monitor BP and report BP consistently > 130/80.  Continue hydrochlorothiazide.   2. CAD: Coronary CT angiogram revealed coronary calcium score 204, 94th percentile for age, race, and sex-matched controls, minimal plaques in the left main, LAD, and left circumflex arteries, mild plaque in the RCA, mitral annular calcification, and aortic atherosclerosis. Stable with no anginal symptoms. No indication for ischemic evaluation. Continue aspirin, valsartan, metoprolol, hydrochlorothiazide, and Lipitor.  3. Acute on chronic  diastolic heart failure: Echo in 07/2021 showed EF 65 to 70%, hyperdynamic LV function, severe LVH, indeterminate diastolic parameters, degenerative mitral valve, mild mitral valve regurgitation, mild to moderate mitral stenosis, severe mitral annular calcification. Euvolemic and well compensated on exam.  No indication for loop diuretic at this time.   4. Valvular heart disease: Recent echo as above. 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 TEE was not pursued.  She appears to be asymptomatic.  Consider repeat echocardiogram in 1 year.   5. Reported paroxysmal atrial fibrillation/Sinus tachycardia: No documented evidence of atrial fibrillation.  She is not on anticoagulation.  She notes rare intermittent palpitations, intermittent elevated resting HR. She denies dizziness, presyncope, syncope.  Discussed possibility of outpatient cardiac  monitor, however, patient declines at this time.  Will increase metoprolol to 75 mg twice daily to see if this helps. Continue to monitor.   6. Hyperlipidemia: LDL was 167 in May 2023. Will repeat fasting lipids, LFTs today. Continue aspirin, Lipitor.   7. Type 2 diabetes/obesity: Newly diagnosed type 2 diabetes at most recent outpatient visit.  A1c was 8.9. Monitored and managed per PCP.  Encouraged ongoing lifestyle modifications with diet and exercise.   8. Disposition: Follow-up in  1 month.  Lenna Sciara, NP 09/04/2021, 8:54 AM

## 2021-09-05 LAB — LIPID PANEL
Chol/HDL Ratio: 3.1 ratio (ref 0.0–4.4)
Cholesterol, Total: 131 mg/dL (ref 100–199)
HDL: 42 mg/dL (ref >39)
LDL Chol Calc (NIH): 60 mg/dL (ref 0–99)
Triglycerides: 170 mg/dL — ABNORMAL HIGH (ref 0–149)
VLDL Cholesterol Cal: 29 mg/dL (ref 5–40)

## 2021-09-05 LAB — HEPATIC FUNCTION PANEL
ALT: 22 IU/L (ref 0–32)
AST: 21 IU/L (ref 0–40)
Albumin: 4.3 g/dL (ref 3.8–4.9)
Alkaline Phosphatase: 83 IU/L (ref 44–121)
Bilirubin Total: 0.5 mg/dL (ref 0.0–1.2)
Bilirubin, Direct: 0.19 mg/dL (ref 0.00–0.40)
Total Protein: 7.6 g/dL (ref 6.0–8.5)

## 2021-09-09 ENCOUNTER — Telehealth: Payer: Self-pay

## 2021-09-09 NOTE — Telephone Encounter (Signed)
Spoke with pt. Pt was notified of lab results and recommendations.  ?

## 2021-09-25 NOTE — Progress Notes (Signed)
Office Visit    Patient Name: Karen Miller Date of Encounter: 09/26/2021  Primary Care Provider:  Michela Pitcher, NP Primary Cardiologist:  Elouise Munroe, MD  Chief Complaint    61 year old female with a history of CAD, chronic diastolic heart failure, hypertension, hyperlipidemia, reported paroxysmal atrial fibrillation (no documented history), PVCs, type 2 diabetes, and obesity who presents for follow-up related to hypertension and heart failure.  Past Medical History    Past Medical History:  Diagnosis Date   Hypertension    Past Surgical History:  Procedure Laterality Date   FACIAL FRACTURE SURGERY  2008   right side of the face completely plated-crushed her facial bones on that side   OTHER SURGICAL HISTORY  2008   right arm fractured in multiple areas-from right elbow down to right wrist plated and screws   UMBILICAL HERNIA REPAIR     has mesh    Allergies  No Known Allergies  History of Present Illness    61 year old female with the above past medical history including CAD, chronic diastolic heart failure, hypertension, hyperlipidemia, reported paroxysmal atrial fibrillation (no documented history), PVCs, type 2 diabetes, and obesity.   She presented to the ED on 07/12/2021 with palpitations, tachycardia, elevated BP.  She presented to urgent care the day before and was told that she "might be in atrial fibrillation."  EKG from urgent care visit was unavailable for review, therefore, no documented history of atrial fibrillation.  EKG the ED showed sinus tachycardia, no evidence of ischemia, or arrhythmia. Troponin was elevated.  CT of the chest was negative for PE. Cardiology was consulted.  She was hospitalized from 07/12/2021 to 07/15/2021 in the setting of hypertensive urgency, acute diastolic heart failure.  Echocardiogram showed EF 65 to 70%, hyperdynamic LV function, severe LVH, indeterminate diastolic parameters, degenerative mitral valve, mild mitral valve  regurgitation, mild to moderate mitral stenosis, severe mitral annular calcification. Coronary CT angiogram revealed coronary calcium score 204, 94th percentile for age, race, and sex-matched controls, minimal plaques in the left main, LAD, and left circumflex arteries, mild plaque in the RCA, mitral annular calcification, and aortic atherosclerosis.  She was given furosemide and started on losartan and HCTZ.  Given presence of MAC, blood cultures were obtained however, these were negative. Therefore, TEE was not pursued given low suspicion for endocarditis with negative blood cultures.  She was also treated for a UTI.  A1c was 8.9 and she was diagnosed with new onset type 2 diabetes.    She was last seen in the office on 09/04/2021 and was stable from a cardiac standpoint.  BP remained elevated despite prior titration of losartan.  She was also mildly tachycardic.  She declined cardiac monitor. She was transitioned from losartan to valsartan.  Metoprolol was increased in the setting of resting tachycardia.  She presents today for follow-up.  Since her last visit she has been stable from a cardiac standpoint.  She does note occasionally elevated BP, HR is much improved with readings in the 60-70 bpm.  Overall, she reports feeling well and denies any new concerns today.  Home Medications    Current Outpatient Medications  Medication Sig Dispense Refill   aspirin 81 MG EC tablet Take 1 tablet (81 mg total) by mouth daily. Swallow whole. 30 tablet 1   atorvastatin (LIPITOR) 40 MG tablet Take 1 tablet (40 mg total) by mouth daily. 30 tablet 1   hydrochlorothiazide (HYDRODIURIL) 25 MG tablet Take 1 tablet (25 mg total) by mouth  daily. 30 tablet 1   metFORMIN (GLUCOPHAGE) 500 MG tablet Take 1 tablet (500 mg total) by mouth daily with breakfast. 30 tablet 1   metoprolol tartrate (LOPRESSOR) 25 MG tablet Take 1 tablet (25 mg total) by mouth 2 (two) times daily. 180 tablet 3   metoprolol tartrate (LOPRESSOR) 50  MG tablet Take 1 tablet (50 mg total) by mouth 2 (two) times daily. 60 tablet 1   valsartan (DIOVAN) 320 MG tablet Take 1 tablet (320 mg total) by mouth daily. 90 tablet 3   No current facility-administered medications for this visit.     Review of Systems    She denies chest pain, palpitations, dyspnea, pnd, orthopnea, n, v, dizziness, syncope, edema, weight gain, or early satiety. All other systems reviewed and are otherwise negative except as noted above.    Physical Exam    VS:  BP 140/88   Pulse 99   Ht _0  (1.626 m)   Wt 255 lb 3.2 oz (115.8 kg)   SpO2 99%   BMI 43.80 kg/m  GEN: Well nourished, well developed, in no acute distress. HEENT: normal. Neck: Supple, no JVD, carotid bruits, or masses. Cardiac: RRR, no murmurs, rubs, or gallops. No clubbing, cyanosis, edema.  Radials/DP/PT 2+ and equal bilaterally.  Respiratory:  Respirations regular and unlabored, clear to auscultation bilaterally. GI: Soft, nontender, nondistended, BS + x 4. MS: no deformity or atrophy. Skin: warm and dry, no rash. Neuro:  Strength and sensation are intact. Psych: Normal affect.  Accessory Clinical Findings    ECG personally reviewed by me today - No EKG in office today.  Lab Results  Component Value Date   WBC 9.8 07/15/2021   HGB 14.7 07/15/2021   HCT 44.6 07/15/2021   MCV 90.3 07/15/2021   PLT 293 07/15/2021   Lab Results  Component Value Date   CREATININE 0.90 08/20/2021   BUN 23 08/20/2021   NA 138 08/20/2021   K 4.2 08/20/2021   CL 96 08/20/2021   CO2 25 08/20/2021   Lab Results  Component Value Date   ALT 22 09/04/2021   AST 21 09/04/2021   ALKPHOS 83 09/04/2021   BILITOT 0.5 09/04/2021   Lab Results  Component Value Date   CHOL 131 09/04/2021   HDL 42 09/04/2021   LDLCALC 60 09/04/2021   TRIG 170 (H) 09/04/2021   CHOLHDL 3.1 09/04/2021    Lab Results  Component Value Date   HGBA1C 8.9 (H) 07/12/2021    Assessment & Plan    1. Hypertension: BP  elevated slightly above goal in office today.  Will increase valsartan to 320 mg daily.  If BP remains elevated, consider addition of amlodipine. Will repeat BMET in 2 weeks. Continue to monitor BP and report BP consistently > 130/80.  Continue hydrochlorothiazide.   2. CAD: Coronary CT angiogram revealed coronary calcium score 204, 94th percentile for age, race, and sex-matched controls, minimal plaques in the left main, LAD, and left circumflex arteries, mild plaque in the RCA, mitral annular calcification, and aortic atherosclerosis. Stable with no anginal symptoms. No indication for ischemic evaluation. Continue aspirin, valsartan, metoprolol, hydrochlorothiazide, and Lipitor.  3. Chronic diastolic heart failure: Echo in 07/2021 showed EF 65 to 70%, hyperdynamic LV function, severe LVH, indeterminate diastolic parameters, degenerative mitral valve, mild mitral valve regurgitation, mild to moderate mitral stenosis, severe mitral annular calcification. Euvolemic and well compensated on exam.  No indication for loop diuretic at this time.   4. Valvular heart disease: Recent echo  as above. 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 TEE was not pursued.  She is asymptomatic.  Consider repeat echocardiogram in 1 year.   5. Reported paroxysmal atrial fibrillation/Sinus tachycardia: No documented evidence of atrial fibrillation.  She is not on anticoagulation.  At her last visit she noted rare intermittent palpitations, intermittent elevated resting HR. Metoprolol was increased.  Discussed possibility of outpatient cardiac monitor, however, patient declined.  Continue metoprolol at current dose.  6. Hyperlipidemia: LDL was 60 in June 2023.  Continue aspirin, Lipitor.   7. Type 2 diabetes/obesity:  A1c was 8.9 in 07/2021. Monitored and managed per PCP.  Encouraged ongoing lifestyle modifications with diet and exercise.   8. Disposition: Follow-up  in 2 months.   Lenna Sciara, NP 09/26/2021, 8:46 AM

## 2021-09-26 ENCOUNTER — Ambulatory Visit (INDEPENDENT_AMBULATORY_CARE_PROVIDER_SITE_OTHER): Payer: BC Managed Care – PPO | Admitting: Nurse Practitioner

## 2021-09-26 ENCOUNTER — Other Ambulatory Visit: Payer: Self-pay

## 2021-09-26 ENCOUNTER — Encounter: Payer: Self-pay | Admitting: Nurse Practitioner

## 2021-09-26 VITALS — BP 140/88 | HR 99 | Ht 64.0 in | Wt 255.2 lb

## 2021-09-26 DIAGNOSIS — I10 Essential (primary) hypertension: Secondary | ICD-10-CM | POA: Diagnosis not present

## 2021-09-26 DIAGNOSIS — Z6841 Body Mass Index (BMI) 40.0 and over, adult: Secondary | ICD-10-CM

## 2021-09-26 DIAGNOSIS — I342 Nonrheumatic mitral (valve) stenosis: Secondary | ICD-10-CM

## 2021-09-26 DIAGNOSIS — I3481 Nonrheumatic mitral (valve) annulus calcification: Secondary | ICD-10-CM

## 2021-09-26 DIAGNOSIS — I340 Nonrheumatic mitral (valve) insufficiency: Secondary | ICD-10-CM

## 2021-09-26 DIAGNOSIS — E785 Hyperlipidemia, unspecified: Secondary | ICD-10-CM

## 2021-09-26 DIAGNOSIS — I480 Paroxysmal atrial fibrillation: Secondary | ICD-10-CM

## 2021-09-26 DIAGNOSIS — I5032 Chronic diastolic (congestive) heart failure: Secondary | ICD-10-CM

## 2021-09-26 DIAGNOSIS — E1169 Type 2 diabetes mellitus with other specified complication: Secondary | ICD-10-CM

## 2021-09-26 DIAGNOSIS — I2510 Atherosclerotic heart disease of native coronary artery without angina pectoris: Secondary | ICD-10-CM

## 2021-09-26 LAB — HM DIABETES EYE EXAM

## 2021-09-26 MED ORDER — VALSARTAN 320 MG PO TABS: 320.0000 mg | ORAL_TABLET | Freq: Every day | ORAL | 3 refills | Status: DC

## 2021-09-26 NOTE — Patient Instructions (Signed)
Medication Instructions:  Increase Valsartan 320 mg daily  *If you need a refill on your cardiac medications before your next appointment, please call your pharmacy*   Lab Work: Your physician recommends that you return for lab work in 2 weeks. BMET  If you have labs (blood work) drawn today and your tests are completely normal, you will receive your results only by: [facility] (if you have MyChart) OR A paper copy in the mail If you have any lab test that is abnormal or we need to change your treatment, we will call you to review the results.   Testing/Procedures: NONE ordered at this time of appointment    Follow-Up: At [facility], you and your health needs are our priority.  As part of our continuing mission to provide you with exceptional heart care, we have created designated Provider Care Teams.  These Care Teams include your primary Cardiologist (physician) and Advanced Practice Providers (APPs -  Physician Assistants and Nurse Practitioners) who all work together to provide you with the care you need, when you need it.  We recommend signing up for the patient portal called "MyChart".  Sign up information is provided on this After Visit Summary.  MyChart is used to connect with patients for Virtual Visits (Telemedicine).  Patients are able to view lab/test results, encounter notes, upcoming appointments, etc.  Non-urgent messages can be sent to your provider as well.   To learn more about what you can do with MyChart, go to [email or web address].    Your next appointment:   2 month(s)  The format for your next appointment:   In Person  Provider:   Diona Browner, NP        Other Instructions   Important Information About Sugar

## 2021-10-07 ENCOUNTER — Other Ambulatory Visit: Payer: Self-pay | Admitting: Nurse Practitioner

## 2021-10-09 ENCOUNTER — Other Ambulatory Visit: Payer: Self-pay | Admitting: Nurse Practitioner

## 2021-10-10 ENCOUNTER — Other Ambulatory Visit: Payer: Self-pay | Admitting: *Deleted

## 2021-10-10 ENCOUNTER — Encounter: Payer: BC Managed Care – PPO | Attending: Nurse Practitioner | Admitting: Dietician

## 2021-10-10 ENCOUNTER — Encounter: Payer: Self-pay | Admitting: Dietician

## 2021-10-10 DIAGNOSIS — E785 Hyperlipidemia, unspecified: Secondary | ICD-10-CM | POA: Insufficient documentation

## 2021-10-10 DIAGNOSIS — E1169 Type 2 diabetes mellitus with other specified complication: Secondary | ICD-10-CM | POA: Diagnosis present

## 2021-10-10 LAB — BASIC METABOLIC PANEL
BUN/Creatinine Ratio: 28 (ref 12–28)
BUN: 22 mg/dL (ref 8–27)
CO2: 28 mmol/L (ref 20–29)
Calcium: 9.7 mg/dL (ref 8.7–10.3)
Chloride: 98 mmol/L (ref 96–106)
Creatinine, Ser: 0.78 mg/dL (ref 0.57–1.00)
Glucose: 105 mg/dL — ABNORMAL HIGH (ref 70–99)
Potassium: 4.5 mmol/L (ref 3.5–5.2)
Sodium: 138 mmol/L (ref 134–144)
eGFR: 86 mL/min/{1.73_m2} (ref >59)

## 2021-10-10 MED ORDER — ATORVASTATIN CALCIUM 40 MG PO TABS: 40.0000 mg | ORAL_TABLET | Freq: Every day | ORAL | 1 refills | Status: DC

## 2021-10-10 NOTE — Progress Notes (Signed)
Medical Nutrition Therapy  Appointment Start time:  [phone]  Appointment End time:  49  Primary concerns today: Healthy eating and learning more about diabetes.  Asked her doctor for a class. Fasting blood glucose 123 and blood pressure 147/67 this am Referral diagnosis: Type 2 diabetes and HTN, "bleeding behind the eyes" Preferred learning style: no preference indicated Learning readiness: ready, change in progress   NUTRITION ASSESSMENT   Anthropometrics  64" 258 lbs 10/10/2021  254 lbs 08/28/2021  Clinical Medical Hx: Type 2 Diabetes, HTN, constipation (but improved when she eats from the garden). Medications: see list to include Metformin Labs: A1C 8.5% 07/12/2021, eGFR 73 (08/20/2021), cholesterol 131, HDL 42, Triglycerides 170, LDL 60 (09/04/2021) Notable Signs/Symptoms: none  Lifestyle & Dietary Hx Patient lives with her husband.  Patient does the shopping and cooking.  They are both retired.  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  They have a garden (1/2 acre) and are growing corn, lima beans, peas, squash, cabbage, tomatoes, green beans Avoids pork.  Following a low sodium diet.  Avoiding biscuits.  Stopped most snacking.    Estimated daily fluid intake:  Supplements: none Sleep: 5 pm-12-1 am  (8 hours) Stress / self-care: low Current average weekly physical activity: walks at 3:3 for 45-60 minutes daily Does not tolerate sun or heat.  24-Hr Dietary Recall Used to snack all day long prior to diabetes First Meal: 5 am (eats out daily) :  egg sandwich OR occasional gravy biscuit OR [employer] toast and eggs Snack: none Second Meal: none Snack: sugar free popsicle, peanuts or walnuts Third Meal: 2:30 pm - tomato sandwich on [employer] with mayo OR vegetables, meat, bread Snack: none Beverages: water, 1 cup coffee with half and half  NUTRITION DIAGNOSIS  NB-1.1 Food and nutrition-related knowledge deficit As related to balance of carbohydrates, protein, and fat.  As  evidenced by diet hx and patient report.   NUTRITION INTERVENTION  Nutrition education (E-1) on the following topics:  Pathophysiology of diabetes Treatment of diabetes  Review of medications Educated on blood glucose goals Educated on importance of exercise/activity Recommended continued low sodium diet and avoid all potassium containing salt substitutes Balance of carbohydrates, protein, and fat My plate  Handouts Provided Include  How to Thrive:  A Guide for Your Journey with Diabetes by the ADA Meal Plan Card Snack list Label reading Diabetes Resources  Learning Style & Readiness for Change Teaching method utilized: Visual & Auditory  Demonstrated degree of understanding via: Teach Back  Barriers to learning/adherence to lifestyle change: none  Goals Consider getting your vitamin D checked.  Blood glucose goals:      Fasting 80-130      2 hours after you start a meal (Less than 180)      If your blood glucose is higher than 180 after a meal, identify what you ate and the portion to see if this could be a cause.  Stay active and get light exercise.  Continue to stay active.  You are doing great with walking!  Continue a low sodium diet. Avoid salt substitutes such as "No Salt" and "Lite Salt" and others that have potassium in the ingredients list.  Balance your meals. Include a snack daily as you do not eat lunch. Continue to have increased amounts of non-starchy vegetables.  Aim for 2-3 Carb Choices per meal (30-45 grams) +/- 1 either way  Aim for 0-30 Carbs per snack if hungry  Include protein in moderation with  your meals and snacks Consider reading food labels for Total Carbohydrate of foods Consider checking BG at alternate times per day  Continue taking medication as directed by MD   MONITORING & EVALUATION Dietary intake, weekly physical activity prn.  Next Steps  Patient is to call for questions.

## 2021-10-10 NOTE — Patient Instructions (Addendum)
Consider getting your vitamin D checked.  Blood glucose goals:      Fasting 80-130      2 hours after you start a meal (Less than 180)      If your blood glucose is higher than 180 after a meal, identify what you ate and the portion to see if this could be a cause.  Stay active and get light exercise.  Continue to stay active.  You are doing great with walking!  Continue a low sodium diet. Avoid salt substitutes such as "No Salt" and "Lite Salt" and others that have potassium in the ingredients list.  Balance your meals. Include a snack daily as you do not eat lunch. Continue to have increased amounts of non-starchy vegetables.  Aim for 2-3 Carb Choices per meal (30-45 grams) +/- 1 either way  Aim for 0-30 Carbs per snack if hungry  Include protein in moderation with your meals and snacks Consider reading food labels for Total Carbohydrate of foods Consider checking BG at alternate times per day  Continue taking medication as directed by MD

## 2021-10-18 ENCOUNTER — Telehealth: Payer: Self-pay | Admitting: Nurse Practitioner

## 2021-10-18 ENCOUNTER — Other Ambulatory Visit: Payer: Self-pay

## 2021-10-18 ENCOUNTER — Encounter: Payer: Self-pay | Admitting: Ophthalmology

## 2021-10-18 MED ORDER — METOPROLOL TARTRATE 50 MG PO TABS: 50.0000 mg | ORAL_TABLET | Freq: Two times a day (BID) | ORAL | 1 refills | Status: DC

## 2021-10-18 NOTE — Telephone Encounter (Signed)
Called patient to notify medication has be sent to desired pharmacy. Patient verbalized understanding.

## 2021-10-18 NOTE — Telephone Encounter (Signed)
*  STAT* If patient is at the pharmacy, call can be transferred to refill team.   1. Which medications need to be refilled? (please list name of each medication and dose if known) metoprolol tartrate (LOPRESSOR) 50 MG tablet  2. Which pharmacy/location (including street and city if local pharmacy) is medication to be sent to? [place], [place] - [address]  3. Do they need a 30 day or 90 day supply? 30 day  Patient only has enough medication for tomorrow.

## 2021-10-28 ENCOUNTER — Encounter: Payer: Self-pay | Admitting: Nurse Practitioner

## 2021-10-28 ENCOUNTER — Ambulatory Visit (INDEPENDENT_AMBULATORY_CARE_PROVIDER_SITE_OTHER): Payer: BC Managed Care – PPO | Admitting: Nurse Practitioner

## 2021-10-28 VITALS — BP 168/90 | HR 84 | Temp 98.0°F | Resp 12 | Ht 64.0 in | Wt 255.4 lb

## 2021-10-28 DIAGNOSIS — E785 Hyperlipidemia, unspecified: Secondary | ICD-10-CM | POA: Diagnosis not present

## 2021-10-28 DIAGNOSIS — I10 Essential (primary) hypertension: Secondary | ICD-10-CM | POA: Diagnosis not present

## 2021-10-28 DIAGNOSIS — E1159 Type 2 diabetes mellitus with other circulatory complications: Secondary | ICD-10-CM

## 2021-10-28 DIAGNOSIS — Z0000 Encounter for general adult medical examination without abnormal findings: Secondary | ICD-10-CM | POA: Insufficient documentation

## 2021-10-28 DIAGNOSIS — Z0001 Encounter for general adult medical examination with abnormal findings: Secondary | ICD-10-CM | POA: Insufficient documentation

## 2021-10-28 DIAGNOSIS — E1169 Type 2 diabetes mellitus with other specified complication: Secondary | ICD-10-CM | POA: Diagnosis not present

## 2021-10-28 DIAGNOSIS — Z1321 Encounter for screening for nutritional disorder: Secondary | ICD-10-CM | POA: Insufficient documentation

## 2021-10-28 LAB — BASIC METABOLIC PANEL
BUN: 30 mg/dL — ABNORMAL HIGH (ref 6–23)
CO2: 27 mEq/L (ref 19–32)
Calcium: 10 mg/dL (ref 8.4–10.5)
Chloride: 97 mEq/L (ref 96–112)
Creatinine, Ser: 0.8 mg/dL (ref 0.40–1.20)
GFR: 79.7 mL/min (ref >60.00)
Glucose, Bld: 105 mg/dL — ABNORMAL HIGH (ref 70–99)
Potassium: 4 mEq/L (ref 3.5–5.1)
Sodium: 137 mEq/L (ref 135–145)

## 2021-10-28 LAB — POCT GLYCOSYLATED HEMOGLOBIN (HGB A1C): Hemoglobin A1C: 6.4 % — AB (ref 4.0–5.6)

## 2021-10-28 LAB — CBC
HCT: 37.9 % (ref 36.0–46.0)
Hemoglobin: 12.7 g/dL (ref 12.0–15.0)
MCHC: 33.6 g/dL (ref 30.0–36.0)
MCV: 93.6 fl (ref 78.0–100.0)
Platelets: 311 10*3/uL (ref 150.0–400.0)
RBC: 4.05 Mil/uL (ref 3.87–5.11)
RDW: 13.4 % (ref 11.5–15.5)
WBC: 8.6 10*3/uL (ref 4.0–10.5)

## 2021-10-28 LAB — VITAMIN D 25 HYDROXY (VIT D DEFICIENCY, FRACTURES): VITD: 11.29 ng/mL — ABNORMAL LOW (ref 30.00–100.00)

## 2021-10-28 NOTE — Patient Instructions (Signed)
Nice to see you today I will be in touch with the labs once I have them Follow up with me in 3 months for a recheck  Keep up the good work! Your A1C is 6.4 in office today

## 2021-10-28 NOTE — Assessment & Plan Note (Signed)
Patient has done well her A1c was 6.4% today.  We will maintain metformin 500 mg daily.  Patient can continue checking blood gas once daily fasting.  If she needs to check additionally because of how she feels that is okay.  Continue work on lifestyle modifications

## 2021-10-28 NOTE — Assessment & Plan Note (Signed)
Patient is still working on exercise..  States he is walking about 3 miles a day when she walks.  Did see dietitian/diabetic educator.  States she was wearing to 60 at that point to 61 in office.  Continue working on lifestyle modifications and weight loss.  We can always consider GLP-1 in the future since she does have diagnosis of CHF and type 2 diabetes

## 2021-10-28 NOTE — Assessment & Plan Note (Signed)
Patient is currently being followed by cardiology.  Patient currently maintained on valsartan, hydrochlorothiazide, and metoprolol.  Blood pressure still above goal today.  Patient is checking blood pressure daily at home.  Continue taking medication as prescribed.  Follow-up with cardiology as recommended

## 2021-10-28 NOTE — Assessment & Plan Note (Signed)
Patient did see nutritionist/diabetic educator.  She did recommend checking vitamin D level today.  Pending lab result

## 2021-10-28 NOTE — Progress Notes (Signed)
Established Patient Office Visit  Subjective   Patient ID: Karen Miller, female    DOB: Apr 19, 1960  Age: 61 y.o. MRN: [id number]  Chief Complaint  Patient presents with   Diabetes    Follow up-with newer meter sugar fasting at home has been in 150s and the older machine-about 61 years old-readings have been 120s, 130s. In the evening readings have been 160s, 170s      DM2: newly dx in the hospital setting and was placed on metformin '500mg'$ . States that she feels that her glucose are higher in the evening.  States they are still walking approximately 3 miles now states she will not walk in the morning she has early doctors appointments like today. Has been checking fasting at 155 -her highest reading 123 -lowest reading  HTN: she will check it once a day at home.  This is being managed by cardiology the patient has taken her morning medications she is currently on valsartan, hydrochlorothiazide, and metoprolol.  Has appoint with cardiology next month  Eye: 09/27/2021.f/u 6 months with hypertensive eyes.  States she had some bleeding behind the eyes     Review of Systems  Constitutional:  Negative for chills and fever.  Eyes:        States that when her heart rate is up her eyes are sensitive to light   Respiratory:  Negative for shortness of breath.   Cardiovascular:  Positive for leg swelling. Negative for chest pain.  Neurological:  Negative for headaches.      Objective:     BP (!) 168/90   Pulse 84   Temp 98 F (36.7 C)   Resp 12   Ht '5\' 4"'$  (1.626 m)   Wt 255 lb 6 oz (115.8 kg)   SpO2 98%   BMI 43.84 kg/m  BP Readings from Last 3 Encounters:  10/28/21 (!) 168/90  09/26/21 140/88  09/04/21 (!) 160/88   Wt Readings from Last 3 Encounters:  10/28/21 255 lb 6 oz (115.8 kg)  10/10/21 258 lb (117 kg)  09/26/21 255 lb 3.2 oz (115.8 kg)      Physical Exam Vitals and nursing note reviewed.  Constitutional:      Appearance: Normal appearance. She is obese.   Cardiovascular:     Rate and Rhythm: Normal rate and regular rhythm.     Heart sounds: Normal heart sounds.  Pulmonary:     Effort: Pulmonary effort is normal.     Breath sounds: Normal breath sounds.  Abdominal:     General: Bowel sounds are normal.  Musculoskeletal:     Right lower leg: Edema present.     Left lower leg: Edema present.  Feet:     Right foot:     Skin integrity: Dry skin present.     Left foot:     Skin integrity: Dry skin present.  Neurological:     Mental Status: She is alert.    Diabetic Foot Form - Detailed   Diabetic Foot Exam - detailed Diabetic Foot exam was performed with the following findings: Yes 10/28/2021  8:29 AM  Is there swelling or and abnormal foot shape?: Yes Is there a claw toe deformity?: No Is there elevated skin temparature?: No Pulse Foot Exam completed.: Yes   Right posterior Tibialias: Present Left posterior Tibialias: Present   Right Dorsalis Pedis: Present Left Dorsalis Pedis: Present  Sensory Foot Exam Completed.: Yes Semmes-Weinstein Monofilament Test   Comments: All 10 sites present bilaterally  Results for orders placed or performed in visit on 10/28/21  POCT glycosylated hemoglobin (Hb A1C)  Result Value Ref Range   Hemoglobin A1C 6.4 (A) 4.0 - 5.6 %   HbA1c POC (<> result, manual entry)     HbA1c, POC (prediabetic range)     HbA1c, POC (controlled diabetic range)        The 10-year ASCVD risk score (Arnett DK, et al., 2019) is: 13.6%    Assessment & Plan:   Problem List Items Addressed This Visit       Cardiovascular and Mediastinum   Hypertension associated with diabetes [facility])    Patient is currently being followed by cardiology.  Patient currently maintained on valsartan, hydrochlorothiazide, and metoprolol.  Blood pressure still above goal today.  Patient is checking blood pressure daily at home.  Continue taking medication as prescribed.  Follow-up with cardiology as recommended      Relevant  Orders   CBC   Basic metabolic panel     Endocrine   Type 2 diabetes mellitus with hyperlipidemia ([place]) - Primary    Patient has done well her A1c was 6.4% today.  We will maintain metformin 500 mg daily.  Patient can continue checking blood gas once daily fasting.  If she needs to check additionally because of how she feels that is okay.  Continue work on lifestyle modifications      Relevant Orders   POCT glycosylated hemoglobin (Hb A1C) (Completed)   CBC   Basic metabolic panel     Other   Obesity, Class III, BMI 40-49.9 (morbid obesity) ([place])    Patient is still working on exercise..  States he is walking about 3 miles a day when she walks.  Did see dietitian/diabetic educator.  States she was wearing to 60 at that point to 33 in office.  Continue working on lifestyle modifications and weight loss.  We can always consider GLP-1 in the future since she does have diagnosis of CHF and type 2 diabetes      Encounter for vitamin deficiency screening    Patient did see nutritionist/diabetic educator.  She did recommend checking vitamin D level today.  Pending lab result      Relevant Orders   VITAMIN D 25 Hydroxy (Vit-D Deficiency, Fractures)    Return in about 3 months (around 01/28/2022) for DM recheck.    Romilda Garret, NP

## 2021-10-29 ENCOUNTER — Other Ambulatory Visit: Payer: Self-pay | Admitting: Nurse Practitioner

## 2021-10-29 DIAGNOSIS — E559 Vitamin D deficiency, unspecified: Secondary | ICD-10-CM

## 2021-10-29 MED ORDER — VITAMIN D (ERGOCALCIFEROL) 1.25 MG (50000 UNIT) PO CAPS: 50000.0000 [IU] | ORAL_CAPSULE | ORAL | 0 refills | Status: DC

## 2021-11-07 ENCOUNTER — Ambulatory Visit
Admission: RE | Admit: 2021-11-07 | Discharge: 2021-11-07 | Disposition: A | Discharge status: Home / Self Care | Payer: BC Managed Care – PPO | Source: Ambulatory Visit | Attending: Nurse Practitioner | Admitting: Nurse Practitioner

## 2021-11-07 DIAGNOSIS — Z1231 Encounter for screening mammogram for malignant neoplasm of breast: Secondary | ICD-10-CM | POA: Diagnosis present

## 2021-11-08 ENCOUNTER — Other Ambulatory Visit: Payer: Self-pay | Admitting: Nurse Practitioner

## 2021-11-08 DIAGNOSIS — R928 Other abnormal and inconclusive findings on diagnostic imaging of breast: Secondary | ICD-10-CM

## 2021-11-08 DIAGNOSIS — N63 Unspecified lump in unspecified breast: Secondary | ICD-10-CM

## 2021-11-18 ENCOUNTER — Ambulatory Visit: Payer: BC Managed Care – PPO | Admitting: Registered"

## 2021-11-27 ENCOUNTER — Ambulatory Visit
Admission: RE | Admit: 2021-11-27 | Discharge: 2021-11-27 | Disposition: A | Discharge status: Home / Self Care | Payer: BC Managed Care – PPO | Source: Ambulatory Visit | Attending: Nurse Practitioner | Admitting: Nurse Practitioner

## 2021-11-27 DIAGNOSIS — N63 Unspecified lump in unspecified breast: Secondary | ICD-10-CM | POA: Insufficient documentation

## 2021-11-27 DIAGNOSIS — R928 Other abnormal and inconclusive findings on diagnostic imaging of breast: Secondary | ICD-10-CM | POA: Insufficient documentation

## 2021-11-27 NOTE — Progress Notes (Unsigned)
Office Visit    Patient Name: Karen Miller Date of Encounter: 11/28/2021  Primary Care Provider:  Michela Pitcher, NP Primary Cardiologist:  Elouise Munroe, MD  Chief Complaint    61 year old female with a history of CAD, chronic diastolic heart failure, hypertension, hyperlipidemia, reported paroxysmal atrial fibrillation (no documented history), PVCs, type 2 diabetes, and obesity who presents for follow-up related to hypertension and heart failure.  Past Medical History    Past Medical History:  Diagnosis Date   Diabetes mellitus without complication ([place])    Hypertension    Past Surgical History:  Procedure Laterality Date   FACIAL FRACTURE SURGERY  2008   right side of the face completely plated-crushed her facial bones on that side   OTHER SURGICAL HISTORY  2008   right arm fractured in multiple areas-from right elbow down to right wrist plated and screws   UMBILICAL HERNIA REPAIR     has mesh    Allergies  No Known Allergies  History of Present Illness    61 year old female with the above past medical history including CAD, chronic diastolic heart failure, hypertension, hyperlipidemia, reported paroxysmal atrial fibrillation (no documented history), PVCs, type 2 diabetes, and obesity.   She hospitalized in May 2023 in the setting of hypertensive urgency, acute diastolic heart failure. She valuated at urgent care prior to her hospitalization and was told that she "might be in atrial fibrillation."  EKG from urgent care visit was unavailable for review, therefore, no documented history of atrial fibrillation.  EKG the ED showed sinus tachycardia, no evidence of ischemia, or arrhythmia. Troponin was elevated.  CT of the chest was negative for PE. Cardiology was consulted. Echocardiogram showed EF 65 to 70%, hyperdynamic LV function, severe LVH, indeterminate diastolic parameters, degenerative mitral valve, mild mitral valve regurgitation, mild to moderate mitral  stenosis, severe mitral annular calcification. Coronary CT angiogram revealed coronary calcium score 204, 94th percentile for age, race, and sex-matched controls, minimal plaques in the left main, LAD, and left circumflex arteries, mild plaque in the RCA, mitral annular calcification, and aortic atherosclerosis.  She was given furosemide and started on losartan and HCTZ.  Given presence of MAC, blood cultures were obtained however, these were negative. Therefore, TEE was not pursued given low suspicion for endocarditis with negative blood cultures. A1c was 8.9 and she was diagnosed with new onset type 2 diabetes.  Her follow-up visit in June 2023 HR was noted to be elevated, metoprolol was increased.   She was last seen in the office on 09/26/2021 and was stable from a cardiac standpoint.  BP was slightly elevated.  Valsartan was increased to 320 mg daily. She presents today for follow-up. Since her last visit she has been stable from a cardiac standpoint.  She has noticed recent sensitivity to light she feels like this happens when her heart rate is elevated.  She denies any palpitations, presyncope, syncope.  She notes stable non-pitting dependent bilateral lower extremity edema.  She denies dyspnea, PND, orthopnea, weight gain.  Her BP remains elevated above goal.  She is pending colonoscopy.  She was told she would need cardiac clearance for this procedure.  Otherwise, she reports feeling well and denies any additional concerns today.  Home Medications    Current Outpatient Medications  Medication Sig Dispense Refill   atorvastatin (LIPITOR) 40 MG tablet Take 1 tablet (40 mg total) by mouth daily. 30 tablet 1   carvedilol (COREG) 12.5 MG tablet Take 1 tablet (12.5 mg total)  by mouth 2 (two) times daily. 180 tablet 3   hydrochlorothiazide (HYDRODIURIL) 25 MG tablet TAKE 1 TABLET DAILY 90 tablet 1   metFORMIN (GLUCOPHAGE) 500 MG tablet TAKE 1 TABLET DAILY WITH BREAKFAST 90 tablet 0   QC LO-DOSE ASPIRIN  81 MG tablet TAKE 1 TABLET DAILY - SWALLOW WHOLE 90 tablet 1   valsartan (DIOVAN) 320 MG tablet Take 1 tablet (320 mg total) by mouth daily. 90 tablet 3   Vitamin D, Ergocalciferol, (DRISDOL) 1.25 MG (50000 UNIT) CAPS capsule Take 1 capsule (50,000 Units total) by mouth every 7 (seven) days. 12 capsule 0   No current facility-administered medications for this visit.     Review of Systems    She denies chest pain, palpitations, dyspnea, pnd, orthopnea, n, v, dizziness, syncope, weight gain, or early satiety. All other systems reviewed and are otherwise negative except as noted above.   Physical Exam    VS:  BP 136/84   Pulse 88   Ht _0  (1.626 m)   Wt 255 lb 6.4 oz (115.8 kg)   SpO2 99%   BMI 43.84 kg/m   GEN: Well nourished, well developed, in no acute distress. HEENT: normal. Neck: Supple, no JVD, carotid bruits, or masses. Cardiac: RRR, no murmurs, rubs, or gallops. No clubbing, cyanosis, very mild nonpitting bilateral lower extremity edema.  Radials/DP/PT 2+ and equal bilaterally.  Respiratory:  Respirations regular and unlabored, clear to auscultation bilaterally. GI: Soft, nontender, nondistended, BS + x 4. MS: no deformity or atrophy. Skin: warm and dry, no rash. Neuro:  Strength and sensation are intact. Psych: Normal affect.  Accessory Clinical Findings    ECG personally reviewed by me today - No EKG in office today - no acute changes.   Lab Results  Component Value Date   WBC 8.6 10/28/2021   HGB 12.7 10/28/2021   HCT 37.9 10/28/2021   MCV 93.6 10/28/2021   PLT 311.0 10/28/2021   Lab Results  Component Value Date   CREATININE 0.80 10/28/2021   BUN 30 (H) 10/28/2021   NA 137 10/28/2021   K 4.0 10/28/2021   CL 97 10/28/2021   CO2 27 10/28/2021   Lab Results  Component Value Date   ALT 22 09/04/2021   AST 21 09/04/2021   ALKPHOS 83 09/04/2021   BILITOT 0.5 09/04/2021   Lab Results  Component Value Date   CHOL 131 09/04/2021   HDL 42 09/04/2021    LDLCALC 60 09/04/2021   TRIG 170 (H) 09/04/2021   CHOLHDL 3.1 09/04/2021    Lab Results  Component Value Date   HGBA1C 6.4 (A) 10/28/2021    Assessment & Plan   1. Hypertension: BP remains elevated above goal.  Will stop metoprolol and start carvedilol 12.5 mg twice daily.  If BP remains elevated, consider addition of amlodipine.  Otherwise, continue current antihypertensive regimen.   2. CAD: Coronary CT angiogram revealed coronary calcium score 204, 94th percentile for age, race, and sex-matched controls, minimal plaques in the left main, LAD, and left circumflex arteries, mild plaque in the RCA, mitral annular calcification, and aortic atherosclerosis. Stable with no anginal symptoms. No indication for ischemic evaluation. Continue aspirin, valsartan, carvedilol as above, hydrochlorothiazide, and Lipitor.  3. Chronic diastolic heart failure: Echo in 07/2021 showed EF 65 to 70%, hyperdynamic LV function, severe LVH, indeterminate diastolic parameters, degenerative mitral valve, mild mitral valve regurgitation, mild to moderate mitral stenosis, severe mitral annular calcification.  She notes dependent nonpitting bilateral lower extremity edema, otherwise, euvolemic  and well compensated on exam.  No indication for loop diuretic at this time.   4. Valvular heart disease: Recent echo as above. 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 TEE was not pursued.  She is asymptomatic.  Consider repeat echocardiogram in 1 year.   5. Reported paroxysmal atrial fibrillation/Sinus tachycardia: No documented evidence of atrial fibrillation.  She is not on anticoagulation.  At her last visit she noted rare intermittent palpitations, intermittent elevated resting HR. Metoprolol was increased.  Previously declined cardiac monitor.  Continue to monitor HR with transition from metoprolol to carvedilol.   6. Hyperlipidemia: LDL was 60 in June 2023.   Continue aspirin, Lipitor.   7. Type 2 diabetes/obesity:  A1c was 6.4 in 10/2021. Monitored and managed per PCP.  Encouraged ongoing lifestyle modifications with diet and exercise.  She may be a candidate for GLP-1 agonist.   8. Pre-op cardiac exam: She is pending colonoscopy and was told she may need cardiac clearance.  According to the Revised Cardiac Risk Index (RCRI), her Perioperative Risk of Major Cardiac Event is (%): 0.9. Her Functional Capacity in METs is: 6.45 according to the Duke Activity Status Index (DASI). Therefore, based on ACC/AHA guidelines, patient would be at acceptable risk for the planned procedure without further cardiovascular testing.    9. Disposition: Follow-up in 1 month.       Lenna Sciara, NP 11/28/2021, 8:32 AM

## 2021-11-28 ENCOUNTER — Ambulatory Visit: Payer: BC Managed Care – PPO | Attending: Nurse Practitioner | Admitting: Nurse Practitioner

## 2021-11-28 ENCOUNTER — Encounter: Payer: Self-pay | Admitting: Nurse Practitioner

## 2021-11-28 VITALS — BP 136/84 | HR 88 | Ht 64.0 in | Wt 255.4 lb

## 2021-11-28 DIAGNOSIS — I3481 Nonrheumatic mitral (valve) annulus calcification: Secondary | ICD-10-CM

## 2021-11-28 DIAGNOSIS — R000 Tachycardia, unspecified: Secondary | ICD-10-CM

## 2021-11-28 DIAGNOSIS — I2510 Atherosclerotic heart disease of native coronary artery without angina pectoris: Secondary | ICD-10-CM | POA: Diagnosis not present

## 2021-11-28 DIAGNOSIS — I340 Nonrheumatic mitral (valve) insufficiency: Secondary | ICD-10-CM

## 2021-11-28 DIAGNOSIS — E785 Hyperlipidemia, unspecified: Secondary | ICD-10-CM

## 2021-11-28 DIAGNOSIS — I342 Nonrheumatic mitral (valve) stenosis: Secondary | ICD-10-CM

## 2021-11-28 DIAGNOSIS — I10 Essential (primary) hypertension: Secondary | ICD-10-CM | POA: Diagnosis not present

## 2021-11-28 DIAGNOSIS — I5032 Chronic diastolic (congestive) heart failure: Secondary | ICD-10-CM

## 2021-11-28 DIAGNOSIS — Z6841 Body Mass Index (BMI) 40.0 and over, adult: Secondary | ICD-10-CM

## 2021-11-28 MED ORDER — CARVEDILOL 12.5 MG PO TABS: 12.5000 mg | ORAL_TABLET | Freq: Two times a day (BID) | ORAL | 3 refills | Status: DC

## 2021-11-28 NOTE — Patient Instructions (Addendum)
Medication Instructions:  Stop Metoprolol 25 mg & 50 mg as directed Start Carvedilol 12.5 mg twice daily   *If you need a refill on your cardiac medications before your next appointment, please call your pharmacy*   Lab Work: NONE ordered at this time of appointment   If you have labs (blood work) drawn today and your tests are completely normal, you will receive your results only by: [place] (if you have MyChart) OR A paper copy in the mail If you have any lab test that is abnormal or we need to change your treatment, we will call you to review the results.   Testing/Procedures: NONE ordered at this time of appointment     Follow-Up: At [facility], you and your health needs are our priority.  As part of our continuing mission to provide you with exceptional heart care, we have created designated Provider Care Teams.  These Care Teams include your primary Cardiologist (physician) and Advanced Practice Providers (APPs -  Physician Assistants and Nurse Practitioners) who all work together to provide you with the care you need, when you need it.  We recommend signing up for the patient portal called "MyChart".  Sign up information is provided on this After Visit Summary.  MyChart is used to connect with patients for Virtual Visits (Telemedicine).  Patients are able to view lab/test results, encounter notes, upcoming appointments, etc.  Non-urgent messages can be sent to your provider as well.   To learn more about what you can do with MyChart, go to [email or web address].    Your next appointment:   1 month(s)  The format for your next appointment:   In Person  Provider:   Diona Browner, NP        Other Instructions Monitor blood pressure. Goal 130/80 or less.   Important Information About Sugar

## 2021-11-29 ENCOUNTER — Telehealth: Payer: Self-pay | Admitting: Nurse Practitioner

## 2021-11-29 MED ORDER — CARVEDILOL 12.5 MG PO TABS: 6.2500 mg | ORAL_TABLET | Freq: Two times a day (BID) | ORAL | Status: DC

## 2021-11-29 NOTE — Telephone Encounter (Signed)
Spoke to patient --   when she woke up blood pressure 118/69 she states when she went on her walk today. Her legs felt wobbly for the first time.  This usually does not happen per patient. She was concerned since she started the Carvedilol. She was concerned since the weekend was coming up.  Patient  states she took 2 doses already.   Patient states she and her Husband gets up very early in the morning- she took Carvedilol 12.5 mg at 2 30 am ( She will take other dose at 2:30 pm) .  They went for their daily 2-3 mile walk at 3:30 am after the walk  blood pressure was 112/56.  She states she takes the other medication - Valsartan, Hydrochlorothiazide usually 5:30 am.    RN informed patient blood pressures were good numbers, continue monitor. Continue to take medication at current time.  It may take a few days for body to adjust.   Patient wanted Raquel Sarna to know , in case you wanted to change anything.

## 2021-11-29 NOTE — Telephone Encounter (Signed)
Called patient ,instruction given to patient   to take 6.25 mg  twice a day ( 1/2 tablet of 12.5 mg)   Patient verbalized understanding .  She will continue to monitor blood pressure.   Take a blood pressure once or twice a day only . She does not need to take more than that.

## 2021-11-29 NOTE — Telephone Encounter (Signed)
   Let's decrease carvedilol to 6.25 bid and see if she tolerates this better. Thanks. -EM

## 2021-11-29 NOTE — Telephone Encounter (Signed)
Pt c/o BP issue: STAT if pt c/o blurred vision, one-sided weakness or slurred speech  1. What are your last 5 BP readings? 118/69 this morning HR 75, 7:40am 112/56   2. Are you having any other symptoms (ex. Dizziness, headache, blurred vision, passed out)? Legs are wobbly when she walks  3. What is your BP issue? Patient states her medications were changed and her BP has been low. She says she was put on carvedilol and taken off metoprolol. She says she has only taken the carvedilol twice.

## 2021-12-05 ENCOUNTER — Encounter: Payer: BC Managed Care – PPO | Admitting: Gastroenterology

## 2021-12-09 ENCOUNTER — Other Ambulatory Visit: Payer: Self-pay | Admitting: Internal Medicine

## 2021-12-11 ENCOUNTER — Other Ambulatory Visit: Payer: Self-pay

## 2021-12-11 MED ORDER — ATORVASTATIN CALCIUM 40 MG PO TABS: 40.0000 mg | ORAL_TABLET | Freq: Every day | ORAL | 0 refills | Status: DC

## 2021-12-19 ENCOUNTER — Encounter: Payer: Self-pay | Admitting: Internal Medicine

## 2021-12-23 ENCOUNTER — Other Ambulatory Visit: Payer: Self-pay | Admitting: Nurse Practitioner

## 2021-12-25 ENCOUNTER — Ambulatory Visit (AMBULATORY_SURGERY_CENTER): Payer: Self-pay

## 2021-12-25 VITALS — Ht 64.0 in | Wt 259.0 lb

## 2021-12-25 DIAGNOSIS — Z1211 Encounter for screening for malignant neoplasm of colon: Secondary | ICD-10-CM

## 2021-12-25 MED ORDER — NA SULFATE-K SULFATE-MG SULF 17.5-3.13-1.6 GM/177ML PO SOLN: 1.0000 | Freq: Once | ORAL | 0 refills | Status: AC

## 2021-12-25 NOTE — Progress Notes (Signed)
No egg or soy allergy known to patient  No issues known to pt with past sedation with any surgeries or procedures Patient denies ever being told they had issues or difficulty with intubation  No FH of Malignant Hyperthermia Pt is not on diet pills Pt is not on home 02  Pt is not on blood thinners  Pt reports issues with constipation -- patient takes stool softener daily and has daily bowel movement No A fib or A flutter Have any cardiac testing pending--NO Pt instructed to use [email or web address] or GoodRx for a price reduction on prep    Per 11/28/2021 [facility] from [facility], NP=. Pre-op cardiac exam: She is pending colonoscopy and was told she may need cardiac clearance.  According to the /Revised Cardiac Risk Index (RCRI), her Perioperative Risk of Major Cardiac Event is (%): 0.9. Her Functional Capacity in METs is: 6.45 according to the Duke Activity Status Index (DASI). Therefore, based on ACC/AHA guidelines, patient would be at acceptable risk for the planned procedure without further cardiovascular testing.   Insurance verified during [facility] appt=[employer] [place]

## 2021-12-31 ENCOUNTER — Encounter: Payer: Self-pay | Admitting: Internal Medicine

## 2022-01-02 ENCOUNTER — Ambulatory Visit: Payer: BC Managed Care – PPO | Attending: Nurse Practitioner | Admitting: Nurse Practitioner

## 2022-01-02 ENCOUNTER — Encounter: Payer: Self-pay | Admitting: Nurse Practitioner

## 2022-01-02 ENCOUNTER — Other Ambulatory Visit: Payer: Self-pay

## 2022-01-02 VITALS — BP 138/70 | HR 94 | Ht 64.0 in | Wt 259.4 lb

## 2022-01-02 DIAGNOSIS — I2510 Atherosclerotic heart disease of native coronary artery without angina pectoris: Secondary | ICD-10-CM | POA: Diagnosis not present

## 2022-01-02 DIAGNOSIS — I340 Nonrheumatic mitral (valve) insufficiency: Secondary | ICD-10-CM

## 2022-01-02 DIAGNOSIS — E1169 Type 2 diabetes mellitus with other specified complication: Secondary | ICD-10-CM

## 2022-01-02 DIAGNOSIS — I480 Paroxysmal atrial fibrillation: Secondary | ICD-10-CM

## 2022-01-02 DIAGNOSIS — R000 Tachycardia, unspecified: Secondary | ICD-10-CM

## 2022-01-02 DIAGNOSIS — I5032 Chronic diastolic (congestive) heart failure: Secondary | ICD-10-CM

## 2022-01-02 DIAGNOSIS — E785 Hyperlipidemia, unspecified: Secondary | ICD-10-CM

## 2022-01-02 DIAGNOSIS — I342 Nonrheumatic mitral (valve) stenosis: Secondary | ICD-10-CM

## 2022-01-02 DIAGNOSIS — I10 Essential (primary) hypertension: Secondary | ICD-10-CM

## 2022-01-02 DIAGNOSIS — I3481 Nonrheumatic mitral (valve) annulus calcification: Secondary | ICD-10-CM

## 2022-01-02 DIAGNOSIS — Z6841 Body Mass Index (BMI) 40.0 and over, adult: Secondary | ICD-10-CM

## 2022-01-02 MED ORDER — AMLODIPINE BESYLATE 5 MG PO TABS: 5.0000 mg | ORAL_TABLET | Freq: Every day | ORAL | 3 refills | Status: DC

## 2022-01-02 MED ORDER — FUROSEMIDE 20 MG PO TABS: 20.0000 mg | ORAL_TABLET | Freq: Every day | ORAL | 3 refills | Status: DC

## 2022-01-02 NOTE — Patient Instructions (Signed)
Medication Instructions:  Start Amlodipine 5 mg daily Start Lasix 20 mg daily   *If you need a refill on your cardiac medications before your next appointment, please call your pharmacy*   Lab Work: Your physician recommends that you return for lab work in 2 weeks BMET  If you have labs (blood work) drawn today and your tests are completely normal, you will receive your results only by: [employer] (if you have MyChart) OR A paper copy in the mail If you have any lab test that is abnormal or we need to change your treatment, we will call you to review the results.   Testing/Procedures: NONE ordered at this time of appointment     Follow-Up: At [facility], you and your health needs are our priority.  As part of our continuing mission to provide you with exceptional heart care, we have created designated Provider Care Teams.  These Care Teams include your primary Cardiologist (physician) and Advanced Practice Providers (APPs -  Physician Assistants and Nurse Practitioners) who all work together to provide you with the care you need, when you need it.  We recommend signing up for the patient portal called "MyChart".  Sign up information is provided on this After Visit Summary.  MyChart is used to connect with patients for Virtual Visits (Telemedicine).  Patients are able to view lab/test results, encounter notes, upcoming appointments, etc.  Non-urgent messages can be sent to your provider as well.   To learn more about what you can do with MyChart, go to [email or web address].    Your next appointment:   1 month(s)  The format for your next appointment:   In Person  Provider:   Elouise Munroe, MD     Other Instructions   Important Information About Sugar

## 2022-01-02 NOTE — Progress Notes (Signed)
Office Visit    Patient Name: Karen Miller Date of Encounter: 01/02/2022  Primary Care Provider:  Michela Pitcher, NP Primary Cardiologist:  Elouise Munroe, MD  Chief Complaint    61 year old female with a history of CAD, chronic diastolic heart failure, hypertension, hyperlipidemia, reported paroxysmal atrial fibrillation (no documented history), PVCs, type 2 diabetes, and obesity who presents for follow-up related to hypertension and heart failure.  Past Medical History    Past Medical History:  Diagnosis Date   Diabetes mellitus without complication ([place])    on meds   Hyperlipidemia    on meds   Hypertension    on meds   Past Surgical History:  Procedure Laterality Date   FACIAL FRACTURE SURGERY  2008   right side of the face completely plated-crushed her facial bones on that side   OTHER SURGICAL HISTORY  2008   right arm fractured in multiple areas-from right elbow down to right wrist plated and screws   UMBILICAL HERNIA REPAIR     has mesh   WISDOM TOOTH EXTRACTION      Allergies  No Known Allergies  History of Present Illness    61 year old female with the above past medical history including CAD, chronic diastolic heart failure, hypertension, hyperlipidemia, reported paroxysmal atrial fibrillation (no documented history), PVCs, type 2 diabetes, and obesity.   She hospitalized in May 2023 in the setting of hypertensive urgency, acute diastolic heart failure. She valuated at urgent care prior to her hospitalization and was told that she "might be in atrial fibrillation."  EKG from urgent care visit was unavailable for review, therefore, no documented history of atrial fibrillation.  EKG the ED showed sinus tachycardia, no evidence of ischemia, or arrhythmia. Troponin was elevated.  CT of the chest was negative for PE. Cardiology was consulted. Echocardiogram showed EF 65 to 70%, hyperdynamic LV function, severe LVH, indeterminate diastolic parameters,  degenerative mitral valve, mild mitral valve regurgitation, mild to moderate mitral stenosis, severe mitral annular calcification. Coronary CT angiogram revealed coronary calcium score 204, 94th percentile for age, race, and sex-matched controls, minimal plaques in the left main, LAD, and left circumflex arteries, mild plaque in the RCA, mitral annular calcification, and aortic atherosclerosis.  She was given furosemide and started on losartan and HCTZ.  Given presence of MAC, blood cultures were obtained however, these were negative. Therefore, TEE was not pursued given low suspicion for endocarditis with negative blood cultures.    She was last seen in the office on 11/28/2021 and was stable from a cardiac standpoint. Her BP was still elevated above goal.  Metoprolol was switched to carvedilol 12.5 mg twice daily.  However, this was later decreased to 6.25 mg twice daily due to borderline hypotension.  She noticed an increase in her BP with this change so she once again increased her dose to carvedilol 12.5 mg twice daily.  She presents today for follow-up. Since her last visit she has been stable from a cardiac standpoint. She continues to note elevated BP at home with SBP consistently ranging from the 130s-150s. She continues to note sensitivity to light only when shopping at certain stores such as [employer] and [employer].  She discussed this with both her PCP and her eye doctor and the cause of this remains unclear.  She has noted a slight increase in her nonpitting bilateral lower extremity edema. She denies dyspnea, pnd, orthopnea, weight gain.  Other than her ongoing elevated BP, light sensitivity, and lower extremity edema,  she reports feeling well and denies any additional concerns today.  Home Medications    Current Outpatient Medications  Medication Sig Dispense Refill   amLODipine (NORVASC) 5 MG tablet Take 1 tablet (5 mg total) by mouth daily. 90 tablet 3   atorvastatin (LIPITOR) 40 MG tablet  Take 1 tablet (40 mg total) by mouth daily. 30 tablet 0   carvedilol (COREG) 12.5 MG tablet Take 0.5 tablets (6.25 mg total) by mouth 2 (two) times daily. (Patient taking differently: Take 12.5 mg by mouth 2 (two) times daily.)     docusate sodium (COLACE) 100 MG capsule Take 100 mg by mouth daily.     furosemide (LASIX) 20 MG tablet Take 1 tablet (20 mg total) by mouth daily. 90 tablet 3   hydrochlorothiazide (HYDRODIURIL) 25 MG tablet TAKE 1 TABLET DAILY 90 tablet 1   metFORMIN (GLUCOPHAGE) 500 MG tablet TAKE 1 TABLET DAILY WITH BREAKFAST 90 tablet 0   QC LO-DOSE ASPIRIN 81 MG tablet TAKE 1 TABLET DAILY - SWALLOW WHOLE 90 tablet 1   valsartan (DIOVAN) 320 MG tablet Take 1 tablet (320 mg total) by mouth daily. 90 tablet 3   Vitamin D, Ergocalciferol, (DRISDOL) 1.25 MG (50000 UNIT) CAPS capsule Take 1 capsule (50,000 Units total) by mouth every 7 (seven) days. 12 capsule 0   No current facility-administered medications for this visit.     Review of Systems    She denies chest pain, palpitations, dyspnea, pnd, orthopnea, n, v, dizziness, syncope, edema, weight gain, or early satiety. All other systems reviewed and are otherwise negative except as noted above.  Physical Exam    VS:  BP 138/70   Pulse 94   Ht _0  (1.626 m)   Wt 259 lb 6.4 oz (117.7 kg)   SpO2 98%   BMI 44.53 kg/m  GEN: Well nourished, well developed, in no acute distress. HEENT: normal. Neck: Supple, no JVD, carotid bruits, or masses. Cardiac: RRR, no murmurs, rubs, or gallops. No clubbing, cyanosis, edema.  Radials/DP/PT 2+ and equal bilaterally.  Respiratory:  Respirations regular and unlabored, clear to auscultation bilaterally. GI: Soft, nontender, nondistended, BS + x 4. MS: no deformity or atrophy. Skin: warm and dry, no rash. Neuro:  Strength and sensation are intact. Psych: Normal affect.  Accessory Clinical Findings    ECG personally reviewed by me today - No EKG in office today.   Lab Results   Component Value Date   WBC 8.6 10/28/2021   HGB 12.7 10/28/2021   HCT 37.9 10/28/2021   MCV 93.6 10/28/2021   PLT 311.0 10/28/2021   Lab Results  Component Value Date   CREATININE 0.80 10/28/2021   BUN 30 (H) 10/28/2021   NA 137 10/28/2021   K 4.0 10/28/2021   CL 97 10/28/2021   CO2 27 10/28/2021   Lab Results  Component Value Date   ALT 22 09/04/2021   AST 21 09/04/2021   ALKPHOS 83 09/04/2021   BILITOT 0.5 09/04/2021   Lab Results  Component Value Date   CHOL 131 09/04/2021   HDL 42 09/04/2021   LDLCALC 60 09/04/2021   TRIG 170 (H) 09/04/2021   CHOLHDL 3.1 09/04/2021    Lab Results  Component Value Date   HGBA1C 6.4 (A) 10/28/2021    Assessment & Plan    1. Hypertension: BP remains elevated above goal.  We will add amlodipine 5 mg daily.  We will start Lasix 20 mg daily as below.  Continue to monitor BP and report  BP consistently above 130/80. Plan for close follow-up.  Otherwise, continue current antihypertensive regimen.  2. CAD: Coronary CT angiogram revealed coronary calcium score 204, 94th percentile for age, race, and sex-matched controls, minimal plaques in the left main, LAD, and left circumflex arteries, mild plaque in the RCA, mitral annular calcification, and aortic atherosclerosis. Stable with no anginal symptoms. No indication for ischemic evaluation. Continue aspirin, valsartan,  carvedilol as above, hydrochlorothiazide, and Lipitor.  3. Chronic diastolic heart failure: Echo in 07/2021 showed EF 65 to 70%, hyperdynamic LV function, severe LVH, indeterminate diastolic parameters, degenerative mitral valve, mild mitral valve regurgitation, mild to moderate mitral stenosis, severe mitral annular calcification.  She notes dependent nonpitting bilateral lower extremity edema, this has worsened slightly otherwise, euvolemic and well compensated on exam.   Will start Lasix 20 mg daily.  We will check BMET in 2 weeks.   4. Valvular heart disease: Recent echo as  above. 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 TEE was not pursued.  She is asymptomatic.  Consider repeat echocardiogram in 1 year.   5. Reported paroxysmal atrial fibrillation/Sinus tachycardia: No documented evidence of atrial fibrillation.  She is not on anticoagulation. Previously declined cardiac monitor.  HR stable.  Denies any recent palpitations.   6. Hyperlipidemia: LDL was 60 in June 2023.  Continue aspirin, Lipitor.   7. Type 2 diabetes/obesity:  A1c was 6.4 in 10/2021. Monitored and managed per PCP.  Encouraged ongoing lifestyle modifications with diet and exercise.  She may be a candidate for GLP-1 agonist.   8. Disposition: Follow-up in 1 month.      Lenna Sciara, NP 01/02/2022, 12:16 PM

## 2022-01-07 ENCOUNTER — Telehealth: Payer: Self-pay | Admitting: Internal Medicine

## 2022-01-07 MED ORDER — ATORVASTATIN CALCIUM 40 MG PO TABS: 40.0000 mg | ORAL_TABLET | Freq: Every day | ORAL | 6 refills | Status: DC

## 2022-01-07 NOTE — Telephone Encounter (Signed)
*  STAT* If patient is at the pharmacy, call can be transferred to refill team.   1. Which medications need to be refilled? (please list name of each medication and dose if known)  atorvastatin (LIPITOR) 40 MG tablet  2. Which pharmacy/location (including street and city if local pharmacy) is medication to be sent to? [facility], [place] - [address]  3. Do they need a 30 day or 90 day supply?  90 day supply

## 2022-01-09 ENCOUNTER — Encounter: Payer: Self-pay | Admitting: Internal Medicine

## 2022-01-09 ENCOUNTER — Ambulatory Visit (AMBULATORY_SURGERY_CENTER): Payer: BC Managed Care – PPO | Admitting: Internal Medicine

## 2022-01-09 VITALS — BP 100/64 | HR 86 | Temp 98.2°F | Resp 19 | Ht 64.0 in | Wt 259.0 lb

## 2022-01-09 DIAGNOSIS — D123 Benign neoplasm of transverse colon: Secondary | ICD-10-CM | POA: Diagnosis not present

## 2022-01-09 DIAGNOSIS — Z1211 Encounter for screening for malignant neoplasm of colon: Secondary | ICD-10-CM | POA: Diagnosis present

## 2022-01-09 DIAGNOSIS — D122 Benign neoplasm of ascending colon: Secondary | ICD-10-CM | POA: Diagnosis not present

## 2022-01-09 MED ORDER — HYDROCORTISONE (PERIANAL) 2.5 % EX CREA: 1.0000 | TOPICAL_CREAM | Freq: Two times a day (BID) | CUTANEOUS | 1 refills | Status: DC

## 2022-01-09 MED ORDER — SODIUM CHLORIDE 0.9 % IV SOLN: 500.0000 mL | Freq: Once | INTRAVENOUS | Status: DC

## 2022-01-09 NOTE — Patient Instructions (Signed)
Resume previous medications. 5 polyps removed and sent to pathology.  Await results for final recommendations.    Handouts on findings given to patient.  (Polyps, hemorrhoids and diverticulosis)  Anusol sent to pharmacy.  Use twice a day per rectum for 7 days.   YOU HAD AN ENDOSCOPIC PROCEDURE TODAY AT [facility]:   Refer to the procedure report that was given to you for any specific questions about what was found during the examination.  If the procedure report does not answer your questions, please call your gastroenterologist to clarify.  If you requested that your care partner not be given the details of your procedure findings, then the procedure report has been included in a sealed envelope for you to review at your convenience later.  YOU SHOULD EXPECT: Some feelings of bloating in the abdomen. Passage of more gas than usual.  Walking can help get rid of the air that was put into your GI tract during the procedure and reduce the bloating. If you had a lower endoscopy (such as a colonoscopy or flexible sigmoidoscopy) you may notice spotting of blood in your stool or on the toilet paper. If you underwent a bowel prep for your procedure, you may not have a normal bowel movement for a few days.  Please Note:  You might notice some irritation and congestion in your nose or some drainage.  This is from the oxygen used during your procedure.  There is no need for concern and it should clear up in a day or so.  SYMPTOMS TO REPORT IMMEDIATELY:  Following lower endoscopy (colonoscopy or flexible sigmoidoscopy):  Excessive amounts of blood in the stool  Significant tenderness or worsening of abdominal pains  Swelling of the abdomen that is new, acute  Fever of 100F or higher   For urgent or emergent issues, a gastroenterologist can be reached at any hour by calling [phone]. Do not use MyChart messaging for urgent concerns.    DIET:  We do recommend a small meal at  first, but then you may proceed to your regular diet.  Drink plenty of fluids but you should avoid alcoholic beverages for 24 hours.  ACTIVITY:  You should plan to take it easy for the rest of today and you should NOT DRIVE or use heavy machinery until tomorrow (because of the sedation medicines used during the test).    FOLLOW UP: Our staff will call the number listed on your records the next business day following your procedure.  We will call around 7:15- 8:00 am to check on you and address any questions or concerns that you may have regarding the information given to you following your procedure. If we do not reach you, we will leave a message.     If any biopsies were taken you will be contacted by phone or by letter within the next 1-3 weeks.  Please call us at [phone] if you have not heard about the biopsies in 3 weeks.    SIGNATURES/CONFIDENTIALITY: You and/or your care partner have signed paperwork which will be entered into your electronic medical record.  These signatures attest to the fact that that the information above on your After Visit Summary has been reviewed and is understood.  Full responsibility of the confidentiality of this discharge information lies with you and/or your care-partner.

## 2022-01-09 NOTE — Progress Notes (Signed)
Report to pacu RN. VSS. Care resumed by RN.

## 2022-01-09 NOTE — Progress Notes (Signed)
Vitals-CW  Pt's states no medical or surgical changes since previsit or office visit. 

## 2022-01-09 NOTE — Op Note (Signed)
[facility] Patient Name: Allannah Kempen Procedure Date: 01/09/2022 8:56 AM MRN: [id number] Endoscopist: Adline Mango [place] , , [id number] Age: 61 Referring MD:  Date of Birth: Jul 04, 1960 Gender: Female Account #: [id number] Procedure:                Colonoscopy Indications:              Screening for colorectal malignant neoplasm, This                            is the patient's first colonoscopy Medicines:                Monitored Anesthesia Care Procedure:                Pre-Anesthesia Assessment:                           - Prior to the procedure, a History and Physical                            was performed, and patient medications and                            allergies were reviewed. The patient's tolerance of                            previous anesthesia was also reviewed. The risks                            and benefits of the procedure and the sedation                            options and risks were discussed with the patient.                            All questions were answered, and informed consent                            was obtained. Prior Anticoagulants: The patient has                            taken no anticoagulant or antiplatelet agents. ASA                            Grade Assessment: II - A patient with mild systemic                            disease. After reviewing the risks and benefits,                            the patient was deemed in satisfactory condition to                            undergo the procedure.  After obtaining informed consent, the colonoscope                            was passed under direct vision. Throughout the                            procedure, the patient's blood pressure, pulse, and                            oxygen saturations were monitored continuously. The                            CF [id number] #[id number] was introduced through the anus                            and advanced to  the the cecum, identified by                            appendiceal orifice and ileocecal valve. The                            colonoscopy was performed without difficulty. The                            patient tolerated the procedure well. The quality                            of the bowel preparation was adequate. The                            ileocecal valve, appendiceal orifice, and rectum                            were photographed. Scope In: 9:00:51 AM Scope Out: 9:27:10 AM Scope Withdrawal Time: 0 hours 17 minutes 19 seconds  Total Procedure Duration: 0 hours 26 minutes 19 seconds  Findings:                 Four sessile polyps were found in the transverse                            colon and ascending colon. The polyps were 3 to 6                            mm in size. These polyps were removed with a cold                            snare. Resection and retrieval were complete.                           A 2 mm polyp was found in the transverse colon. The                            polyp was  sessile. The polyp was removed with a                            cold biopsy forceps. Resection and retrieval were                            complete.                           Multiple diverticula were found in the sigmoid                            colon, descending colon and transverse colon.                           Non-bleeding internal hemorrhoids were found during                            retroflexion. Complications:            No immediate complications. Estimated Blood Loss:     Estimated blood loss was minimal. Impression:               - Four 3 to 6 mm polyps in the transverse colon and                            in the ascending colon, removed with a cold snare.                            Resected and retrieved.                           - One 2 mm polyp in the transverse colon, removed                            with a cold biopsy forceps. Resected and retrieved.                            - Diverticulosis in the sigmoid colon, in the                            descending colon and in the transverse colon.                           - Non-bleeding internal hemorrhoids. Recommendation:           - Discharge patient to home (with escort).                           - Await pathology results.                           - The findings and recommendations were discussed                            with the patient. Dr Georgian Co "  Christia Reading,  01/09/2022 9:32:18 AM

## 2022-01-09 NOTE — Progress Notes (Signed)
GASTROENTEROLOGY PROCEDURE H&P NOTE   Primary Care Physician: Michela Pitcher, NP    Reason for Procedure:   Colon cancer screening  Plan:    Colonoscopy  Patient is appropriate for endoscopic procedure(s) in the ambulatory ([facility]) setting.  The nature of the procedure, as well as the risks, benefits, and alternatives were carefully and thoroughly reviewed with the patient. Ample time for discussion and questions allowed. The patient understood, was satisfied, and agreed to proceed.     HPI: Karen Miller is a 61 y.o. female who presents for colonoscopy. Denies blood in stools, changes in bowel habits, weight loss. Denies family history of colon cancer.   Past Medical History:  Diagnosis Date   Diabetes mellitus without complication ([place])    on meds   Hyperlipidemia    on meds   Hypertension    on meds    Past Surgical History:  Procedure Laterality Date   FACIAL FRACTURE SURGERY  2008   right side of the face completely plated-crushed her facial bones on that side   OTHER SURGICAL HISTORY  2008   right arm fractured in multiple areas-from right elbow down to right wrist plated and screws   UMBILICAL HERNIA REPAIR     has mesh   WISDOM TOOTH EXTRACTION      Prior to Admission medications   Medication Sig Start Date End Date Taking? Authorizing Provider  amLODipine (NORVASC) 5 MG tablet Take 1 tablet (5 mg total) by mouth daily. 01/02/22  Yes Monge, Helane Gunther, NP  atorvastatin (LIPITOR) 40 MG tablet Take 1 tablet (40 mg total) by mouth daily. 01/07/22  Yes Elouise Munroe, MD  carvedilol (COREG) 12.5 MG tablet Take 0.5 tablets (6.25 mg total) by mouth 2 (two) times daily. Patient taking differently: Take 12.5 mg by mouth 2 (two) times daily. 11/29/21  Yes Monge, Helane Gunther, NP  furosemide (LASIX) 20 MG tablet Take 1 tablet (20 mg total) by mouth daily. 01/02/22  Yes Monge, Helane Gunther, NP  hydrochlorothiazide (HYDRODIURIL) 25 MG tablet TAKE 1 TABLET DAILY 10/08/21  Yes  Michela Pitcher, NP  metFORMIN (GLUCOPHAGE) 500 MG tablet TAKE 1 TABLET DAILY WITH BREAKFAST 12/24/21  Yes Michela Pitcher, NP  QC LO-DOSE ASPIRIN 81 MG tablet TAKE 1 TABLET DAILY - SWALLOW WHOLE 10/08/21  Yes Michela Pitcher, NP  valsartan (DIOVAN) 320 MG tablet Take 1 tablet (320 mg total) by mouth daily. 09/26/21  Yes Monge, Helane Gunther, NP  Vitamin D, Ergocalciferol, (DRISDOL) 1.25 MG (50000 UNIT) CAPS capsule Take 1 capsule (50,000 Units total) by mouth every 7 (seven) days. 10/29/21  Yes Michela Pitcher, NP    Current Outpatient Medications  Medication Sig Dispense Refill   amLODipine (NORVASC) 5 MG tablet Take 1 tablet (5 mg total) by mouth daily. 90 tablet 3   atorvastatin (LIPITOR) 40 MG tablet Take 1 tablet (40 mg total) by mouth daily. 30 tablet 6   carvedilol (COREG) 12.5 MG tablet Take 0.5 tablets (6.25 mg total) by mouth 2 (two) times daily. (Patient taking differently: Take 12.5 mg by mouth 2 (two) times daily.)     furosemide (LASIX) 20 MG tablet Take 1 tablet (20 mg total) by mouth daily. 90 tablet 3   hydrochlorothiazide (HYDRODIURIL) 25 MG tablet TAKE 1 TABLET DAILY 90 tablet 1   metFORMIN (GLUCOPHAGE) 500 MG tablet TAKE 1 TABLET DAILY WITH BREAKFAST 90 tablet 0   QC LO-DOSE ASPIRIN 81 MG tablet TAKE 1 TABLET DAILY - SWALLOW  WHOLE 90 tablet 1   valsartan (DIOVAN) 320 MG tablet Take 1 tablet (320 mg total) by mouth daily. 90 tablet 3   Vitamin D, Ergocalciferol, (DRISDOL) 1.25 MG (50000 UNIT) CAPS capsule Take 1 capsule (50,000 Units total) by mouth every 7 (seven) days. 12 capsule 0   Current Facility-Administered Medications  Medication Dose Route Frequency Provider Last Rate Last Admin   0.9 %  sodium chloride infusion  500 mL Intravenous Once Sharyn Creamer, MD        Allergies as of 01/09/2022   (No Known Allergies)    Family History  Problem Relation Age of Onset   Diabetes Mother    Diabetes Sister    Thyroid disease Sister    Breast cancer Sister 39   Thyroid disease  Sister    Hypertension Sister    Heart attack Sister        x 2-stents   Diabetes Brother    Other Brother        enlarged heart and has defibrillator   Colon polyps Neg Hx    Colon cancer Neg Hx    Esophageal cancer Neg Hx    Rectal cancer Neg Hx    Stomach cancer Neg Hx     Social History   Socioeconomic History   Marital status: Married    Spouse name: Not on file   Number of children: 2   Years of education: Not on file   Highest education level: Not on file  Occupational History   Not on file  Tobacco Use   Smoking status: Never   Smokeless tobacco: Never  Vaping Use   Vaping Use: Never used  Substance and Sexual Activity   Alcohol use: Never   Drug use: Never   Sexual activity: Not on file  Other Topics Concern   Not on file  Social History Narrative   Retired: worked for the state for 39      Consider rotating when you check your blood glucose (when you wake, 2 hours after breakfast or 2 hours after dinner)   Blood glucose goals:        Fasting:  80-130        2 hours after the start of a meal:  Less than 180        If your post meal reading is higher than 180, identify what you ate or other things that could have contributed              Social Determinants of Health   Financial Resource Strain: Not on file  Food Insecurity: Not on file  Transportation Needs: Not on file  Physical Activity: Not on file  Stress: Not on file  Social Connections: Not on file  Intimate Partner Violence: Not on file    Physical Exam: Vital signs in last 24 hours: BP (!) 177/79 (BP Location: Right Arm, Patient Position: Sitting, Cuff Size: Normal)   Pulse 85   Temp 98.2 F (36.8 C) (Temporal)   Ht '5\' 4"'$  (1.626 m)   Wt 259 lb (117.5 kg)   SpO2 99%   BMI 44.46 kg/m  GEN: NAD EYE: Sclerae anicteric ENT: MMM CV: Non-tachycardic Pulm: No increased work of breathing GI: Soft, NT/ND NEURO:  Alert & Oriented   Christia Reading, MD [facility]  01/09/2022 8:46 AM

## 2022-01-10 ENCOUNTER — Telehealth: Payer: Self-pay

## 2022-01-10 NOTE — Telephone Encounter (Signed)
  Follow up Call-     01/09/2022    7:50 AM 01/09/2022    7:45 AM  Call back number  Post procedure Call Back phone  # [phone]   Permission to leave phone message  Yes     Patient questions:  Do you have a fever, pain , or abdominal swelling? No. Pain Score  0 *  Have you tolerated food without any problems? Yes.    Have you been able to return to your normal activities? Yes.    Do you have any questions about your discharge instructions: Diet   No. Medications  No. Follow up visit  No.  Do you have questions or concerns about your Care? No.  Actions: * If pain score is 4 or above: No action needed, pain <4.

## 2022-01-14 ENCOUNTER — Encounter: Payer: Self-pay | Admitting: Internal Medicine

## 2022-01-16 LAB — BASIC METABOLIC PANEL
BUN/Creatinine Ratio: 30 — ABNORMAL HIGH (ref 12–28)
BUN: 25 mg/dL (ref 8–27)
CO2: 27 mmol/L (ref 20–29)
Calcium: 10.1 mg/dL (ref 8.7–10.3)
Chloride: 97 mmol/L (ref 96–106)
Creatinine, Ser: 0.83 mg/dL (ref 0.57–1.00)
Glucose: 154 mg/dL — ABNORMAL HIGH (ref 70–99)
Potassium: 4.6 mmol/L (ref 3.5–5.2)
Sodium: 139 mmol/L (ref 134–144)
eGFR: 80 mL/min/{1.73_m2} (ref >59)

## 2022-01-29 ENCOUNTER — Telehealth: Payer: Self-pay | Admitting: Nurse Practitioner

## 2022-01-29 ENCOUNTER — Ambulatory Visit (INDEPENDENT_AMBULATORY_CARE_PROVIDER_SITE_OTHER): Payer: BC Managed Care – PPO | Admitting: Nurse Practitioner

## 2022-01-29 VITALS — BP 150/84 | HR 73 | Temp 98.2°F | Resp 12 | Ht 64.0 in | Wt 259.4 lb

## 2022-01-29 DIAGNOSIS — E1169 Type 2 diabetes mellitus with other specified complication: Secondary | ICD-10-CM

## 2022-01-29 DIAGNOSIS — E1159 Type 2 diabetes mellitus with other circulatory complications: Secondary | ICD-10-CM

## 2022-01-29 DIAGNOSIS — E559 Vitamin D deficiency, unspecified: Secondary | ICD-10-CM | POA: Diagnosis not present

## 2022-01-29 DIAGNOSIS — E785 Hyperlipidemia, unspecified: Secondary | ICD-10-CM

## 2022-01-29 DIAGNOSIS — H538 Other visual disturbances: Secondary | ICD-10-CM

## 2022-01-29 DIAGNOSIS — I152 Hypertension secondary to endocrine disorders: Secondary | ICD-10-CM

## 2022-01-29 LAB — POCT GLYCOSYLATED HEMOGLOBIN (HGB A1C): Hemoglobin A1C: 6.7 % — AB (ref 4.0–5.6)

## 2022-01-29 MED ORDER — ASPIRIN 81 MG PO TBEC: DELAYED_RELEASE_TABLET | ORAL | 1 refills | Status: AC

## 2022-01-29 MED ORDER — VITAMIN D (ERGOCALCIFEROL) 1.25 MG (50000 UNIT) PO CAPS: 50000.0000 [IU] | ORAL_CAPSULE | ORAL | 0 refills | Status: DC

## 2022-01-29 MED ORDER — TRULICITY 0.75 MG/0.5ML ~~LOC~~ SOAJ: 0.7500 mg | SUBCUTANEOUS | 0 refills | Status: DC

## 2022-01-29 MED ORDER — METFORMIN HCL 500 MG PO TABS: 500.0000 mg | ORAL_TABLET | Freq: Every day | ORAL | 3 refills | Status: DC

## 2022-01-29 MED ORDER — HYDROCHLOROTHIAZIDE 25 MG PO TABS: 25.0000 mg | ORAL_TABLET | Freq: Every day | ORAL | 0 refills | Status: DC

## 2022-01-29 NOTE — Progress Notes (Signed)
Established Patient Office Visit  Subjective   Patient ID: Karen Miller, female    DOB: [birth year]  Age: 61 y.o. MRN: [id number]  Chief Complaint  Patient presents with   Diabetes    Follow up    HPI  DM2: Dx during a hospital stay. She is currenlty on metformin Averaging 116-126 in the morning. States that the highest one in the morning was 146.   HTN: Patient is being folloed by Cardiology. Currently on lasix, amlodipine, HCTZ, valsartan. States this morning was 133/65. States Friday 123/67  Obesity: States that the she has not been walking as much over the last 3 weeks due to the colder weather  States that she does get her legs will get shaky and her vision was blurred approximately 30 minutes or so after she started shopping.  States she will go to the car and rest that will abate within a few minutes.  Patient has not checked her sugars when this happened.  She has mentioned this to her cardiology provider.  Encourage patient to take her glucometer with her and when this happens to check her sugar and see if its within normal limits.    Review of Systems  Constitutional:  Negative for chills and fever.  Respiratory:  Negative for shortness of breath.   Cardiovascular:  Negative for chest pain.  Gastrointestinal:  Positive for constipation. Negative for abdominal pain, diarrhea, nausea and vomiting.      Objective:     BP (!) 150/84   Pulse 73   Temp 98.2 F (36.8 C)   Resp 12   Ht '5\' 4"'$  (1.626 m)   Wt 259 lb 6 oz (117.7 kg)   SpO2 98%   BMI 44.52 kg/m  BP Readings from Last 3 Encounters:  01/29/22 (!) 150/84  01/09/22 100/64  01/02/22 138/70   Wt Readings from Last 3 Encounters:  01/29/22 259 lb 6 oz (117.7 kg)  01/09/22 259 lb (117.5 kg)  01/02/22 259 lb 6.4 oz (117.7 kg)      Physical Exam Vitals and nursing note reviewed.  Constitutional:      Appearance: Normal appearance. She is obese.  Cardiovascular:     Rate and Rhythm: Normal rate and  regular rhythm.     Heart sounds: Normal heart sounds.  Pulmonary:     Breath sounds: Normal breath sounds.  Abdominal:     General: Bowel sounds are normal.  Feet:     Right foot:     Skin integrity: Skin integrity normal.     Left foot:     Skin integrity: Skin breakdown present.     Comments: Skin breakdown on left foot between 4&5th toe Neurological:     Mental Status: She is alert.      Results for orders placed or performed in visit on 01/29/22  POCT glycosylated hemoglobin (Hb A1C)  Result Value Ref Range   Hemoglobin A1C 6.7 (A) 4.0 - 5.6 %   HbA1c POC (<> result, manual entry)     HbA1c, POC (prediabetic range)     HbA1c, POC (controlled diabetic range)        The 10-year ASCVD risk score (Arnett DK, et al., 2019) is: 11%    Assessment & Plan:   Problem List Items Addressed This Visit       Cardiovascular and Mediastinum   Hypertension associated with diabetes [facility])    Patient currently managed by cardiology.  Currently maintained on amlodipine, Lasix, HCTZ, valsartan and carvedilol.  Blood pressures have been doing well at home slightly elevated today blood pressure was within normal limits last cardiology appointment.  Did review last BMP performed by cardiology clinic within normal limits.      Relevant Medications   aspirin EC (QC LO-DOSE ASPIRIN) 81 MG tablet   hydrochlorothiazide (HYDRODIURIL) 25 MG tablet   metFORMIN (GLUCOPHAGE) 500 MG tablet   Dulaglutide (TRULICITY) 0.71 QR/9.7JO SOPN     Endocrine   Type 2 diabetes mellitus with hyperlipidemia ([place]) - Primary    Patient currently maintained on metformin 500 mg daily.  Tolerates medication well.  Her exercise has decreased due to the weather changes.  A1c 6.7% we will start patient on Trulicity 8.32 mg once weekly injection if she tolerates well for 1 month titrate up to 1.5 mg weekly.  Patient denies personal or family history of medullary thyroid cancer, multiple endocrine neoplasia syndrome  type II, or pancreatitis.      Relevant Medications   aspirin EC (QC LO-DOSE ASPIRIN) 81 MG tablet   hydrochlorothiazide (HYDRODIURIL) 25 MG tablet   metFORMIN (GLUCOPHAGE) 500 MG tablet   Dulaglutide (TRULICITY) 5.49 IY/6.4BR SOPN   Other Relevant Orders   POCT glycosylated hemoglobin (Hb A1C) (Completed)     Other   Obesity, Class III, BMI 40-49.9 (morbid obesity) ([place])    She is to continue working on lifestyle modifications.  Patient's weight has been stable.  Will place patient on a GLP-1 receptor agonist that should help facilitate some weight loss.      Relevant Medications   metFORMIN (GLUCOPHAGE) 500 MG tablet   Dulaglutide (TRULICITY) 8.30 NM/0.7WK SOPN   Vitamin D deficiency    Patient had a low vitamin D level of 11 she is completed 3 months worth of vitamin D will continue for another 3 months then recheck at next office visit.      Relevant Medications   Vitamin D, Ergocalciferol, (DRISDOL) 1.25 MG (50000 UNIT) CAPS capsule   Blurring of vision    Ambiguous in nature.  She has mentioned this to cardiology and me now.  Did recommend patient taking her glucometer with her when she goes shopping if this happens to check her glucose to make sure she is not running low.  Patient will update in between       Return in about 3 months (around 05/01/2022) for DM recheck .    Romilda Garret, NP

## 2022-01-29 NOTE — Assessment & Plan Note (Signed)
Ambiguous in nature.  She has mentioned this to cardiology and me now.  Did recommend patient taking her glucometer with her when she goes shopping if this happens to check her glucose to make sure she is not running low.  Patient will update in between

## 2022-01-29 NOTE — Assessment & Plan Note (Addendum)
Patient currently maintained on metformin 500 mg daily.  Tolerates medication well.  Her exercise has decreased due to the weather changes.  A1c 6.7% we will start patient on Trulicity 8.30 mg once weekly injection if she tolerates well for 1 month titrate up to 1.5 mg weekly.  Patient denies personal or family history of medullary thyroid cancer, multiple endocrine neoplasia syndrome type II, or pancreatitis.

## 2022-01-29 NOTE — Assessment & Plan Note (Signed)
She is to continue working on lifestyle modifications.  Patient's weight has been stable.  Will place patient on a GLP-1 receptor agonist that should help facilitate some weight loss.

## 2022-01-29 NOTE — Assessment & Plan Note (Signed)
Patient had a low vitamin D level of 11 she is completed 3 months worth of vitamin D will continue for another 3 months then recheck at next office visit.

## 2022-01-29 NOTE — Telephone Encounter (Signed)
Karen Miller  I saw Quorra in office today. I noticed that you started her on Lasix. She is also on HCTZ. Just making sure you are ok for her to continue both  Thanks Matt

## 2022-01-29 NOTE — Patient Instructions (Signed)
Nice to see you today Let me know if you have any trouble with the once weekly injection of Trulicity I want to see you in 3 months, sooner if you need me

## 2022-01-29 NOTE — Assessment & Plan Note (Signed)
Patient currently managed by cardiology.  Currently maintained on amlodipine, Lasix, HCTZ, valsartan and carvedilol.  Blood pressures have been doing well at home slightly elevated today blood pressure was within normal limits last cardiology appointment.  Did review last BMP performed by cardiology clinic within normal limits.

## 2022-02-04 ENCOUNTER — Encounter: Payer: Self-pay | Admitting: Nurse Practitioner

## 2022-02-04 ENCOUNTER — Ambulatory Visit: Payer: BC Managed Care – PPO | Attending: Nurse Practitioner | Admitting: Nurse Practitioner

## 2022-02-04 VITALS — BP 138/64 | HR 87 | Ht 64.0 in | Wt 258.6 lb

## 2022-02-04 DIAGNOSIS — I3481 Nonrheumatic mitral (valve) annulus calcification: Secondary | ICD-10-CM | POA: Diagnosis not present

## 2022-02-04 DIAGNOSIS — I10 Essential (primary) hypertension: Secondary | ICD-10-CM | POA: Diagnosis not present

## 2022-02-04 DIAGNOSIS — I2510 Atherosclerotic heart disease of native coronary artery without angina pectoris: Secondary | ICD-10-CM

## 2022-02-04 DIAGNOSIS — I342 Nonrheumatic mitral (valve) stenosis: Secondary | ICD-10-CM

## 2022-02-04 DIAGNOSIS — E785 Hyperlipidemia, unspecified: Secondary | ICD-10-CM

## 2022-02-04 DIAGNOSIS — I5032 Chronic diastolic (congestive) heart failure: Secondary | ICD-10-CM

## 2022-02-04 DIAGNOSIS — I480 Paroxysmal atrial fibrillation: Secondary | ICD-10-CM

## 2022-02-04 DIAGNOSIS — Z6841 Body Mass Index (BMI) 40.0 and over, adult: Secondary | ICD-10-CM

## 2022-02-04 DIAGNOSIS — R000 Tachycardia, unspecified: Secondary | ICD-10-CM

## 2022-02-04 DIAGNOSIS — I340 Nonrheumatic mitral (valve) insufficiency: Secondary | ICD-10-CM

## 2022-02-04 DIAGNOSIS — E1169 Type 2 diabetes mellitus with other specified complication: Secondary | ICD-10-CM

## 2022-02-04 DIAGNOSIS — R600 Localized edema: Secondary | ICD-10-CM

## 2022-02-04 NOTE — Telephone Encounter (Signed)
Patient advised.

## 2022-02-04 NOTE — Telephone Encounter (Signed)
Heard back from Caridology NP. We will continue both HCTZ and lasix

## 2022-02-04 NOTE — Progress Notes (Signed)
Office Visit    Patient Name: Karen Miller Date of Encounter: 02/04/2022  Primary Care Provider:  Michela Pitcher, NP Primary Cardiologist:  Elouise Munroe, MD  Chief Complaint    61 year old female with a history of CAD, chronic diastolic heart failure, hypertension, hyperlipidemia, reported paroxysmal atrial fibrillation (no documented history), PVCs, type 2 diabetes, and obesity who presents for follow-up related to hypertension and heart failure.   Past Medical History    Past Medical History:  Diagnosis Date   Diabetes mellitus without complication ([place])    on meds   Hyperlipidemia    on meds   Hypertension    on meds   Past Surgical History:  Procedure Laterality Date   FACIAL FRACTURE SURGERY  2008   right side of the face completely plated-crushed her facial bones on that side   OTHER SURGICAL HISTORY  2008   right arm fractured in multiple areas-from right elbow down to right wrist plated and screws   UMBILICAL HERNIA REPAIR     has mesh   WISDOM TOOTH EXTRACTION      Allergies  No Known Allergies  History of Present Illness    61 year old female with the above past medical history including CAD, chronic diastolic heart failure, hypertension, hyperlipidemia, reported paroxysmal atrial fibrillation (no documented history), PVCs, type 2 diabetes, and obesity.   She was  hospitalized in May 2023 in the setting of hypertensive urgency, acute diastolic heart failure. She valuated at urgent care prior to her hospitalization and was told that she "might be in atrial fibrillation."  EKG from urgent care visit was unavailable for review, therefore, no documented history of atrial fibrillation.  EKG the ED showed sinus tachycardia, no evidence of ischemia, or arrhythmia. Troponin was elevated.  CT of the chest was negative for PE. Cardiology was consulted. Echocardiogram showed EF 65 to 70%, hyperdynamic LV function, severe LVH, indeterminate diastolic parameters,  degenerative mitral valve, mild mitral valve regurgitation, mild to moderate mitral stenosis, severe mitral annular calcification. Coronary CT angiogram revealed coronary calcium score 204, 94th percentile for age, race, and sex-matched controls, minimal plaques in the left main, LAD, and left circumflex arteries, mild plaque in the RCA, mitral annular calcification, and aortic atherosclerosis.  She was given furosemide and started on losartan and HCTZ. Given presence of MAC, blood cultures were obtained however, these were negative. Therefore, TEE was not pursued given low suspicion for endocarditis with negative blood cultures.    Since that time she has had ongoing elevated blood pressure.  She was last seen in the office on 01/02/2022 and was stable from a cardiac standpoint. Her BP was still elevated above goal, with SBP consistently ranging from the 130s-150s. She also reported a sensitivity to light only when shopping at certain stores such as [employer] and [employer]. She also noted a slight increase in bilateral lower extremity edema.  Amlodipine was added to her medication regimen and she was started on Lasix.  She presents today for follow-up.  Since her last visit she has been stable from a cardiac standpoint.  She continues to note sensitivity to light when shopping at [employer] and [employer].  She wonders if this could be related to her cholesterol medication.  She is going to start taking her medication at nighttime to see if this makes a difference.  Her BP has been well-controlled.  She does note ongoing nonpitting bilateral lower extremity edema, appears this is mostly dependent.  Overall, she reports feeling  well.  Home Medications    Current Outpatient Medications  Medication Sig Dispense Refill   amLODipine (NORVASC) 5 MG tablet Take 1 tablet (5 mg total) by mouth daily. 90 tablet 3   aspirin EC (QC LO-DOSE ASPIRIN) 81 MG tablet TAKE 1 TABLET DAILY - SWALLOW WHOLE 90 tablet 1    atorvastatin (LIPITOR) 40 MG tablet Take 1 tablet (40 mg total) by mouth daily. 30 tablet 6   carvedilol (COREG) 12.5 MG tablet Take 0.5 tablets (6.25 mg total) by mouth 2 (two) times daily. (Patient taking differently: Take 12.5 mg by mouth 2 (two) times daily.)     Dulaglutide (TRULICITY) 1.82 XH/3.7JI SOPN Inject 0.75 mg into the skin once a week. 2 mL 0   furosemide (LASIX) 20 MG tablet Take 1 tablet (20 mg total) by mouth daily. 90 tablet 3   hydrochlorothiazide (HYDRODIURIL) 25 MG tablet Take 1 tablet (25 mg total) by mouth daily. 30 tablet 0   metFORMIN (GLUCOPHAGE) 500 MG tablet Take 1 tablet (500 mg total) by mouth daily with breakfast. 90 tablet 3   valsartan (DIOVAN) 320 MG tablet Take 1 tablet (320 mg total) by mouth daily. 90 tablet 3   Vitamin D, Ergocalciferol, (DRISDOL) 1.25 MG (50000 UNIT) CAPS capsule Take 1 capsule (50,000 Units total) by mouth every 7 (seven) days. 12 capsule 0   No current facility-administered medications for this visit.     Review of Systems    She denies chest pain, palpitations, dyspnea, pnd, orthopnea, n, v, dizziness, syncope, weight gain, or early satiety. All other systems reviewed and are otherwise negative except as noted above.    Physical Exam    VS:  BP 138/64   Pulse 87   Ht _0  (1.626 m)   Wt 258 lb 9.6 oz (117.3 kg)   SpO2 99%   BMI 44.39 kg/m  GEN: Well nourished, well developed, in no acute distress. HEENT: normal. Neck: Supple, no JVD, carotid bruits, or masses. Cardiac: RRR, 2/6 murmur, no rubs, or gallops. No clubbing, cyanosis, nonpitting bilateral lower extremity edema.  Radials/DP/PT 2+ and equal bilaterally.  Respiratory:  Respirations regular and unlabored, clear to auscultation bilaterally. GI: Soft, nontender, nondistended, BS + x 4. MS: no deformity or atrophy. Skin: warm and dry, no rash. Neuro:  Strength and sensation are intact. Psych: Normal affect.  Accessory Clinical Findings    ECG personally reviewed by  me today - No EKG in office today.   Lab Results  Component Value Date   WBC 8.6 10/28/2021   HGB 12.7 10/28/2021   HCT 37.9 10/28/2021   MCV 93.6 10/28/2021   PLT 311.0 10/28/2021   Lab Results  Component Value Date   CREATININE 0.83 01/16/2022   BUN 25 01/16/2022   NA 139 01/16/2022   K 4.6 01/16/2022   CL 97 01/16/2022   CO2 27 01/16/2022   Lab Results  Component Value Date   ALT 22 09/04/2021   AST 21 09/04/2021   ALKPHOS 83 09/04/2021   BILITOT 0.5 09/04/2021   Lab Results  Component Value Date   CHOL 131 09/04/2021   HDL 42 09/04/2021   LDLCALC 60 09/04/2021   TRIG 170 (H) 09/04/2021   CHOLHDL 3.1 09/04/2021    Lab Results  Component Value Date   HGBA1C 6.7 (A) 01/29/2022    Assessment & Plan    1. Hypertension: BP well controlled. Continue current antihypertensive regimen.    2. CAD: Coronary CT angiogram revealed coronary calcium score  68, 94th percentile for age, race, and sex-matched controls, minimal plaques in the left main, LAD, and left circumflex arteries, mild plaque in the RCA, mitral annular calcification, and aortic atherosclerosis. Stable with no anginal symptoms. No indication for ischemic evaluation. Continue aspirin, valsartan, carvedilol, amlodipine, hydrochlorothiazide, and Lipitor.  3. Chronic diastolic heart failure: Echo in 07/2021 showed EF 65 to 70%, hyperdynamic LV function, severe LVH, indeterminate diastolic parameters, degenerative mitral valve, mild mitral valve regurgitation, mild to moderate mitral stenosis, severe mitral annular calcification.  She notes ongoing nonpitting bilateral lower extremity edema, appears to be mostly dependent.  Will check BNP today.  If elevated, consider escalation of Lasix.  If BMP unremarkable, recommend compression, elevation.  For now, continue Lasix at current dose.    4. Valvular heart disease: Recent echo as above. 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 TEE was not pursued. She is asymptomatic.  Consider repeat echocardiogram in 1 year.   5. Reported paroxysmal atrial fibrillation/Sinus tachycardia: No documented evidence of atrial fibrillation.  She is not on anticoagulation. Previously declined cardiac monitor.  HR stable.  Denies any recent palpitations.   6. Hyperlipidemia: LDL was 60 in June 2023.  Continue aspirin, Lipitor.   7. Type 2 diabetes/obesity:  A1c was 6.4 in 10/2021. Monitored and managed per PCP.  Encouraged ongoing lifestyle modifications with diet and exercise.  She is getting ready to start Trulicity.   8. Disposition: Follow-up in 4-6 months with Dr. Margaretann Loveless.      Lenna Sciara, NP 02/04/2022, 8:58 AM

## 2022-02-04 NOTE — Patient Instructions (Signed)
Medication Instructions:  Your physician recommends that you continue on your current medications as directed. Please refer to the Current Medication list given to you today.   *If you need a refill on your cardiac medications before your next appointment, please call your pharmacy*   Lab Work: Your physician recommends that you complete labs today BNP  If you have labs (blood work) drawn today and your tests are completely normal, you will receive your results only by: MyChart Message (if you have MyChart) OR A paper copy in the mail If you have any lab test that is abnormal or we need to change your treatment, we will call you to review the results.   Testing/Procedures: NONE ordered at this time of appointment     Follow-Up: At [facility], you and your health needs are our priority.  As part of our continuing mission to provide you with exceptional heart care, we have created designated Provider Care Teams.  These Care Teams include your primary Cardiologist (physician) and Advanced Practice Providers (APPs -  Physician Assistants and Nurse Practitioners) who all work together to provide you with the care you need, when you need it.  We recommend signing up for the patient portal called "MyChart".  Sign up information is provided on this After Visit Summary.  MyChart is used to connect with patients for Virtual Visits (Telemedicine).  Patients are able to view lab/test results, encounter notes, upcoming appointments, etc.  Non-urgent messages can be sent to your provider as well.   To learn more about what you can do with MyChart, go to [email or web address].    Your next appointment:   4-6 month(s)  The format for your next appointment:   In Person  Provider:   Elouise Munroe, MD  or Diona Browner, NP        Other Instructions   Important Information About Sugar

## 2022-02-05 LAB — BRAIN NATRIURETIC PEPTIDE: BNP: 41 pg/mL (ref 0.0–100.0)

## 2022-02-18 ENCOUNTER — Other Ambulatory Visit: Payer: Self-pay | Admitting: Nurse Practitioner

## 2022-02-18 DIAGNOSIS — E1169 Type 2 diabetes mellitus with other specified complication: Secondary | ICD-10-CM

## 2022-02-19 ENCOUNTER — Other Ambulatory Visit: Payer: Self-pay | Admitting: Nurse Practitioner

## 2022-02-19 DIAGNOSIS — E1169 Type 2 diabetes mellitus with other specified complication: Secondary | ICD-10-CM

## 2022-02-19 MED ORDER — TRULICITY 1.5 MG/0.5ML ~~LOC~~ SOAJ: 1.5000 mg | SUBCUTANEOUS | 0 refills | Status: DC

## 2022-03-20 ENCOUNTER — Other Ambulatory Visit: Payer: Self-pay | Admitting: Nurse Practitioner

## 2022-03-20 DIAGNOSIS — E1169 Type 2 diabetes mellitus with other specified complication: Secondary | ICD-10-CM

## 2022-03-21 ENCOUNTER — Other Ambulatory Visit: Payer: Self-pay | Admitting: Nurse Practitioner

## 2022-03-21 DIAGNOSIS — E1169 Type 2 diabetes mellitus with other specified complication: Secondary | ICD-10-CM

## 2022-03-21 NOTE — Telephone Encounter (Signed)
Spoke with patient and she states she has been getting one month supply from pharmacy. She uses one pen per week.   Spoke with pharmacy and they state patient does have refills available. Patient has been advised.   Nothing further needed at this time.

## 2022-05-01 ENCOUNTER — Encounter: Payer: Self-pay | Admitting: Nurse Practitioner

## 2022-05-01 ENCOUNTER — Ambulatory Visit: Payer: BC Managed Care – PPO | Admitting: Nurse Practitioner

## 2022-05-01 VITALS — BP 136/72 | HR 84 | Temp 98.0°F | Resp 16 | Ht 64.0 in | Wt 258.5 lb

## 2022-05-01 DIAGNOSIS — Z23 Encounter for immunization: Secondary | ICD-10-CM

## 2022-05-01 DIAGNOSIS — W19XXXA Unspecified fall, initial encounter: Secondary | ICD-10-CM | POA: Diagnosis not present

## 2022-05-01 DIAGNOSIS — E1169 Type 2 diabetes mellitus with other specified complication: Secondary | ICD-10-CM

## 2022-05-01 DIAGNOSIS — I152 Hypertension secondary to endocrine disorders: Secondary | ICD-10-CM

## 2022-05-01 DIAGNOSIS — E559 Vitamin D deficiency, unspecified: Secondary | ICD-10-CM

## 2022-05-01 DIAGNOSIS — E785 Hyperlipidemia, unspecified: Secondary | ICD-10-CM

## 2022-05-01 DIAGNOSIS — E1159 Type 2 diabetes mellitus with other circulatory complications: Secondary | ICD-10-CM | POA: Diagnosis not present

## 2022-05-01 LAB — COMPREHENSIVE METABOLIC PANEL
ALT: 23 U/L (ref 0–35)
AST: 19 U/L (ref 0–37)
Albumin: 4.2 g/dL (ref 3.5–5.2)
Alkaline Phosphatase: 73 U/L (ref 39–117)
BUN: 30 mg/dL — ABNORMAL HIGH (ref 6–23)
CO2: 30 mEq/L (ref 19–32)
Calcium: 9.9 mg/dL (ref 8.4–10.5)
Chloride: 98 mEq/L (ref 96–112)
Creatinine, Ser: 0.9 mg/dL (ref 0.40–1.20)
GFR: 68.95 mL/min (ref >60.00)
Glucose, Bld: 94 mg/dL (ref 70–99)
Potassium: 4.4 mEq/L (ref 3.5–5.1)
Sodium: 136 mEq/L (ref 135–145)
Total Bilirubin: 0.6 mg/dL (ref 0.2–1.2)
Total Protein: 7.6 g/dL (ref 6.0–8.3)

## 2022-05-01 LAB — POCT GLYCOSYLATED HEMOGLOBIN (HGB A1C): Hemoglobin A1C: 6.1 % — AB (ref 4.0–5.6)

## 2022-05-01 LAB — CBC
HCT: 35.6 % — ABNORMAL LOW (ref 36.0–46.0)
Hemoglobin: 12.1 g/dL (ref 12.0–15.0)
MCHC: 34.1 g/dL (ref 30.0–36.0)
MCV: 90.6 fl (ref 78.0–100.0)
Platelets: 333 10*3/uL (ref 150.0–400.0)
RBC: 3.93 Mil/uL (ref 3.87–5.11)
RDW: 12.9 % (ref 11.5–15.5)
WBC: 8.6 10*3/uL (ref 4.0–10.5)

## 2022-05-01 LAB — VITAMIN D 25 HYDROXY (VIT D DEFICIENCY, FRACTURES): VITD: 30.9 ng/mL (ref 30.00–100.00)

## 2022-05-01 NOTE — Assessment & Plan Note (Signed)
Patient currently maintained on metformin 500 mg daily and Trulicity 1.5 mg weekly tolerating both medications well.  A1c has come down to 6.1%.  Continue therapy as is no changes

## 2022-05-01 NOTE — Assessment & Plan Note (Signed)
Patient has been on prescription vitamin D replacement for several months.  Will check vitamin D today, pending result

## 2022-05-01 NOTE — Assessment & Plan Note (Signed)
Patient has not been walking as of late due to the weather.  She is on Trulicity but no appreciable weight loss at this juncture.  Continue working on healthy lifestyle modifications

## 2022-05-01 NOTE — Assessment & Plan Note (Signed)
Patient states she fell back in December was evaluated.  Does not like it was orthostatic syncope.  Did discuss changing positions slowly.

## 2022-05-01 NOTE — Assessment & Plan Note (Signed)
Patient currently maintained on amlodipine, carvedilol, furosemide, hydrochlorothiazide, and valsartan.  Blood pressure is within normal limits.  Patient does have some orthostatic changes per her report we did discuss about changing positions slowly.  States that she did fall back in December

## 2022-05-01 NOTE — Patient Instructions (Signed)
Nice to see you today We did up date your tetanus vaccine today Think about the shingles vaccine You can call and get the pneumonia vaccine at any point Follow up with me in 3 months, sooner if you need me

## 2022-05-01 NOTE — Progress Notes (Signed)
Established Patient Office Visit  Subjective   Patient ID: Karen Miller, female    DOB: 25-Aug-1960  Age: 62 y.o. MRN: [id number]  Chief Complaint  Patient presents with   Diabetes      DM2: Patient was maintained on metformin 500 mg daily we did start her on Trulicity [id number] she was titrated Trulicity 1.5 mg.  She is here today for follow-up  HTN: Patient is followed by cardiology currently maintained on amlodipine, carvedilol, furosemide, hydrochlorothiazide, valsartan.  Vitamin D deficiency: Patient has history of the same she has been on vitamin D replacement  Fall: states that she fell 02/23/2022. States that she stood got lightheaded and then fell. Did not hit head. States that her buttock was sore but has improved.   Review of Systems  Constitutional:  Negative for chills and fever.  Respiratory:  Negative for shortness of breath.   Cardiovascular:  Negative for chest pain.  Gastrointestinal:  Negative for abdominal pain, constipation, diarrhea, nausea and vomiting.  Neurological:  Negative for headaches.  Psychiatric/Behavioral:  Negative for hallucinations and suicidal ideas.       Objective:     BP 136/72   Pulse 84   Temp 98 F (36.7 C)   Resp 16   Ht 5' 4"$  (1.626 m)   Wt 258 lb 8 oz (117.3 kg)   SpO2 99%   BMI 44.37 kg/m  BP Readings from Last 3 Encounters:  05/01/22 136/72  02/04/22 138/64  01/29/22 (!) 150/84   Wt Readings from Last 3 Encounters:  05/01/22 258 lb 8 oz (117.3 kg)  02/04/22 258 lb 9.6 oz (117.3 kg)  01/29/22 259 lb 6 oz (117.7 kg)      Physical Exam Vitals and nursing note reviewed.  Constitutional:      Appearance: Normal appearance. She is obese.  Cardiovascular:     Rate and Rhythm: Normal rate and regular rhythm.     Pulses:          Dorsalis pedis pulses are 1+ on the right side and 1+ on the left side.       Posterior tibial pulses are 1+ on the right side and 1+ on the left side.     Heart sounds: Normal heart  sounds.  Pulmonary:     Effort: Pulmonary effort is normal.     Breath sounds: Normal breath sounds.  Musculoskeletal:     Right lower leg: No edema.     Left lower leg: No edema.  Feet:     Right foot:     Skin integrity: Dry skin present.     Left foot:     Skin integrity: Dry skin present.  Skin:    General: Skin is warm.  Neurological:     Mental Status: She is alert.      Results for orders placed or performed in visit on 05/01/22  POCT glycosylated hemoglobin (Hb A1C)  Result Value Ref Range   Hemoglobin A1C 6.1 (A) 4.0 - 5.6 %   HbA1c POC (<> result, manual entry)     HbA1c, POC (prediabetic range)     HbA1c, POC (controlled diabetic range)        The 10-year ASCVD risk score (Arnett DK, et al., 2019) is: 9.1%    Assessment & Plan:   Problem List Items Addressed This Visit       Cardiovascular and Mediastinum   Hypertension associated with diabetes [facility])    Patient currently maintained on amlodipine, carvedilol, furosemide,  hydrochlorothiazide, and valsartan.  Blood pressure is within normal limits.  Patient does have some orthostatic changes per her report we did discuss about changing positions slowly.  States that she did fall back in December        Endocrine   Type 2 diabetes mellitus with hyperlipidemia ([place]) - Primary    Patient currently maintained on metformin 500 mg daily and Trulicity 1.5 mg weekly tolerating both medications well.  A1c has come down to 6.1%.  Continue therapy as is no changes      Relevant Orders   POCT glycosylated hemoglobin (Hb A1C) (Completed)   CBC   Comprehensive metabolic panel     Other   Obesity, Class III, BMI 40-49.9 (morbid obesity) ([place])    Patient has not been walking as of late due to the weather.  She is on Trulicity but no appreciable weight loss at this juncture.  Continue working on healthy lifestyle modifications      Vitamin D deficiency    Patient has been on prescription vitamin D replacement for  several months.  Will check vitamin D today, pending result      Relevant Orders   VITAMIN D 25 Hydroxy (Vit-D Deficiency, Fractures)   Fall    Patient states she fell back in December was evaluated.  Does not like it was orthostatic syncope.  Did discuss changing positions slowly.       Return in about 3 months (around 07/30/2022) for DM recheck.    Romilda Garret, NP

## 2022-05-02 ENCOUNTER — Other Ambulatory Visit: Payer: Self-pay | Admitting: Nurse Practitioner

## 2022-05-02 DIAGNOSIS — E559 Vitamin D deficiency, unspecified: Secondary | ICD-10-CM

## 2022-05-02 MED ORDER — VITAMIN D (ERGOCALCIFEROL) 1.25 MG (50000 UNIT) PO CAPS: 50000.0000 [IU] | ORAL_CAPSULE | ORAL | 0 refills | Status: DC

## 2022-05-10 ENCOUNTER — Other Ambulatory Visit: Payer: Self-pay | Admitting: Nurse Practitioner

## 2022-05-10 DIAGNOSIS — I152 Hypertension secondary to endocrine disorders: Secondary | ICD-10-CM

## 2022-05-12 ENCOUNTER — Other Ambulatory Visit: Payer: Self-pay | Admitting: Nurse Practitioner

## 2022-05-12 ENCOUNTER — Other Ambulatory Visit: Payer: Self-pay

## 2022-05-12 DIAGNOSIS — I152 Hypertension secondary to endocrine disorders: Secondary | ICD-10-CM

## 2022-05-12 MED ORDER — HYDROCHLOROTHIAZIDE 25 MG PO TABS: 25.0000 mg | ORAL_TABLET | Freq: Every day | ORAL | 0 refills | Status: DC

## 2022-05-14 ENCOUNTER — Other Ambulatory Visit: Payer: Self-pay | Admitting: Nurse Practitioner

## 2022-05-14 DIAGNOSIS — E1169 Type 2 diabetes mellitus with other specified complication: Secondary | ICD-10-CM

## 2022-05-15 ENCOUNTER — Other Ambulatory Visit: Payer: Self-pay | Admitting: Nurse Practitioner

## 2022-05-15 DIAGNOSIS — E1169 Type 2 diabetes mellitus with other specified complication: Secondary | ICD-10-CM

## 2022-06-08 NOTE — Progress Notes (Unsigned)
Office Visit    Patient Name: Karen Miller Date of Encounter: 06/09/2022  Primary Care Provider:  Michela Pitcher, NP Primary Cardiologist:  Elouise Munroe, MD  History of Present Illness    Karen Miller is a 62 y.o.-year-old female with a history of CAD, chronic diastolic heart failure, hypertension, hyperlipidemia, reported paroxysmal atrial fibrillation (no documented history), PVCs, type 2 diabetes, and obesity who presents for follow-up related to hypertension and heart failure.    She was  hospitalized in May 2023 in the setting of hypertensive urgency, acute diastolic heart failure. She was evaluated at urgent care prior to her hospitalization and was told that she "might be in atrial fibrillation."  EKG from urgent care visit was unavailable for review, therefore, no documented history of atrial fibrillation.  EKG the ED showed sinus tachycardia, no evidence of ischemia, or arrhythmia. Troponin was elevated.  CT of the chest was negative for PE. Cardiology was consulted. Echocardiogram showed EF 65 to 70%, hyperdynamic LV function, severe LVH, indeterminate diastolic parameters, degenerative mitral valve, mild mitral valve regurgitation, mild to moderate mitral stenosis, severe mitral annular calcification. Coronary CT angiogram revealed coronary calcium score 204, 94th percentile for age, race, and sex-matched controls, minimal plaques in the left main, LAD, and left circumflex arteries, mild plaque in the RCA, mitral annular calcification, and aortic atherosclerosis.  She was given furosemide and started on losartan and HCTZ. Given presence of MAC, blood cultures were obtained however, these were negative. Therefore, TEE was not pursued given low suspicion for endocarditis with negative blood cultures.    Since that time she has had ongoing elevated blood pressure. 12/2021 Her BP was still elevated above goal, with SBP consistently ranging from the 130s-150s. She also reported a  sensitivity to light only when shopping at certain stores such as [employer] and [employer]. She also noted a slight increase in bilateral lower extremity edema.  Amlodipine was added to her medication regimen and she was started on Lasix. Since her last visit she has been stable from a cardiac standpoint.  She continues to note sensitivity to light when shopping at [employer] and [employer].  She wonders if this could be related to her cholesterol medication.  She is going to start taking her medication at nighttime to see if this makes a difference.  Her BP has been well-controlled.  She does note ongoing nonpitting bilateral lower extremity edema, appears this is mostly dependent.  06/09/22: Overall feeling well.  Notes that she continues to have symptoms when shopping at [facility].  She describes a tremulousness at times in her hands.  Also notes that while shopping at [facility] she has blurry vision and her legs feel weak.  Her husband wonders if this is related to low blood pressure episodes.  I have encouraged her to stop at the pharmacy at [facility] when she feels poorly and check her blood pressure with the available BP cuff.  Encourage her to write in with these readings so that we can further adjust therapy as needed.  No chest pain or shortness of breath.  Of note, does have mitral annular calcification with a gradient of approximately 9 mmHg at a heart rate of 100 on her last echocardiogram May 2023.  Recommended we repeat echo today given normalization of heart rate.  Fasting today and will repeat lipids and obtain LP(a).  I am happy to obtain a vitamin B12 level to exclude this as a cause of some of her symptoms,  management will be per primary.  We discussed compression socks in the event her episodes are related to venous pooling given her known lower extremity edema and possible positional hypotension.  She will obtain these later this week.  Past Medical History    Past Medical History:  Diagnosis Date    Diabetes mellitus without complication    on meds   Hyperlipidemia    on meds   Hypertension    on meds   Past Surgical History:  Procedure Laterality Date   FACIAL FRACTURE SURGERY  2008   right side of the face completely plated-crushed her facial bones on that side   OTHER SURGICAL HISTORY  2008   right arm fractured in multiple areas-from right elbow down to right wrist plated and screws   UMBILICAL HERNIA REPAIR     has mesh   WISDOM TOOTH EXTRACTION      Allergies  No Known Allergies   Home Medications    Current Outpatient Medications  Medication Sig Dispense Refill   amLODipine (NORVASC) 5 MG tablet Take 1 tablet (5 mg total) by mouth daily. 90 tablet 3   aspirin EC (QC LO-DOSE ASPIRIN) 81 MG tablet TAKE 1 TABLET DAILY - SWALLOW WHOLE 90 tablet 1   atorvastatin (LIPITOR) 40 MG tablet Take 1 tablet (40 mg total) by mouth daily. 30 tablet 6   carvedilol (COREG) 12.5 MG tablet Take 0.5 tablets (6.25 mg total) by mouth 2 (two) times daily. (Patient taking differently: Take 12.5 mg by mouth 2 (two) times daily.)     Dulaglutide (TRULICITY) 1.5 0000000 SOPN Inject 1.5 mg into the skin once a week. 6 mL 1   furosemide (LASIX) 20 MG tablet Take 1 tablet (20 mg total) by mouth daily. 90 tablet 3   hydrochlorothiazide (HYDRODIURIL) 25 MG tablet Take 1 tablet (25 mg total) by mouth daily. 90 tablet 0   metFORMIN (GLUCOPHAGE) 500 MG tablet Take 1 tablet (500 mg total) by mouth daily with breakfast. 90 tablet 3   valsartan (DIOVAN) 320 MG tablet Take 1 tablet (320 mg total) by mouth daily. 90 tablet 3   Vitamin D, Ergocalciferol, (DRISDOL) 1.25 MG (50000 UNIT) CAPS capsule Take 1 capsule (50,000 Units total) by mouth every 7 (seven) days. 4 capsule 0   No current facility-administered medications for this visit.     Review of Systems    Negative except per HPI  Physical Exam    VS:  BP (!) 142/76 (BP Location: Left Arm, Patient Position: Sitting, Cuff Size: Large)   Pulse  85   Ht 5\' 4"  (1.626 m)   Wt 260 lb (117.9 kg)   SpO2 97%   BMI 44.63 kg/m  Constitutional: No acute distress Eyes: sclera non-icteric, normal conjunctiva and lids ENMT: normal dentition, moist mucous membranes Cardiovascular: regular rhythm, normal rate, 1/6 apical diastolic murmur. S1 and S2 normal. No jugular venous distention.  Respiratory: clear to auscultation bilaterally GI : normal bowel sounds, soft and nontender. No distention.   MSK: extremities warm, well perfused. 1+ puffy edema.  NEURO: grossly nonfocal exam, moves all extremities. PSYCH: alert and oriented x 3, normal mood and affect.    Accessory Clinical Findings    ECG: NSR   Lab Results  Component Value Date   WBC 8.6 05/01/2022   HGB 12.1 05/01/2022   HCT 35.6 (L) 05/01/2022   MCV 90.6 05/01/2022   PLT 333.0 05/01/2022   Lab Results  Component Value Date   CREATININE 0.90 05/01/2022  BUN 30 (H) 05/01/2022   NA 136 05/01/2022   K 4.4 05/01/2022   CL 98 05/01/2022   CO2 30 05/01/2022   Lab Results  Component Value Date   ALT 23 05/01/2022   AST 19 05/01/2022   ALKPHOS 73 05/01/2022   BILITOT 0.6 05/01/2022   Lab Results  Component Value Date   CHOL 131 09/04/2021   HDL 42 09/04/2021   LDLCALC 60 09/04/2021   TRIG 170 (H) 09/04/2021   CHOLHDL 3.1 09/04/2021    Lab Results  Component Value Date   HGBA1C 6.1 (A) 05/01/2022    Assessment & Plan    1. Hypertension: BP well controlled. Continue current antihypertensive regimen.  She will check her blood pressure when feeling poorly at [facility] and report back.   2. CAD: Coronary CT angiogram revealed coronary calcium score 204, 94th percentile for age, race, and sex-matched controls, minimal plaques in the left main, LAD, and left circumflex arteries, mild plaque in the RCA, mitral annular calcification, and aortic atherosclerosis. Stable with no anginal symptoms. Continue aspirin, valsartan, carvedilol, amlodipine, hydrochlorothiazide, and  Lipitor.  3. Chronic diastolic heart failure: Echo in 07/2021 showed EF 65 to 70%, hyperdynamic LV function, severe LVH, indeterminate diastolic parameters, degenerative mitral valve, mild mitral valve regurgitation, mild to moderate mitral stenosis, severe mitral annular calcification.  She notes ongoing nonpitting bilateral lower extremity edema, appears to be mostly dependent.  BNP was normal at last visit.  Continue Lasix at current dose.   4. Valvular heart disease: Repeat echocardiogram now and reevaluate degree of mitral stenosis which appears at least mild annular calcification.  5. Reported paroxysmal atrial fibrillation/Sinus tachycardia: No documented evidence of atrial fibrillation.  She is not on anticoagulation. Previously declined cardiac monitor.  HR stable.  Denies any recent palpitations.  Will plan to perform a cardiac monitor if she continues to have symptoms while shopping.   6. Hyperlipidemia: LDL was 60 in June 2023.  Continue aspirin, Lipitor.   7. Type 2 diabetes/obesity:  A1c was 6.1. Monitored and managed per PCP.  Encouraged ongoing lifestyle modifications with diet and exercise.  She is on Trulicity.  Total time of encounter: 30 minutes total time of encounter, including 20 minutes spent in face-to-face patient care on the date of this encounter. This time includes coordination of care and counseling regarding above mentioned problem list. Remainder of non-face-to-face time involved reviewing chart documents/testing relevant to the patient encounter and documentation in the medical record. I have independently reviewed documentation from referring provider.   Cherlynn Kaiser, MD, [place]   No orders of the defined types were placed in this encounter.  Orders Placed This Encounter  Procedures   Lipid panel   Lipoprotein A (LPA)   B12   EKG 12-Lead   ECHOCARDIOGRAM COMPLETE    Patient Instructions  Medication Instructions:  No Changes  In Medications at this time.  *If you need a refill on your cardiac medications before your next appointment, please call your pharmacy*  Lab Work: BLOOD WORK TODAY  If you have labs (blood work) drawn today and your tests are completely normal, you will receive your results only by: [place] (if you have MyChart) OR A paper copy in the mail If you have any lab test that is abnormal or we need to change your treatment, we will call you to review the results.  Testing/Procedures: Your physician has requested that you have an echocardiogram. Echocardiography is a painless test that  uses sound waves to create images of your heart. It provides your doctor with information about the size and shape of your heart and how well your heart's chambers and valves are working. You may receive an ultrasound enhancing agent through an IV if needed to better visualize your heart during the echo.This procedure takes approximately one hour. There are no restrictions for this procedure. This will take place at the [address], [address].   Follow-Up: At [facility], you and your health needs are our priority.  As part of our continuing mission to provide you with exceptional heart care, we have created designated Provider Care Teams.  These Care Teams include your primary Cardiologist (physician) and Advanced Practice Providers (APPs -  Physician Assistants and Nurse Practitioners) who all work together to provide you with the care you need, when you need it.  Your next appointment:   6 month(s)  Provider:   Elouise Munroe, MD     Other Instructions PLEASE [place]

## 2022-06-09 ENCOUNTER — Encounter: Payer: Self-pay | Admitting: Internal Medicine

## 2022-06-09 ENCOUNTER — Ambulatory Visit: Payer: BC Managed Care – PPO | Attending: Internal Medicine | Admitting: Internal Medicine

## 2022-06-09 VITALS — BP 142/76 | HR 85 | Ht 64.0 in | Wt 260.0 lb

## 2022-06-09 DIAGNOSIS — I2510 Atherosclerotic heart disease of native coronary artery without angina pectoris: Secondary | ICD-10-CM

## 2022-06-09 DIAGNOSIS — I5032 Chronic diastolic (congestive) heart failure: Secondary | ICD-10-CM | POA: Diagnosis not present

## 2022-06-09 NOTE — Patient Instructions (Signed)
Medication Instructions:  No Changes In Medications at this time.  *If you need a refill on your cardiac medications before your next appointment, please call your pharmacy*  Lab Work: BLOOD WORK TODAY  If you have labs (blood work) drawn today and your tests are completely normal, you will receive your results only by: [place] (if you have MyChart) OR A paper copy in the mail If you have any lab test that is abnormal or we need to change your treatment, we will call you to review the results.  Testing/Procedures: Your physician has requested that you have an echocardiogram. Echocardiography is a painless test that uses sound waves to create images of your heart. It provides your doctor with information about the size and shape of your heart and how well your heart's chambers and valves are working. You may receive an ultrasound enhancing agent through an IV if needed to better visualize your heart during the echo.This procedure takes approximately one hour. There are no restrictions for this procedure. This will take place at the [address], [address].   Follow-Up: At [facility], you and your health needs are our priority.  As part of our continuing mission to provide you with exceptional heart care, we have created designated Provider Care Teams.  These Care Teams include your primary Cardiologist (physician) and Advanced Practice Providers (APPs -  Physician Assistants and Nurse Practitioners) who all work together to provide you with the care you need, when you need it.  Your next appointment:   6 month(s)  Provider:   Elouise Munroe, MD     Other Instructions PLEASE [place]

## 2022-06-10 LAB — LIPID PANEL
Chol/HDL Ratio: 3 ratio (ref 0.0–4.4)
Cholesterol, Total: 125 mg/dL (ref 100–199)
HDL: 42 mg/dL (ref >39)
LDL Chol Calc (NIH): 56 mg/dL (ref 0–99)
Triglycerides: 159 mg/dL — ABNORMAL HIGH (ref 0–149)
VLDL Cholesterol Cal: 27 mg/dL (ref 5–40)

## 2022-06-10 LAB — VITAMIN B12: Vitamin B-12: 226 pg/mL — ABNORMAL LOW (ref 232–1245)

## 2022-06-10 LAB — LIPOPROTEIN A (LPA): Lipoprotein (a): 194.5 nmol/L — ABNORMAL HIGH (ref <75.0)

## 2022-06-12 ENCOUNTER — Encounter: Payer: Self-pay | Admitting: Internal Medicine

## 2022-06-19 ENCOUNTER — Telehealth: Payer: Self-pay | Admitting: Nurse Practitioner

## 2022-06-19 ENCOUNTER — Encounter: Payer: Self-pay | Admitting: Nurse Practitioner

## 2022-06-19 NOTE — Telephone Encounter (Signed)
-----   Message from Parke Poisson, MD sent at 06/18/2022 10:44 PM EDT ----- LDL optimized. Trig mildly elevated. Would optimize diet and continue to manage diabetes. Lpa is elevated, which suggests we should remain aggressive. We could consider increasing dose of atorvastatin to 80 mg daily if she would like to trial and repeat labs in 3 mo (lft, lipids). In setting of tremulousness, b12 level obtained and mildly reduced, recommend follow up with PCP for management. Mordecai Maes NP cc'ed.

## 2022-06-19 NOTE — Telephone Encounter (Signed)
Got notice from her Cardiologist about her having a low B12 level. She can start taking 1,084mcg of B12 daily that is over the counter

## 2022-06-19 NOTE — Telephone Encounter (Signed)
Called and informed pt of this information. 

## 2022-06-19 NOTE — Telephone Encounter (Signed)
Can we get patient scheduled for an acute office visit please. See mychart messages

## 2022-06-19 NOTE — Telephone Encounter (Signed)
Scheduled for 4/12

## 2022-06-20 ENCOUNTER — Ambulatory Visit: Payer: BC Managed Care – PPO | Admitting: Nurse Practitioner

## 2022-06-20 ENCOUNTER — Encounter: Payer: Self-pay | Admitting: Nurse Practitioner

## 2022-06-20 VITALS — BP 130/72 | HR 88 | Temp 98.3°F | Resp 16 | Ht 64.0 in | Wt 261.1 lb

## 2022-06-20 DIAGNOSIS — R252 Cramp and spasm: Secondary | ICD-10-CM | POA: Diagnosis not present

## 2022-06-20 DIAGNOSIS — R251 Tremor, unspecified: Secondary | ICD-10-CM | POA: Diagnosis not present

## 2022-06-20 LAB — COMPREHENSIVE METABOLIC PANEL
ALT: 18 U/L (ref 0–35)
AST: 15 U/L (ref 0–37)
Albumin: 4.2 g/dL (ref 3.5–5.2)
Alkaline Phosphatase: 80 U/L (ref 39–117)
BUN: 31 mg/dL — ABNORMAL HIGH (ref 6–23)
CO2: 30 mEq/L (ref 19–32)
Calcium: 9.7 mg/dL (ref 8.4–10.5)
Chloride: 100 mEq/L (ref 96–112)
Creatinine, Ser: 1.04 mg/dL (ref 0.40–1.20)
GFR: 57.91 mL/min — ABNORMAL LOW (ref >60.00)
Glucose, Bld: 92 mg/dL (ref 70–99)
Potassium: 4.9 mEq/L (ref 3.5–5.1)
Sodium: 139 mEq/L (ref 135–145)
Total Bilirubin: 0.4 mg/dL (ref 0.2–1.2)
Total Protein: 7.3 g/dL (ref 6.0–8.3)

## 2022-06-20 LAB — MAGNESIUM: Magnesium: 1.9 mg/dL (ref 1.5–2.5)

## 2022-06-20 LAB — CBC
HCT: 35.8 % — ABNORMAL LOW (ref 36.0–46.0)
Hemoglobin: 12.2 g/dL (ref 12.0–15.0)
MCHC: 33.9 g/dL (ref 30.0–36.0)
MCV: 90.6 fl (ref 78.0–100.0)
Platelets: 340 10*3/uL (ref 150.0–400.0)
RBC: 3.95 Mil/uL (ref 3.87–5.11)
RDW: 13 % (ref 11.5–15.5)
WBC: 8.8 10*3/uL (ref 4.0–10.5)

## 2022-06-20 LAB — TSH: TSH: 2.58 u[IU]/mL (ref 0.35–5.50)

## 2022-06-20 NOTE — Telephone Encounter (Signed)
Noted  

## 2022-06-20 NOTE — Assessment & Plan Note (Signed)
B12 level is low we are replacing orally with 100 mcg daily.  Patient states that started after using Trulicity we will do a 2-week holiday and Trulicity.  Pending magnesium and electrolytes

## 2022-06-20 NOTE — Progress Notes (Signed)
Acute Office Visit  Subjective:     Patient ID: Karen Miller, female    DOB: 1960-08-14, 62 y.o.   MRN: [id number]  Chief Complaint  Patient presents with   Medication Consultation    Muscle twitching and shaking    HPI Patient is in today for muscle twitching and shaking with a history of DM2, CHF, vitamin D def.   Patient had sent a mychart message stating that over the past month that she was have shakes with muscle twitching and she was thinking it was a side effect of the trulicity   States that she is having a bilateral tremor that has been going on for approx 2 months. States that it does not interfere with movement or gipr. Worse rest and laying down. States that it is about the same and has not gotten worse No history of seizures. No alcohol use. No over the counter  Review of Systems  Constitutional:  Negative for chills and fever.  Respiratory:  Negative for shortness of breath.   Cardiovascular:  Negative for chest pain.  Neurological:  Positive for tremors. Negative for tingling, weakness and headaches.  Psychiatric/Behavioral:  Negative for hallucinations and suicidal ideas.         Objective:    BP 130/72   Pulse 88   Temp 98.3 F (36.8 C)   Resp 16   Ht 5\' 4"  (1.626 m)   Wt 261 lb 2 oz (118.4 kg)   SpO2 98%   BMI 44.82 kg/m  BP Readings from Last 3 Encounters:  06/20/22 130/72  06/09/22 (!) 142/76  05/01/22 136/72   Wt Readings from Last 3 Encounters:  06/20/22 261 lb 2 oz (118.4 kg)  06/09/22 260 lb (117.9 kg)  05/01/22 258 lb 8 oz (117.3 kg)      Physical Exam Vitals and nursing note reviewed.  Constitutional:      Appearance: Normal appearance.  HENT:     Mouth/Throat:     Mouth: Mucous membranes are moist.     Pharynx: Oropharynx is clear.  Eyes:     Extraocular Movements: Extraocular movements intact.     Pupils: Pupils are equal, round, and reactive to light.  Musculoskeletal:     Comments: Resting tremor to bilateral  upper extremities   Neurological:     General: No focal deficit present.     Mental Status: She is alert.     Cranial Nerves: Cranial nerves 2-12 are intact.     Sensory: Sensation is intact.     Motor: Motor function is intact.     Gait: Gait is intact.     Deep Tendon Reflexes:     Reflex Scores:      Bicep reflexes are 1+ on the right side and 1+ on the left side.      Patellar reflexes are 1+ on the right side and 1+ on the left side.    Comments: Bilateral upper and lower extremity 5/5     No results found for any visits on 06/20/22.      Assessment & Plan:   Problem List Items Addressed This Visit       Other   Muscle cramping    Pending magnesium and electrolytes.      Relevant Orders   Magnesium   Tremor - Primary    B12 level is low we are replacing orally with 100 mcg daily.  Patient states that started after using Trulicity we will do a 2-week holiday  and Trulicity.  Pending magnesium and electrolytes      Relevant Orders   CBC   Comprehensive metabolic panel   TSH    No orders of the defined types were placed in this encounter.   Return if symptoms worsen or fail to improve, for As schedueld .  Audria Nine, NP

## 2022-06-20 NOTE — Assessment & Plan Note (Signed)
Pending magnesium and electrolytes.

## 2022-06-20 NOTE — Patient Instructions (Signed)
Nice to see you today I will be in touch with the labs once I have them Follow up with me as scheduled  Lets take 2 weeks off from the Trulicity

## 2022-07-01 ENCOUNTER — Encounter: Payer: Self-pay | Admitting: Nurse Practitioner

## 2022-07-07 ENCOUNTER — Ambulatory Visit (HOSPITAL_COMMUNITY): Payer: BC Managed Care – PPO | Attending: Internal Medicine

## 2022-07-07 DIAGNOSIS — I5032 Chronic diastolic (congestive) heart failure: Secondary | ICD-10-CM | POA: Diagnosis present

## 2022-07-07 LAB — ECHOCARDIOGRAM COMPLETE
Area-P 1/2: 4.06 cm2
MV VTI: 1.74 cm2
S' Lateral: 2.9 cm

## 2022-07-07 MED ORDER — PERFLUTREN LIPID MICROSPHERE
1.0000 mL | INTRAVENOUS | Status: AC | PRN
Start: 2022-07-07 — End: 2022-07-07
  Administered 2022-07-07: 1.5 mL via INTRAVENOUS

## 2022-07-08 NOTE — Progress Notes (Unsigned)
Office Visit    Patient Name: Karen Miller Date of Encounter: 07/09/2022  Primary Care Provider:  Eden Emms, NP Primary Cardiologist:  Parke Poisson, MD  History of Present Illness    Karen Miller is a 62 y.o.-year-old female with a history of CAD, chronic diastolic heart failure, hypertension, hyperlipidemia, reported paroxysmal atrial fibrillation (no documented history), PVCs, type 2 diabetes, and obesity who presents for follow-up related to hypertension and heart failure.   Today: 4/26 - 113/61 p 90 101/58 p93 124/69 122/62 135/63 Echo without source of hypotension.   Off Trulicity  - tremors and shakes gone.  Even at blood pressure is better somewhat hypotensive, she reports being otherwise asymptomatic.  Initial hospitalization was for hypertensive urgency, and she is on multidrug therapy for hypertension.  When she did try to cut back on her carvedilol at the advice of our team, she did notice an elevation in blood pressure and return to her dosing of 12 and half milligrams twice daily.  Denies chest pain or shortness of breath today.  Last visit: She was  hospitalized in May 2023 in the setting of hypertensive urgency, acute diastolic heart failure. She was evaluated at urgent care prior to her hospitalization and was told that she "might be in atrial fibrillation."  EKG from urgent care visit was unavailable for review, therefore, no documented history of atrial fibrillation.  EKG the ED showed sinus tachycardia, no evidence of ischemia, or arrhythmia. Troponin was elevated.  CT of the chest was negative for PE. Cardiology was consulted. Echocardiogram showed EF 65 to 70%, hyperdynamic LV function, severe LVH, indeterminate diastolic parameters, degenerative mitral valve, mild mitral valve regurgitation, mild to moderate mitral stenosis, severe mitral annular calcification. Coronary CT angiogram revealed coronary calcium score 204, 94th percentile for age, race, and  sex-matched controls, minimal plaques in the left main, LAD, and left circumflex arteries, mild plaque in the RCA, mitral annular calcification, and aortic atherosclerosis.  She was given furosemide and started on losartan and HCTZ. Given presence of MAC, blood cultures were obtained however, these were negative. Therefore, TEE was not pursued given low suspicion for endocarditis with negative blood cultures.    Since that time she has had ongoing elevated blood pressure. 12/2021 Her BP was still elevated above goal, with SBP consistently ranging from the 130s-150s. She also reported a sensitivity to light only when shopping at certain stores such as [employer] and [employer]. She also noted a slight increase in bilateral lower extremity edema.  Amlodipine was added to her medication regimen and she was started on Lasix. Since her last visit she has been stable from a cardiac standpoint.  She continues to note sensitivity to light when shopping at [employer] and [employer].  She wonders if this could be related to her cholesterol medication.  She is going to start taking her medication at nighttime to see if this makes a difference.  Her BP has been well-controlled.  She does note ongoing nonpitting bilateral lower extremity edema, appears this is mostly dependent.  06/09/22: Overall feeling well.  Notes that she continues to have symptoms when shopping at [facility].  She describes a tremulousness at times in her hands.  Also notes that while shopping at [facility] she has blurry vision and her legs feel weak.  Her husband wonders if this is related to low blood pressure episodes.  I have encouraged her to stop at the pharmacy at [facility] when she feels poorly and check  her blood pressure with the available BP cuff.  Encourage her to write in with these readings so that we can further adjust therapy as needed.  No chest pain or shortness of breath.  Of note, does have mitral annular calcification with a gradient of  approximately 9 mmHg at a heart rate of 100 on her last echocardiogram May 2023.  Recommended we repeat echo today given normalization of heart rate.  Fasting today and will repeat lipids and obtain LP(a).  I am happy to obtain a vitamin B12 level to exclude this as a cause of some of her symptoms, management will be per primary.  We discussed compression socks in the event her episodes are related to venous pooling given her known lower extremity edema and possible positional hypotension.  She will obtain these later this week.  Past Medical History    Past Medical History:  Diagnosis Date   Diabetes mellitus without complication (HCC)    on meds   Hyperlipidemia    on meds   Hypertension    on meds   Past Surgical History:  Procedure Laterality Date   FACIAL FRACTURE SURGERY  2008   right side of the face completely plated-crushed her facial bones on that side   OTHER SURGICAL HISTORY  2008   right arm fractured in multiple areas-from right elbow down to right wrist plated and screws   UMBILICAL HERNIA REPAIR     has mesh   WISDOM TOOTH EXTRACTION      Allergies  No Known Allergies   Home Medications    Current Outpatient Medications  Medication Sig Dispense Refill   amLODipine (NORVASC) 5 MG tablet Take 1 tablet (5 mg total) by mouth daily. 90 tablet 3   aspirin EC (QC LO-DOSE ASPIRIN) 81 MG tablet TAKE 1 TABLET DAILY - SWALLOW WHOLE 90 tablet 1   atorvastatin (LIPITOR) 40 MG tablet Take 1 tablet (40 mg total) by mouth daily. 30 tablet 6   cholecalciferol (VITAMIN D3) 25 MCG (1000 UNIT) tablet Take 2,000 Units by mouth daily.     cyanocobalamin (VITAMIN B12) 1000 MCG tablet Take 1,000 mcg by mouth daily.     furosemide (LASIX) 20 MG tablet Take 1 tablet (20 mg total) by mouth daily. 90 tablet 3   hydrochlorothiazide (HYDRODIURIL) 25 MG tablet Take 1 tablet (25 mg total) by mouth daily. 90 tablet 0   metFORMIN (GLUCOPHAGE) 500 MG tablet Take 1 tablet (500 mg total) by mouth  daily with breakfast. 90 tablet 3   valsartan (DIOVAN) 320 MG tablet Take 1 tablet (320 mg total) by mouth daily. 90 tablet 3   carvedilol (COREG) 12.5 MG tablet Take 1 tablet (12.5 mg total) by mouth 2 (two) times daily.     No current facility-administered medications for this visit.     Review of Systems    Negative except per HPI  Physical Exam    VS:  BP 136/60 (BP Location: Left Arm, Patient Position: Sitting, Cuff Size: Normal)   Pulse 84   Ht 5\' 4"  (1.626 m)   Wt 236 lb 9.6 oz (107.3 kg)   SpO2 96%   BMI 40.61 kg/m  Constitutional: No acute distress Eyes: sclera non-icteric, normal conjunctiva and lids ENMT: normal dentition, moist mucous membranes Cardiovascular: regular rhythm, normal rate, 1/6 apical diastolic murmur. S1 and S2 normal. No jugular venous distention.  Respiratory: clear to auscultation bilaterally GI : normal bowel sounds, soft and nontender. No distention.   MSK: extremities warm, well  perfused. 1+ puffy edema.  NEURO: grossly nonfocal exam, moves all extremities. PSYCH: alert and oriented x 3, normal mood and affect.    Accessory Clinical Findings    ECG: NSR   Lab Results  Component Value Date   WBC 8.8 06/20/2022   HGB 12.2 06/20/2022   HCT 35.8 (L) 06/20/2022   MCV 90.6 06/20/2022   PLT 340.0 06/20/2022   Lab Results  Component Value Date   CREATININE 1.04 06/20/2022   BUN 31 (H) 06/20/2022   NA 139 06/20/2022   K 4.9 06/20/2022   CL 100 06/20/2022   CO2 30 06/20/2022   Lab Results  Component Value Date   ALT 18 06/20/2022   AST 15 06/20/2022   ALKPHOS 80 06/20/2022   BILITOT 0.4 06/20/2022   Lab Results  Component Value Date   CHOL 125 06/09/2022   HDL 42 06/09/2022   LDLCALC 56 06/09/2022   TRIG 159 (H) 06/09/2022   CHOLHDL 3.0 06/09/2022    Lab Results  Component Value Date   HGBA1C 6.1 (A) 05/01/2022    Assessment & Plan    1. Hypertension: BP well controlled. Continue current antihypertensive regimen.  She  will check her blood pressure when feeling poorly at [facility] and report back.  So far even with slightly lower blood pressure readings, she has not had a recurrence of lightheadedness, dizziness, presyncope. 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   2. CAD: Coronary CT angiogram revealed coronary calcium score 204, 94th percentile for age, race, and sex-matched controls, minimal plaques in the left main, LAD, and left circumflex arteries, mild plaque in the RCA, mitral annular calcification, and aortic atherosclerosis. Stable with no anginal symptoms. Continue aspirin, valsartan, carvedilol, amlodipine, hydrochlorothiazide, and Lipitor.  3. Chronic diastolic heart failure: Echo as above.  She notes ongoing nonpitting bilateral lower extremity edema, appears to be mostly dependent.  BNP was normal at last visit.  Continue Lasix at current dose.   4. Valvular heart disease: Repeat echocardiogram stable, likely repeat in 1 year or with new symptoms.  Continu  5. Reported paroxysmal atrial fibrillation/Sinus tachycardia: No documented evidence of atrial fibrillation.  She is not on anticoagulation. Previously declined cardiac monitor.  HR stable.  Denies any recent palpitations.  Will plan to perform a cardiac monitor if she continues to have symptoms while shopping.   6. Hyperlipidemia: LDL was 56 on June 09, 2022, continue aspirin, Lipitor.   7. Type 2 diabetes/obesity:  A1c was 6.1. Monitored and managed per PCP.  Encouraged ongoing lifestyle modifications with diet and exercise.  She is no longer on Trulicity as this was perhaps the source of her tremors and shaking which have resolved off of this therapy.  Total time of encounter: 30 minutes total time of encounter, including 20 minutes spent in face-to-face patient care on the date of this encounter. This time includes coordination of care and counseling regarding above  mentioned problem list. Remainder of non-face-to-face time involved reviewing chart documents/testing relevant to the patient encounter and documentation in the medical record. I have independently reviewed documentation from referring provider.   Weston Brass, MD, [facility] [place]  [facility]   Meds ordered this encounter  Medications   carvedilol (COREG) 12.5 MG tablet    Sig: Take 1 tablet (12.5 mg total) by mouth 2 (two) times daily.   No orders of the defined types were placed in this encounter.   Patient Instructions  Medication Instructions:  No Changes In Medications at this time.   *If you need a refill on your cardiac medications before your next appointment, please call your pharmacy*  Lab Work: None Ordered At This Time.   If you have labs (blood work) drawn today and your tests are completely normal, you will receive your results only by: MyChart Message (if you have MyChart) OR A paper copy in the mail If you have any lab test that is abnormal or we need to change your treatment, we will call you to review the results.  Testing/Procedures: None Ordered At This Time.    Follow-Up: At [facility], you and your health needs are our priority.  As part of our continuing mission to provide you with exceptional heart care, we have created designated Provider Care Teams.  These Care Teams include your primary Cardiologist (physician) and Advanced Practice Providers (APPs -  Physician Assistants and Nurse Practitioners) who all work together to provide you with the care you need, when you need it.  Your next appointment:   AS SCHEDULED   Provider:   Parke Poisson, MD

## 2022-07-09 ENCOUNTER — Encounter: Payer: Self-pay | Admitting: Internal Medicine

## 2022-07-09 ENCOUNTER — Ambulatory Visit: Payer: BC Managed Care – PPO | Attending: Internal Medicine | Admitting: Internal Medicine

## 2022-07-09 VITALS — BP 136/60 | HR 84 | Ht 64.0 in | Wt 236.6 lb

## 2022-07-09 DIAGNOSIS — I5032 Chronic diastolic (congestive) heart failure: Secondary | ICD-10-CM

## 2022-07-09 DIAGNOSIS — I10 Essential (primary) hypertension: Secondary | ICD-10-CM | POA: Diagnosis not present

## 2022-07-09 DIAGNOSIS — E1169 Type 2 diabetes mellitus with other specified complication: Secondary | ICD-10-CM

## 2022-07-09 DIAGNOSIS — I342 Nonrheumatic mitral (valve) stenosis: Secondary | ICD-10-CM

## 2022-07-09 DIAGNOSIS — I3481 Nonrheumatic mitral (valve) annulus calcification: Secondary | ICD-10-CM

## 2022-07-09 DIAGNOSIS — I2510 Atherosclerotic heart disease of native coronary artery without angina pectoris: Secondary | ICD-10-CM | POA: Diagnosis not present

## 2022-07-09 DIAGNOSIS — I340 Nonrheumatic mitral (valve) insufficiency: Secondary | ICD-10-CM

## 2022-07-09 DIAGNOSIS — Z6841 Body Mass Index (BMI) 40.0 and over, adult: Secondary | ICD-10-CM

## 2022-07-09 DIAGNOSIS — E785 Hyperlipidemia, unspecified: Secondary | ICD-10-CM

## 2022-07-09 DIAGNOSIS — I480 Paroxysmal atrial fibrillation: Secondary | ICD-10-CM

## 2022-07-09 MED ORDER — CARVEDILOL 12.5 MG PO TABS: 12.5000 mg | ORAL_TABLET | Freq: Two times a day (BID) | ORAL | Status: DC

## 2022-07-09 NOTE — Patient Instructions (Signed)
Medication Instructions:  No Changes In Medications at this time.   *If you need a refill on your cardiac medications before your next appointment, please call your pharmacy*  Lab Work: None Ordered At This Time.   If you have labs (blood work) drawn today and your tests are completely normal, you will receive your results only by: MyChart Message (if you have MyChart) OR A paper copy in the mail If you have any lab test that is abnormal or we need to change your treatment, we will call you to review the results.  Testing/Procedures: None Ordered At This Time.    Follow-Up: At [facility], you and your health needs are our priority.  As part of our continuing mission to provide you with exceptional heart care, we have created designated Provider Care Teams.  These Care Teams include your primary Cardiologist (physician) and Advanced Practice Providers (APPs -  Physician Assistants and Nurse Practitioners) who all work together to provide you with the care you need, when you need it.  Your next appointment:   AS SCHEDULED   Provider:   Parke Poisson, MD

## 2022-07-30 ENCOUNTER — Ambulatory Visit: Payer: BC Managed Care – PPO | Admitting: Nurse Practitioner

## 2022-07-31 ENCOUNTER — Ambulatory Visit: Payer: BC Managed Care – PPO | Admitting: Nurse Practitioner

## 2022-07-31 ENCOUNTER — Encounter: Payer: Self-pay | Admitting: Nurse Practitioner

## 2022-07-31 VITALS — BP 118/68 | HR 80 | Temp 97.9°F | Resp 16 | Ht 64.0 in | Wt 263.2 lb

## 2022-07-31 DIAGNOSIS — Z23 Encounter for immunization: Secondary | ICD-10-CM

## 2022-07-31 DIAGNOSIS — E785 Hyperlipidemia, unspecified: Secondary | ICD-10-CM | POA: Diagnosis not present

## 2022-07-31 DIAGNOSIS — E1169 Type 2 diabetes mellitus with other specified complication: Secondary | ICD-10-CM

## 2022-07-31 DIAGNOSIS — Z7984 Long term (current) use of oral hypoglycemic drugs: Secondary | ICD-10-CM | POA: Diagnosis not present

## 2022-07-31 LAB — POCT GLYCOSYLATED HEMOGLOBIN (HGB A1C): Hemoglobin A1C: 6.4 % — AB (ref 4.0–5.6)

## 2022-07-31 NOTE — Assessment & Plan Note (Signed)
Patient currently maintained on metformin 500 mg.  Diet has been poor as of late.  Exercise has been minimal.  Encourage patient to slowly decrease her exercise back up to where she is walking several miles every day.  Patient's A1c 6.4% will continue medications as patient was tried on GLP-1 receptor agonist and had a tremor that resolved once coming off medication.

## 2022-07-31 NOTE — Progress Notes (Signed)
Established Patient Office Visit  Subjective   Patient ID: Karen Miller, female    DOB: 09/10/1960  Age: 62 y.o. MRN: [id number]  Chief Complaint  Patient presents with   Diabetes     DM2: patient is currently maintained on metformin 500mg  daily. We did have patient on trulicity but she developed a tremor. Once we stopped the medication the tremor resolved  States that she is still having issues with her legs and eyes. States that she has checked her bp and it is ok. States that she checked it within the hour it is 90s/50s. States that she has not been walking as late  States that her diet is not good.  States that she does 2 meals a day. States that she will do breakfast and supper 230 in the evening with little snakcing. States that she is eating fresh vegatbale  with the garden coming in     Review of Systems  Constitutional:  Negative for chills and fever.  Respiratory:  Negative for shortness of breath.   Cardiovascular:  Negative for chest pain.  Neurological:  Negative for headaches.      Objective:     BP 118/68   Pulse 80   Temp 97.9 F (36.6 C)   Resp 16   Ht 5\' 4"  (1.626 m)   Wt 263 lb 4 oz (119.4 kg)   SpO2 100%   BMI 45.19 kg/m  BP Readings from Last 3 Encounters:  07/31/22 118/68  07/09/22 136/60  06/20/22 130/72   Wt Readings from Last 3 Encounters:  07/31/22 263 lb 4 oz (119.4 kg)  07/09/22 236 lb 9.6 oz (107.3 kg)  06/20/22 261 lb 2 oz (118.4 kg)      Physical Exam Vitals and nursing note reviewed.  Constitutional:      Appearance: Normal appearance. She is obese.  Cardiovascular:     Rate and Rhythm: Normal rate and regular rhythm.     Heart sounds: Normal heart sounds.  Pulmonary:     Effort: Pulmonary effort is normal.     Breath sounds: Normal breath sounds.  Neurological:     Mental Status: She is alert.      Results for orders placed or performed in visit on 07/31/22  POCT glycosylated hemoglobin (Hb A1C)  Result Value  Ref Range   Hemoglobin A1C 6.4 (A) 4.0 - 5.6 %   HbA1c POC (<> result, manual entry)     HbA1c, POC (prediabetic range)     HbA1c, POC (controlled diabetic range)        The ASCVD Risk score (Arnett DK, et al., 2019) failed to calculate for the following reasons:   The valid total cholesterol range is 130 to 320 mg/dL    Assessment & Plan:   Problem List Items Addressed This Visit       Endocrine   Type 2 diabetes mellitus with hyperlipidemia (HCC) - Primary    Patient currently maintained on metformin 500 mg.  Diet has been poor as of late.  Exercise has been minimal.  Encourage patient to slowly decrease her exercise back up to where she is walking several miles every day.  Patient's A1c 6.4% will continue medications as patient was tried on GLP-1 receptor agonist and had a tremor that resolved once coming off medication.      Relevant Orders   POCT glycosylated hemoglobin (Hb A1C) (Completed)   Other Visit Diagnoses     Need for pneumococcal 20-valent conjugate vaccination  Relevant Orders   Pneumococcal conjugate vaccine 20-valent (Prevnar 20) (Completed)       Return in about 5 months (around 12/31/2022) for CPE and Labs, DM recheck.    Audria Nine, NP

## 2022-07-31 NOTE — Patient Instructions (Signed)
Nice to see you today I want to see you in 5 months for a physical and diabetes check, sooner if you need me

## 2022-08-06 ENCOUNTER — Other Ambulatory Visit: Payer: Self-pay | Admitting: Nurse Practitioner

## 2022-08-06 DIAGNOSIS — E1159 Type 2 diabetes mellitus with other circulatory complications: Secondary | ICD-10-CM

## 2022-08-07 ENCOUNTER — Other Ambulatory Visit: Payer: Self-pay | Admitting: Internal Medicine

## 2022-08-11 ENCOUNTER — Other Ambulatory Visit: Payer: Self-pay

## 2022-08-11 MED ORDER — ATORVASTATIN CALCIUM 40 MG PO TABS: 40.0000 mg | ORAL_TABLET | Freq: Every day | ORAL | 1 refills | Status: DC

## 2022-09-08 ENCOUNTER — Encounter: Payer: Self-pay | Admitting: Nurse Practitioner

## 2022-09-08 ENCOUNTER — Ambulatory Visit: Payer: BC Managed Care – PPO | Admitting: Nurse Practitioner

## 2022-09-08 VITALS — BP 132/74 | HR 54 | Temp 97.8°F | Ht 64.0 in | Wt 265.0 lb

## 2022-09-08 DIAGNOSIS — M79605 Pain in left leg: Secondary | ICD-10-CM

## 2022-09-08 DIAGNOSIS — M7989 Other specified soft tissue disorders: Secondary | ICD-10-CM | POA: Diagnosis not present

## 2022-09-08 LAB — CBC
HCT: 36.2 % (ref 36.0–46.0)
Hemoglobin: 12 g/dL (ref 12.0–15.0)
MCHC: 33.3 g/dL (ref 30.0–36.0)
MCV: 90.7 fl (ref 78.0–100.0)
Platelets: 326 10*3/uL (ref 150.0–400.0)
RBC: 3.99 Mil/uL (ref 3.87–5.11)
RDW: 13.5 % (ref 11.5–15.5)
WBC: 9.8 10*3/uL (ref 4.0–10.5)

## 2022-09-08 NOTE — Assessment & Plan Note (Signed)
Patient with history of heart failure not currently anticoagulated.  Lower risk for blood clot.  Will do a D-dimer if positive consider ultrasound.  Patient has a history of bilateral lower extremity edema gets better with elevation.  Patient is also on amlodipine.  Patient is on 2 diuretics also.  Signs and symptoms reviewed when to be seen emergently

## 2022-09-08 NOTE — Assessment & Plan Note (Signed)
Some bloody discoloration to the left lower anterior leg.  Low suspicion for cellulitis patient will have a watchful eye will check CBC.  Check D-dimer with concern for color change and pain.  Likely some peripheral vascular disease going on pulse palpable cap refill within normal limits bilaterally

## 2022-09-08 NOTE — Progress Notes (Signed)
   Acute Office Visit  Subjective:     Patient ID: Karen Miller, female    DOB: Jun 23, 1960, 62 y.o.   MRN: [id number]  Chief Complaint  Patient presents with   Leg Swelling    Left leg swelling and pain at times     HPI Patient is in today for leg swelling with a history of HTN, CHF, DM2   Started last week. No injury. States that when she lays it on the cough it is tender to touch. No history of blood clots. No recent travel. She is currently on two fluid pills and the swelling will go down at night when she elevates them. Concern for color change and tenderness      Review of Systems  Constitutional:  Negative for chills, fever and weight loss.  Respiratory:  Negative for shortness of breath.   Cardiovascular:  Positive for leg swelling. Negative for chest pain.  Neurological:  Negative for dizziness, tingling and headaches.        Objective:    BP 132/74   Pulse (!) 54   Temp 97.8 F (36.6 C) (Temporal)   Ht 5\' 4"  (1.626 m)   Wt 265 lb (120.2 kg)   SpO2 96%   BMI 45.49 kg/m  BP Readings from Last 3 Encounters:  09/08/22 132/74  07/31/22 118/68  07/09/22 136/60   Wt Readings from Last 3 Encounters:  09/08/22 265 lb (120.2 kg)  07/31/22 263 lb 4 oz (119.4 kg)  07/09/22 236 lb 9.6 oz (107.3 kg)      Physical Exam Cardiovascular:     Pulses:          Dorsalis pedis pulses are 2+ on the right side and 2+ on the left side.       Posterior tibial pulses are 1+ on the left side.  Musculoskeletal:     Right lower leg: Edema present.     Left lower leg: Edema present.     Comments: 44 left calf 46 right calf  Skin:         Comments: Brownish discoloration. No erythema. Calf supple and non tender to palpation      No results found for any visits on 09/08/22.      Assessment & Plan:   Problem List Items Addressed This Visit       Other   Leg swelling - Primary    Patient with history of heart failure not currently anticoagulated.  Lower risk  for blood clot.  Will do a D-dimer if positive consider ultrasound.  Patient has a history of bilateral lower extremity edema gets better with elevation.  Patient is also on amlodipine.  Patient is on 2 diuretics also.  Signs and symptoms reviewed when to be seen emergently      Relevant Orders   CBC   D-dimer, quantitative   Pain of left lower extremity    Some bloody discoloration to the left lower anterior leg.  Low suspicion for cellulitis patient will have a watchful eye will check CBC.  Check D-dimer with concern for color change and pain.  Likely some peripheral vascular disease going on pulse palpable cap refill within normal limits bilaterally      Relevant Orders   CBC   D-dimer, quantitative    No orders of the defined types were placed in this encounter.   Return if symptoms worsen or fail to improve, for As scheduled .  Audria Nine, NP

## 2022-09-08 NOTE — Patient Instructions (Signed)
Nice to see you today If the discoloration spreads I need to know I will be in touch with the labs once I have reviewed them If you have chest pain or shortness of breath be seen immediately

## 2022-09-09 ENCOUNTER — Telehealth: Payer: Self-pay | Admitting: Nurse Practitioner

## 2022-09-09 DIAGNOSIS — M7989 Other specified soft tissue disorders: Secondary | ICD-10-CM

## 2022-09-09 LAB — D-DIMER, QUANTITATIVE: D-Dimer, Quant: <0.19 mcg/mL FEU (ref <0.50)

## 2022-09-09 NOTE — Telephone Encounter (Signed)
Order placed

## 2022-09-09 NOTE — Telephone Encounter (Signed)
-----   Message from [place], [place] sent at 09/09/2022  1:22 PM EDT ----- Called patient reviewed all information and repeated back to me. Will call if any questions.   Patient agreed to having US done. Patient preference is [place] and can only do morning appointments.

## 2022-09-17 ENCOUNTER — Other Ambulatory Visit: Payer: Self-pay | Admitting: Nurse Practitioner

## 2022-10-28 ENCOUNTER — Encounter: Payer: Self-pay | Admitting: *Deleted

## 2022-10-29 ENCOUNTER — Other Ambulatory Visit: Payer: Self-pay | Admitting: Nurse Practitioner

## 2022-10-29 DIAGNOSIS — Z1231 Encounter for screening mammogram for malignant neoplasm of breast: Secondary | ICD-10-CM

## 2022-10-30 ENCOUNTER — Other Ambulatory Visit: Payer: Self-pay | Admitting: Nurse Practitioner

## 2022-10-30 DIAGNOSIS — M79605 Pain in left leg: Secondary | ICD-10-CM

## 2022-10-30 DIAGNOSIS — M7989 Other specified soft tissue disorders: Secondary | ICD-10-CM

## 2022-11-05 ENCOUNTER — Ambulatory Visit (INDEPENDENT_AMBULATORY_CARE_PROVIDER_SITE_OTHER): Payer: Self-pay

## 2022-11-05 DIAGNOSIS — M7989 Other specified soft tissue disorders: Secondary | ICD-10-CM

## 2022-11-05 DIAGNOSIS — M79605 Pain in left leg: Secondary | ICD-10-CM

## 2022-11-12 ENCOUNTER — Encounter: Payer: Self-pay | Admitting: Nurse Practitioner

## 2022-11-12 ENCOUNTER — Ambulatory Visit
Admission: RE | Admit: 2022-11-12 | Discharge: 2022-11-12 | Disposition: A | Discharge status: Home / Self Care | Payer: BC Managed Care – PPO | Source: Ambulatory Visit | Attending: Nurse Practitioner | Admitting: Nurse Practitioner

## 2022-11-12 DIAGNOSIS — Z1231 Encounter for screening mammogram for malignant neoplasm of breast: Secondary | ICD-10-CM | POA: Diagnosis present

## 2022-11-26 ENCOUNTER — Other Ambulatory Visit: Payer: Self-pay | Admitting: Nurse Practitioner

## 2022-12-01 NOTE — Progress Notes (Unsigned)
Office Visit    Patient Name: Karen Miller Date of Encounter: 12/02/2022  Primary Care Provider:  Eden Emms, NP Primary Cardiologist:  Parke Poisson, MD  History of Present Illness    Karen Miller is a 62 y.o.-year-old female with a history of CAD, chronic diastolic heart failure, hypertension, hyperlipidemia, reported paroxysmal atrial fibrillation (no documented history), PVCs, type 2 diabetes, and obesity who presents for follow-up related to hypertension and heart failure.   Discussed the use of AI scribe software for clinical note transcription with the patient, who gave verbal consent to proceed.  History of Present Illness   The patient, with a history of diabetes, hypertension, and high cholesterol, presents with ongoing episodes of dizziness and vision problems. These episodes often occur after eating and are accompanied by a feeling of heat and increased heart rate. The patient reports that these symptoms have persisted despite discontinuation of Trulicity, which was initially stopped due to side effects of tremors. The patient also reports persistent weakness in the legs, which has limited her ability to walk and engage in regular physical activity.  In addition to these symptoms, the patient has noticed discoloration in her legs, which has been evaluated with Doppler ultrasound and found to have normal blood flow. The patient has been using compression socks intermittently and has noticed some improvement. The patient's blood pressure and blood sugar levels have been fluctuating, with morning blood sugars often in the 140-160 range despite not consuming sweet foods or drinks.      Last visit: Off Trulicity  - tremors and shakes gone.  Even at blood pressure is better somewhat hypotensive, she reports being otherwise asymptomatic.  Initial hospitalization was for hypertensive urgency, and she is on multidrug therapy for hypertension.  When she did try to cut back on  her carvedilol at the advice of our team, she did notice an elevation in blood pressure and return to her dosing of 12 and half milligrams twice daily.  Past Medical History    Past Medical History:  Diagnosis Date   Diabetes mellitus without complication (HCC)    on meds   Hyperlipidemia    on meds   Hypertension    on meds   Past Surgical History:  Procedure Laterality Date   FACIAL FRACTURE SURGERY  2008   right side of the face completely plated-crushed her facial bones on that side   OTHER SURGICAL HISTORY  2008   right arm fractured in multiple areas-from right elbow down to right wrist plated and screws   UMBILICAL HERNIA REPAIR     has mesh   WISDOM TOOTH EXTRACTION      Allergies  Allergies  Allergen Reactions   Trulicity [Dulaglutide] Other (See Comments)    tremor     Home Medications    Current Outpatient Medications  Medication Sig Dispense Refill   amLODipine (NORVASC) 5 MG tablet Take 1 tablet (5 mg total) by mouth daily. 90 tablet 3   aspirin EC (QC LO-DOSE ASPIRIN) 81 MG tablet TAKE 1 TABLET DAILY - SWALLOW WHOLE 90 tablet 1   atorvastatin (LIPITOR) 40 MG tablet Take 1 tablet (40 mg total) by mouth daily. 90 tablet 1   carvedilol (COREG) 12.5 MG tablet Take 1 tablet (12.5 mg total) by mouth 2 (two) times daily. 180 tablet 2   cholecalciferol (VITAMIN D3) 25 MCG (1000 UNIT) tablet Take 2,000 Units by mouth daily.     cyanocobalamin (VITAMIN B12) 1000 MCG tablet Take  1,000 mcg by mouth daily.     furosemide (LASIX) 20 MG tablet Take 1 tablet (20 mg total) by mouth daily. 90 tablet 3   hydrochlorothiazide (HYDRODIURIL) 25 MG tablet Take 1 tablet (25 mg total) by mouth daily. 90 tablet 1   metFORMIN (GLUCOPHAGE) 500 MG tablet Take 1 tablet (500 mg total) by mouth daily with breakfast. 90 tablet 3   valsartan (DIOVAN) 320 MG tablet Take 1 tablet (320 mg total) by mouth daily. 90 tablet 0   No current facility-administered medications for this visit.      Review of Systems    Negative except per HPI  Physical Exam    VS:  BP (!) 142/66   Pulse 83   Ht 5\' 4"  (1.626 m)   Wt 271 lb (122.9 kg)   SpO2 99%   BMI 46.52 kg/m  Constitutional: No acute distress Eyes: sclera non-icteric, normal conjunctiva and lids ENMT: normal dentition, moist mucous membranes Cardiovascular: regular rhythm, normal rate, 1/6 apical diastolic murmur. S1 and S2 normal. No jugular venous distention.  Respiratory: clear to auscultation bilaterally GI : normal bowel sounds, soft and nontender. No distention.   MSK: extremities warm, well perfused. NEURO: grossly nonfocal exam, moves all extremities. PSYCH: alert and oriented x 3, normal mood and affect.  Physical Exam   CHEST: Lungs clear. CARDIOVASCULAR: Faint diastolic murmur at apex. SKIN: Venous stasis changes on legs with gray discoloration.       Accessory Clinical Findings    ECG: EKG Interpretation Date/Time:  Tuesday December 02 2022 08:21:02 EDT Ventricular Rate:  83 PR Interval:  152 QRS Duration:  90 QT Interval:  382 QTC Calculation: 448 R Axis:   17  Text Interpretation: Normal sinus rhythm Normal ECG Confirmed by Weston Brass ([id number]) on 12/02/2022 8:43:07 AM   Results   LABS Hemoglobin A1c: 6.4% (07/2022) LDL: 56 mg/dL  RADIOLOGY Leg artery ultrasound: Normal  DIAGNOSTIC Echocardiography: Severe MAC EKG: Normal (12/02/2022)        Lab Results  Component Value Date   WBC 9.8 09/08/2022   HGB 12.0 09/08/2022   HCT 36.2 09/08/2022   MCV 90.7 09/08/2022   PLT 326.0 09/08/2022   Lab Results  Component Value Date   CREATININE 1.04 06/20/2022   BUN 31 (H) 06/20/2022   NA 139 06/20/2022   K 4.9 06/20/2022   CL 100 06/20/2022   CO2 30 06/20/2022   Lab Results  Component Value Date   ALT 18 06/20/2022   AST 15 06/20/2022   ALKPHOS 80 06/20/2022   BILITOT 0.4 06/20/2022   Lab Results  Component Value Date   CHOL 125 06/09/2022   HDL 42 06/09/2022    LDLCALC 56 06/09/2022   TRIG 159 (H) 06/09/2022   CHOLHDL 3.0 06/09/2022    Lab Results  Component Value Date   HGBA1C 6.4 (A) 07/31/2022    Assessment & Plan      1. Essential hypertension   2. Type 2 diabetes mellitus with hyperlipidemia (HCC)   3. Hyperlipidemia LDL goal <70   4. Mitral valve annular calcification     Assessment and Plan    Diabetes Mellitus Morning blood sugars elevated (140-160) despite not consuming sweet foods or drinks. Patient reports feeling better when previously on injectable diabetes medication. Hemoglobin A1c from May was 6.4. -Referral to endocrinology for further management given intolerance to Trulicity. -Consider obtaining a blood glucose monitor to check sugars during symptomatic episodes.  Venous Stasis Patient reports leg weakness and difficulty  walking, with skin changes consistent with venous stasis. No signs of peripheral artery disease on previous Doppler studies. -Continue use of compression socks as tolerated. -Gradually increase physical activity to strengthen legs.  Hyperlipidemia LDL cholesterol well-managed at 56 on Atorvastatin 40mg . Triglycerides could be improved, likely to improve with better diabetes control. -Continue Atorvastatin 40mg .  Hypertension Blood pressure readings variable, with some readings in the 140s. -Continue current management, with expectation that blood pressure will improve with increased physical activity.  Mitral Valve Calcification Faint murmur heard on examination, likely due to calcified mitral valve. No current symptoms attributable to this. -Continue monitoring with echocardiograms as previously planned.  Follow-up in 6 months.        Total time of encounter: 30 minutes total time of encounter, including 20 minutes spent in face-to-face patient care on the date of this encounter. This time includes coordination of care and counseling regarding above mentioned problem list. Remainder of  non-face-to-face time involved reviewing chart documents/testing relevant to the patient encounter and documentation in the medical record. I have independently reviewed documentation from referring provider.   Weston Brass, MD, [facility]  [facility]   No orders of the defined types were placed in this encounter.  Orders Placed This Encounter  Procedures   Ambulatory referral to Endocrinology   EKG 12-Lead    Patient Instructions  Medication Instructions:  Your physician recommends that you continue on your current medications as directed. Please refer to the Current Medication list given to you today.  *If you need a refill on your cardiac medications before your next appointment, please call your pharmacy*   Follow-Up: At [facility], you and your health needs are our priority.  As part of our continuing mission to provide you with exceptional heart care, we have created designated Provider Care Teams.  These Care Teams include your primary Cardiologist (physician) and Advanced Practice Providers (APPs -  Physician Assistants and Nurse Practitioners) who all work together to provide you with the care you need, when you need it.  We recommend signing up for the patient portal called "MyChart".  Sign up information is provided on this After Visit Summary.  MyChart is used to connect with patients for Virtual Visits (Telemedicine).  Patients are able to view lab/test results, encounter notes, upcoming appointments, etc.  Non-urgent messages can be sent to your provider as well.   To learn more about what you can do with MyChart, go to [email or web address].    Your next appointment:   6 month(s)  Provider:   Parke Poisson, MD    Other Instructions Dr. Jacques Navy recommends wearing compression stockings.  Please increase activity levels gradually.  Make sure to check your blood sugar when you're not feeling well.

## 2022-12-02 ENCOUNTER — Ambulatory Visit: Payer: BC Managed Care – PPO | Attending: Internal Medicine | Admitting: Internal Medicine

## 2022-12-02 ENCOUNTER — Encounter: Payer: Self-pay | Admitting: Internal Medicine

## 2022-12-02 VITALS — BP 132/68 | HR 83 | Ht 64.0 in | Wt 271.0 lb

## 2022-12-02 DIAGNOSIS — E1169 Type 2 diabetes mellitus with other specified complication: Secondary | ICD-10-CM | POA: Diagnosis not present

## 2022-12-02 DIAGNOSIS — I10 Essential (primary) hypertension: Secondary | ICD-10-CM

## 2022-12-02 DIAGNOSIS — I3481 Nonrheumatic mitral (valve) annulus calcification: Secondary | ICD-10-CM | POA: Diagnosis not present

## 2022-12-02 DIAGNOSIS — E785 Hyperlipidemia, unspecified: Secondary | ICD-10-CM

## 2022-12-02 DIAGNOSIS — Z7984 Long term (current) use of oral hypoglycemic drugs: Secondary | ICD-10-CM

## 2022-12-02 NOTE — Patient Instructions (Addendum)
Medication Instructions:  Your physician recommends that you continue on your current medications as directed. Please refer to the Current Medication list given to you today.  *If you need a refill on your cardiac medications before your next appointment, please call your pharmacy*   Follow-Up: At [facility], you and your health needs are our priority.  As part of our continuing mission to provide you with exceptional heart care, we have created designated Provider Care Teams.  These Care Teams include your primary Cardiologist (physician) and Advanced Practice Providers (APPs -  Physician Assistants and Nurse Practitioners) who all work together to provide you with the care you need, when you need it.  We recommend signing up for the patient portal called "MyChart".  Sign up information is provided on this After Visit Summary.  MyChart is used to connect with patients for Virtual Visits (Telemedicine).  Patients are able to view lab/test results, encounter notes, upcoming appointments, etc.  Non-urgent messages can be sent to your provider as well.   To learn more about what you can do with MyChart, go to [email or web address].    Your next appointment:   6 month(s)  Provider:   Parke Poisson, MD    Other Instructions Dr. Jacques Navy recommends wearing compression stockings.  Please increase activity levels gradually.  Make sure to check your blood sugar when you're not feeling well.

## 2022-12-15 ENCOUNTER — Other Ambulatory Visit: Payer: Self-pay | Admitting: Nurse Practitioner

## 2022-12-23 ENCOUNTER — Other Ambulatory Visit: Payer: Self-pay | Admitting: Nurse Practitioner

## 2022-12-31 ENCOUNTER — Encounter: Payer: Self-pay | Admitting: Nurse Practitioner

## 2022-12-31 ENCOUNTER — Ambulatory Visit: Payer: BC Managed Care – PPO | Admitting: Nurse Practitioner

## 2022-12-31 VITALS — BP 138/72 | HR 79 | Temp 97.8°F | Ht 65.05 in | Wt 271.8 lb

## 2022-12-31 DIAGNOSIS — Z0000 Encounter for general adult medical examination without abnormal findings: Secondary | ICD-10-CM | POA: Diagnosis not present

## 2022-12-31 DIAGNOSIS — E559 Vitamin D deficiency, unspecified: Secondary | ICD-10-CM

## 2022-12-31 DIAGNOSIS — Z7984 Long term (current) use of oral hypoglycemic drugs: Secondary | ICD-10-CM | POA: Diagnosis not present

## 2022-12-31 DIAGNOSIS — Z23 Encounter for immunization: Secondary | ICD-10-CM

## 2022-12-31 DIAGNOSIS — E785 Hyperlipidemia, unspecified: Secondary | ICD-10-CM

## 2022-12-31 DIAGNOSIS — E1169 Type 2 diabetes mellitus with other specified complication: Secondary | ICD-10-CM

## 2022-12-31 DIAGNOSIS — E1159 Type 2 diabetes mellitus with other circulatory complications: Secondary | ICD-10-CM

## 2022-12-31 DIAGNOSIS — Z1159 Encounter for screening for other viral diseases: Secondary | ICD-10-CM

## 2022-12-31 DIAGNOSIS — I152 Hypertension secondary to endocrine disorders: Secondary | ICD-10-CM

## 2022-12-31 LAB — CBC
HCT: 37.8 % (ref 36.0–46.0)
Hemoglobin: 12.3 g/dL (ref 12.0–15.0)
MCHC: 32.5 g/dL (ref 30.0–36.0)
MCV: 91.3 fL (ref 78.0–100.0)
Platelets: 328 10*3/uL (ref 150.0–400.0)
RBC: 4.14 Mil/uL (ref 3.87–5.11)
RDW: 13.8 % (ref 11.5–15.5)
WBC: 8.4 10*3/uL (ref 4.0–10.5)

## 2022-12-31 LAB — POCT GLYCOSYLATED HEMOGLOBIN (HGB A1C): Hemoglobin A1C: 6.9 % — AB (ref 4.0–5.6)

## 2022-12-31 LAB — COMPREHENSIVE METABOLIC PANEL
ALT: 14 U/L (ref 0–35)
AST: 13 U/L (ref 0–37)
Albumin: 4.3 g/dL (ref 3.5–5.2)
Alkaline Phosphatase: 75 U/L (ref 39–117)
BUN: 28 mg/dL — ABNORMAL HIGH (ref 6–23)
CO2: 31 meq/L (ref 19–32)
Calcium: 9.7 mg/dL (ref 8.4–10.5)
Chloride: 98 meq/L (ref 96–112)
Creatinine, Ser: 0.93 mg/dL (ref 0.40–1.20)
GFR: 65.98 mL/min (ref >60.00)
Glucose, Bld: 120 mg/dL — ABNORMAL HIGH (ref 70–99)
Potassium: 4.6 meq/L (ref 3.5–5.1)
Sodium: 137 meq/L (ref 135–145)
Total Bilirubin: 0.6 mg/dL (ref 0.2–1.2)
Total Protein: 7.5 g/dL (ref 6.0–8.3)

## 2022-12-31 LAB — LIPID PANEL
Cholesterol: 127 mg/dL (ref 0–200)
HDL: 41 mg/dL (ref >39.00)
LDL Cholesterol: 51 mg/dL (ref 0–99)
NonHDL: 85.88
Total CHOL/HDL Ratio: 3
Triglycerides: 172 mg/dL — ABNORMAL HIGH (ref 0.0–149.0)
VLDL: 34.4 mg/dL (ref 0.0–40.0)

## 2022-12-31 LAB — MICROALBUMIN / CREATININE URINE RATIO
Creatinine,U: 44 mg/dL
Microalb Creat Ratio: 2.8 mg/g (ref 0.0–30.0)
Microalb, Ur: 1.3 mg/dL (ref 0.0–1.9)

## 2022-12-31 LAB — TSH: TSH: 2.54 u[IU]/mL (ref 0.35–5.50)

## 2022-12-31 LAB — VITAMIN D 25 HYDROXY (VIT D DEFICIENCY, FRACTURES): VITD: 31.01 ng/mL (ref 30.00–100.00)

## 2022-12-31 MED ORDER — OZEMPIC (0.25 OR 0.5 MG/DOSE) 2 MG/3ML ~~LOC~~ SOPN: 0.2500 mg | PEN_INJECTOR | SUBCUTANEOUS | 0 refills | Status: DC

## 2022-12-31 NOTE — Patient Instructions (Signed)
Nice to see you today Follow up with me in 4 months, sooner if you need me We did update your flu vaccine today

## 2022-12-31 NOTE — Assessment & Plan Note (Signed)
Pending TSH and lipid panel today.  Continue working lifestyle modifications.  Will start a GLP-1 receptor agonist for diabetes today and weight loss

## 2022-12-31 NOTE — Assessment & Plan Note (Signed)
History of the same pending vitamin D level today

## 2022-12-31 NOTE — Progress Notes (Signed)
Established Patient Office Visit  Subjective   Patient ID: Karen Miller, female    DOB: 06-01-1960  Age: 62 y.o. MRN: [id number]  Chief Complaint  Patient presents with   Annual Exam    Yes to flu     HPI  DM2 patient currently maintained on metformin 500 mg daily States that she is checking in the morning and it was 130s. Patient's cardiologist referred patient over to endocrinology  States that she checks her sugar when her sugar after she eats. Statess that her sugar was 200 plus 300s.   HTN: Patient currently maintained on amlodipine, carvedilol, hydrochlorothiazide, valsartan, furosemide.  Blood pressure controlled currently followed by cardiology  HLD: Patient currently maintained on atorvastatin 40 mg.  for complete physical and follow up of chronic conditions.  Immunizations: -Tetanus: Completed in 2024 -Influenza: update today  -Shingles: discussed with the patinet  -Pneumonia: Completed   Diet: Fair diet. She will do breakfast and will eat in the evening and sometimes she will snack. She will drink coffee in the am and will do water the rest of the day  Exercise: No regular exercise. She has an eliptical that she will do 5 days a week for an 1 hour  Eye exam: Completes annually. Wears glasses  Dental exam: Needs updating  Colonoscopy: Completed in 01/09/2022, repeat in 3 years  Lung Cancer Screening: N/A  Pap smear: patient is overdue and defers patient with get done not by primary care offered to refer to GYN patient would like to get her eyes checked first  Mammogram: 11/12/2022  Sleep: goes to bed 5pm and up at 1230-1am. Feels rested        Review of Systems  Constitutional:  Negative for chills and fever.  Respiratory:  Negative for shortness of breath.   Cardiovascular:  Negative for chest pain and leg swelling.  Gastrointestinal:  Negative for abdominal pain, blood in stool, constipation, diarrhea, nausea and vomiting.       Bm daily to  every other day   Genitourinary:  Negative for dysuria and hematuria.  Neurological:  Negative for tingling and headaches.  Psychiatric/Behavioral:  Negative for hallucinations and suicidal ideas.       Objective:     BP 138/72   Pulse 79   Temp 97.8 F (36.6 C) (Oral)   Ht 5' 5.05" (1.652 m)   Wt 271 lb 12.8 oz (123.3 kg)   SpO2 97%   BMI 45.16 kg/m  BP Readings from Last 3 Encounters:  12/31/22 138/72  12/02/22 132/68  09/08/22 132/74   Wt Readings from Last 3 Encounters:  12/31/22 271 lb 12.8 oz (123.3 kg)  12/02/22 271 lb (122.9 kg)  09/08/22 265 lb (120.2 kg)   SpO2 Readings from Last 3 Encounters:  12/31/22 97%  12/02/22 99%  09/08/22 96%      Physical Exam Vitals and nursing note reviewed.  Constitutional:      Appearance: Normal appearance.  HENT:     Right Ear: Tympanic membrane, ear canal and external ear normal.     Left Ear: Tympanic membrane, ear canal and external ear normal.     Mouth/Throat:     Mouth: Mucous membranes are moist.     Pharynx: Oropharynx is clear.  Eyes:     Extraocular Movements: Extraocular movements intact.     Pupils: Pupils are equal, round, and reactive to light.  Cardiovascular:     Rate and Rhythm: Normal rate and regular rhythm.  Pulses: Normal pulses.     Heart sounds: Normal heart sounds.  Pulmonary:     Effort: Pulmonary effort is normal.     Breath sounds: Normal breath sounds.  Abdominal:     General: Bowel sounds are normal. There is no distension.     Palpations: There is no mass.     Tenderness: There is no abdominal tenderness.     Hernia: No hernia is present.  Musculoskeletal:     Right lower leg: No edema.     Left lower leg: No edema.  Lymphadenopathy:     Cervical: No cervical adenopathy.  Skin:    General: Skin is warm.  Neurological:     General: No focal deficit present.     Mental Status: She is alert.     Deep Tendon Reflexes:     Reflex Scores:      Bicep reflexes are 2+ on the  right side and 2+ on the left side.      Patellar reflexes are 2+ on the right side and 2+ on the left side.    Comments: Bilateral upper and lower extremity strength 5/5  Psychiatric:        Mood and Affect: Mood normal.        Behavior: Behavior normal.        Thought Content: Thought content normal.        Judgment: Judgment normal.    Title   Diabetic Foot Exam - detailed Is there a history of foot ulcer?: No Is there a foot ulcer now?: No Is there swelling?: No Is there elevated skin temperature?: No Is there abnormal foot shape?: No Is there a claw toe deformity?: No Are the toenails long?: No Are the toenails thick?: No Are the toenails ingrown?: No Pulse Foot Exam completed.: Yes   Right Dorsalis Pedis: Present Left Dorsalis Pedis: Present     Sensory Foot Exam Completed.: Yes Semmes-Weinstein Monofilament Test "+" means "has sensation" and "-" means "no sensation"      Image components are not supported.   Image components are not supported. Image components are not supported.  Tuning Fork Comments Absent 9 on bilateral foot. All other sites are intact       Results for orders placed or performed in visit on 12/31/22  POCT glycosylated hemoglobin (Hb A1C)  Result Value Ref Range   Hemoglobin A1C 6.9 (A) 4.0 - 5.6 %   HbA1c POC (<> result, manual entry)     HbA1c, POC (prediabetic range)     HbA1c, POC (controlled diabetic range)        The ASCVD Risk score (Arnett DK, et al., 2019) failed to calculate for the following reasons:   The valid total cholesterol range is 130 to 320 mg/dL    Assessment & Plan:   Problem List Items Addressed This Visit       Cardiovascular and Mediastinum   Hypertension associated with diabetes (HCC)    Patient currently maintained on valsartan, hydrochlorothiazide, furosemide, carvedilol, amlodipine.  Blood pressure well-controlled.  Continue taking medication as prescribed follow-up with cardiology as recommended       Relevant Medications   Semaglutide,0.25 or 0.5MG /DOS, (OZEMPIC, 0.25 OR 0.5 MG/DOSE,) 2 MG/3ML SOPN     Endocrine   Type 2 diabetes mellitus with hyperlipidemia (HCC)    Patient currently maintained on metformin with an increase in A1c.  She has tried Trulicity in the past.  Tremors she would like to try different injectable we will  start Ozempic 0.25 mg once a week for 4 weeks then titrate up to 0.5 mg once a week thereafter she sees me in office again.  Plan to relinquish prescriptions over to endocrinology once she is established      Relevant Medications   Semaglutide,0.25 or 0.5MG /DOS, (OZEMPIC, 0.25 OR 0.5 MG/DOSE,) 2 MG/3ML SOPN   Other Relevant Orders   Lipid panel   Microalbumin / creatinine urine ratio   POCT glycosylated hemoglobin (Hb A1C) (Completed)     Other   Morbid obesity (HCC)    Pending TSH and lipid panel today.  Continue working lifestyle modifications.  Will start a GLP-1 receptor agonist for diabetes today and weight loss      Relevant Medications   Semaglutide,0.25 or 0.5MG /DOS, (OZEMPIC, 0.25 OR 0.5 MG/DOSE,) 2 MG/3ML SOPN   Other Relevant Orders   VITAMIN D 25 Hydroxy (Vit-D Deficiency, Fractures)   Preventative health care - Primary    Discussed age-appropriate immunizations and screening exams.  Reviewed patient's personal, surgical, social, family histories.  Patient is up-to-date on all age-appropriate vaccinations she would like.  Update flu vaccine today.  Discussed shingles vaccine in office.  Patient is up-to-date on CRC screening or breast cancer screening.  Patient is overdue for cervical cancer screening.  Patient would like to defer currently she would not like me to do this did offer to refer to GYN but she would like to get her eyes fixed first.  Patient was given information at discharge about preventative healthcare maintenance with anticipatory guidance      Relevant Orders   CBC   Comprehensive metabolic panel   TSH   Vitamin D  deficiency    History of the same pending vitamin D level today      Other Visit Diagnoses     Encounter for hepatitis C screening test for low risk patient       Relevant Orders   Hepatitis C Antibody   Need for influenza vaccination       Relevant Orders   Flu vaccine trivalent PF, 6mos and older(Flulaval,Afluria,Fluarix,Fluzone) (Completed)       Return in about 4 months (around 05/03/2023) for DM recheck.    Audria Nine, NP

## 2022-12-31 NOTE — Assessment & Plan Note (Signed)
Patient currently maintained on metformin with an increase in A1c.  She has tried Trulicity in the past.  Tremors she would like to try different injectable we will start Ozempic 0.25 mg once a week for 4 weeks then titrate up to 0.5 mg once a week thereafter she sees me in office again.  Plan to relinquish prescriptions over to endocrinology once she is established

## 2022-12-31 NOTE — Assessment & Plan Note (Signed)
Patient currently maintained on valsartan, hydrochlorothiazide, furosemide, carvedilol, amlodipine.  Blood pressure well-controlled.  Continue taking medication as prescribed follow-up with cardiology as recommended

## 2022-12-31 NOTE — Assessment & Plan Note (Signed)
Discussed age-appropriate immunizations and screening exams.  Reviewed patient's personal, surgical, social, family histories.  Patient is up-to-date on all age-appropriate vaccinations she would like.  Update flu vaccine today.  Discussed shingles vaccine in office.  Patient is up-to-date on CRC screening or breast cancer screening.  Patient is overdue for cervical cancer screening.  Patient would like to defer currently she would not like me to do this did offer to refer to GYN but she would like to get her eyes fixed first.  Patient was given information at discharge about preventative healthcare maintenance with anticipatory guidance

## 2023-01-01 ENCOUNTER — Telehealth: Payer: Self-pay

## 2023-01-01 ENCOUNTER — Encounter: Payer: Self-pay | Admitting: Nurse Practitioner

## 2023-01-01 ENCOUNTER — Other Ambulatory Visit (HOSPITAL_COMMUNITY): Payer: Self-pay

## 2023-01-01 LAB — HEPATITIS C ANTIBODY: Hepatitis C Ab: NONREACTIVE

## 2023-01-01 NOTE — Telephone Encounter (Signed)
Pharmacy Patient Advocate Encounter  Insurance verification completed.    The patient is insured through [facility]   Ran test claim for Ozempic. Currently a quantity of 3ml is a 28 day supply and the co-pay is 24.99 . At this time ''No Prior Authorization is required''    This test claim was processed through [facility]- copay amounts may vary at other pharmacies due to pharmacy/plan contracts, or as the patient moves through the different stages of their insurance plan.

## 2023-01-02 ENCOUNTER — Telehealth: Payer: Self-pay | Admitting: Nurse Practitioner

## 2023-01-02 NOTE — Telephone Encounter (Signed)
Can we call the pharmacy and see if it is a PA. If so can we see if they sent it to [place]

## 2023-01-02 NOTE — Telephone Encounter (Signed)
-----   Message from Parke Poisson sent at 01/02/2023  9:04 AM EDT ----- Thanks! Given her persistent symptoms without a cardiac origin, I sent her over to endocrine, hope that's ok!  [place] ----- Message ----- From: Eden Emms, NP Sent: 01/02/2023   7:36 AM EDT To: Parke Poisson, MD  Notified via My Chart   Dr. Jacques Navy,  Patient mentioned you wanted to see the labs once resulted

## 2023-01-02 NOTE — Telephone Encounter (Signed)
She informed me. Thank you

## 2023-01-05 NOTE — Telephone Encounter (Signed)
Called pharmacy they are having system issues. Requested we call back later.

## 2023-01-07 ENCOUNTER — Encounter: Payer: Self-pay | Admitting: Nurse Practitioner

## 2023-01-07 ENCOUNTER — Other Ambulatory Visit (HOSPITAL_COMMUNITY): Payer: Self-pay

## 2023-01-07 NOTE — Telephone Encounter (Signed)
Patients pharmacy benefits are through [employer] as stated on patients insurance card

## 2023-01-07 NOTE — Telephone Encounter (Signed)
Contacted drug store. Pharmacy staff stated that Ozempic went through (approved) and pt should be able to pick up their prescription.

## 2023-01-07 NOTE — Telephone Encounter (Addendum)
Received paid claim from [facility], but pharmacy still requiring PA.      The patient is insured through [employer] .   Per test claim: PA required; PA submitted to above mentioned insurance via CoverMyMeds Key/confirmation #/EOC [id number] Status is pending

## 2023-01-08 ENCOUNTER — Other Ambulatory Visit (HOSPITAL_COMMUNITY): Payer: Self-pay

## 2023-01-08 NOTE — Telephone Encounter (Signed)
Pharmacy Patient Advocate Encounter  Received notification from [facility] that Prior Authorization for Ozempic (0.25 or 0.5 MG/DOSE) 2MG /3ML pen-injectors has been APPROVED from 01/07/23 to 01/06/26   PA #/Case ID/Reference #: [id number]

## 2023-02-06 ENCOUNTER — Other Ambulatory Visit: Payer: Self-pay | Admitting: Nurse Practitioner

## 2023-02-06 DIAGNOSIS — E1159 Type 2 diabetes mellitus with other circulatory complications: Secondary | ICD-10-CM

## 2023-02-09 ENCOUNTER — Other Ambulatory Visit: Payer: Self-pay | Admitting: Internal Medicine

## 2023-02-09 NOTE — Telephone Encounter (Signed)
Patient has follow up scheduled as requested at last visit for  (around 05/03/2023) for DM recheck.

## 2023-02-10 ENCOUNTER — Encounter: Payer: Self-pay | Admitting: Internal Medicine

## 2023-02-10 ENCOUNTER — Other Ambulatory Visit: Payer: Self-pay | Admitting: Internal Medicine

## 2023-02-10 MED ORDER — ATORVASTATIN CALCIUM 40 MG PO TABS: 40.0000 mg | ORAL_TABLET | Freq: Every day | ORAL | 2 refills | Status: DC

## 2023-02-16 ENCOUNTER — Other Ambulatory Visit: Payer: Self-pay | Admitting: Nurse Practitioner

## 2023-02-16 DIAGNOSIS — E1169 Type 2 diabetes mellitus with other specified complication: Secondary | ICD-10-CM

## 2023-02-19 ENCOUNTER — Other Ambulatory Visit: Payer: Self-pay

## 2023-02-19 DIAGNOSIS — E785 Hyperlipidemia, unspecified: Secondary | ICD-10-CM

## 2023-02-27 ENCOUNTER — Other Ambulatory Visit: Payer: BC Managed Care – PPO

## 2023-02-28 LAB — COMPREHENSIVE METABOLIC PANEL
AG Ratio: 1.5 (calc) (ref 1.0–2.5)
ALT: 15 U/L (ref 6–29)
AST: 14 U/L (ref 10–35)
Albumin: 4.4 g/dL (ref 3.6–5.1)
Alkaline phosphatase (APISO): 73 U/L (ref 37–153)
BUN: 24 mg/dL (ref 7–25)
CO2: 27 mmol/L (ref 20–32)
Calcium: 9.7 mg/dL (ref 8.6–10.4)
Chloride: 99 mmol/L (ref 98–110)
Creat: 0.89 mg/dL (ref 0.50–1.05)
Globulin: 3 g/dL (ref 1.9–3.7)
Glucose, Bld: 93 mg/dL (ref 65–99)
Potassium: 4.8 mmol/L (ref 3.5–5.3)
Sodium: 137 mmol/L (ref 135–146)
Total Bilirubin: 0.5 mg/dL (ref 0.2–1.2)
Total Protein: 7.4 g/dL (ref 6.1–8.1)

## 2023-02-28 LAB — LIPID PANEL
Cholesterol: 119 mg/dL (ref <200)
HDL: 47 mg/dL — ABNORMAL LOW (ref >=50)
LDL Cholesterol (Calc): 48 mg/dL
Non-HDL Cholesterol (Calc): 72 mg/dL (ref <130)
Total CHOL/HDL Ratio: 2.5 (calc) (ref <5.0)
Triglycerides: 158 mg/dL — ABNORMAL HIGH (ref <150)

## 2023-02-28 LAB — HEMOGLOBIN A1C
Hgb A1c MFr Bld: 6.9 %{Hb} — ABNORMAL HIGH (ref <5.7)
Mean Plasma Glucose: 151 mg/dL
eAG (mmol/L): 8.4 mmol/L

## 2023-02-28 LAB — MICROALBUMIN / CREATININE URINE RATIO
Creatinine, Urine: 65 mg/dL (ref 20–275)
Microalb Creat Ratio: 62 mg/g{creat} — ABNORMAL HIGH (ref <30)
Microalb, Ur: 4 mg/dL

## 2023-03-06 ENCOUNTER — Ambulatory Visit: Payer: BC Managed Care – PPO | Admitting: "Endocrinology

## 2023-03-06 ENCOUNTER — Encounter: Payer: Self-pay | Admitting: "Endocrinology

## 2023-03-06 VITALS — BP 128/70 | HR 91 | Resp 16 | Ht 65.0 in | Wt 266.0 lb

## 2023-03-06 DIAGNOSIS — E782 Mixed hyperlipidemia: Secondary | ICD-10-CM

## 2023-03-06 DIAGNOSIS — Z7984 Long term (current) use of oral hypoglycemic drugs: Secondary | ICD-10-CM

## 2023-03-06 DIAGNOSIS — Z7985 Long-term (current) use of injectable non-insulin antidiabetic drugs: Secondary | ICD-10-CM

## 2023-03-06 DIAGNOSIS — E1165 Type 2 diabetes mellitus with hyperglycemia: Secondary | ICD-10-CM | POA: Diagnosis not present

## 2023-03-06 MED ORDER — SEMAGLUTIDE (1 MG/DOSE) 4 MG/3ML ~~LOC~~ SOPN: 1.0000 mg | PEN_INJECTOR | SUBCUTANEOUS | 0 refills | Status: DC

## 2023-03-06 MED ORDER — DEXCOM G7 SENSOR MISC: 1.0000 | 1 refills | Status: DC

## 2023-03-06 NOTE — Progress Notes (Signed)
Outpatient Endocrinology Note Karen Hendrum, MD  03/06/23   Karen Miller Nov 15, 1960 [id number]  Referring Provider: Parke Poisson, MD Primary Care Provider: Eden Emms, NP Reason for consultation: Subjective   Assessment & Plan  Diagnoses and all orders for this visit:  Uncontrolled type 2 diabetes mellitus with hyperglycemia (HCC)  Long term (current) use of oral hypoglycemic drugs  Long-term (current) use of injectable non-insulin antidiabetic drugs  Mixed hypercholesterolemia and hypertriglyceridemia  Other orders -     Semaglutide, 1 MG/DOSE, 4 MG/3ML SOPN; Inject 1 mg as directed once a week. -     Continuous Glucose Sensor (DEXCOM G7 SENSOR) MISC; 1 Device by Does not apply route continuous.    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,  Lab Results  Component Value Date   GFR 65.98 12/31/2022   Hba1c goal less than 7, current Hba1c is  Lab Results  Component Value Date   HGBA1C 6.9 (H) 02/27/2023   Will recommend the following: Metformin 500mg  every day Ozempic 0.5 mg/week ->in 2 weeks increase to 1 mg/week Ordered DexCom  No known contraindications/side effects to any of above medications No history of MEN syndrome/medullary thyroid cancer/pancreatitis or pancreatic cancer in self or family  -Last LD and Tg are as follows: Lab Results  Component Value Date   LDLCALC 48 02/27/2023    Lab Results  Component Value Date   TRIG 158 (H) 02/27/2023   -On atorvastatin 40 mg QD -Follow low fat diet and exercise   -Blood pressure goal <140/90 - Microalbumin/creatinine goal is < 30 -Last MA/Cr is as follows: Lab Results  Component Value Date   MICROALBUR 4.0 02/27/2023   -on ACE/ARB Valsartan 320 mg/day -diet changes including salt restriction -limit eating outside -counseled BP targets per standards of diabetes care -uncontrolled blood pressure can lead to retinopathy, nephropathy and cardiovascular and atherosclerotic  heart disease  Reviewed and counseled on: -A1C target -Blood sugar targets -Complications of uncontrolled diabetes  -Checking blood sugar before meals and bedtime and bring log next visit -All medications with mechanism of action and side effects -Hypoglycemia management: rule of 15's, Glucagon Emergency Kit and medical alert ID -low-carb low-fat plate-method diet -At least 20 minutes of physical activity per day -Annual dilated retinal eye exam and foot exam -compliance and follow up needs -follow up as scheduled or earlier if problem gets worse  Call if blood sugar is less than 70 or consistently above 250    Take a 15 gm snack of carbohydrate at bedtime before you go to sleep if your blood sugar is less than 100.    If you are going to fast after midnight for a test or procedure, ask your physician for instructions on how to reduce/decrease your insulin dose.    Call if blood sugar is less than 70 or consistently above 250  -Treating a low sugar by rule of 15  (15 gms of sugar every 15 min until sugar is more than 70) If you feel your sugar is low, test your sugar to be sure If your sugar is low (less than 70), then take 15 grams of a fast acting Carbohydrate (3-4 glucose tablets or glucose gel or 4 ounces of juice or regular soda) Recheck your sugar 15 min after treating low to make sure it is more than 70 If sugar is still less than 70, treat again with 15 grams of carbohydrate          Don't drive the hour  of hypoglycemia  If unconscious/unable to eat or drink by mouth, use glucagon injection or nasal spray baqsimi and call 911. Can repeat again in 15 min if still unconscious.  Return in about 4 weeks (around 04/03/2023).   I have reviewed current medications, nurse's notes, allergies, vital signs, past medical and surgical history, family medical history, and social history for this encounter. Counseled patient on symptoms, examination findings, lab findings, imaging results,  treatment decisions and monitoring and prognosis. The patient understood the recommendations and agrees with the treatment plan. All questions regarding treatment plan were fully answered.  Karen Pangburn, MD  03/06/23    History of Present Illness Karen Miller is a 62 y.o. year old female who presents for evaluation of Type II diabetes mellitus.  Karen Miller was first diagnosed in 2022.   Diabetes education +  Home diabetes regimen: Metformin 500mg  every day Ozempic 0.5mg /week since 2 wks  COMPLICATIONS -  MI/Stroke -  retinopathy -  neuropathy + nephropathy/microalbuminuria  SYMPTOMS REVIEWED - Polyuria - Weight loss - Blurred vision  BLOOD SUGAR DATA Checks at 2:30am 110-115  Physical Exam  BP 128/70   Pulse 91   Resp 16   Ht 5\' 5"  (1.651 m)   Wt 266 lb (120.7 kg)   SpO2 99%   BMI 44.26 kg/m    Constitutional: well developed, well nourished Head: normocephalic, atraumatic Eyes: sclera anicteric, no redness Neck: supple Lungs: normal respiratory effort Neurology: alert and oriented Skin: dry, no appreciable rashes Musculoskeletal: no appreciable defects Psychiatric: normal mood and affect Diabetic Foot Exam - Simple   No data filed      Current Medications Patient's Medications  New Prescriptions   CONTINUOUS GLUCOSE SENSOR (DEXCOM G7 SENSOR) MISC    1 Device by Does not apply route continuous.   SEMAGLUTIDE, 1 MG/DOSE, 4 MG/3ML SOPN    Inject 1 mg as directed once a week.  Previous Medications   AMLODIPINE (NORVASC) 5 MG TABLET    Take 1 tablet (5 mg total) by mouth daily.   ASPIRIN EC (QC LO-DOSE ASPIRIN) 81 MG TABLET    TAKE 1 TABLET DAILY - SWALLOW WHOLE   ATORVASTATIN (LIPITOR) 40 MG TABLET    Take 1 tablet (40 mg total) by mouth daily.   CARVEDILOL (COREG) 12.5 MG TABLET    Take 1 tablet (12.5 mg total) by mouth 2 (two) times daily.   CHOLECALCIFEROL (VITAMIN D3) 25 MCG (1000 UNIT) TABLET    Take 2,000 Units by mouth daily.    CYANOCOBALAMIN (VITAMIN B12) 1000 MCG TABLET    Take 1,000 mcg by mouth daily.   FUROSEMIDE (LASIX) 20 MG TABLET    Take 1 tablet (20 mg total) by mouth daily.   HYDROCHLOROTHIAZIDE (HYDRODIURIL) 25 MG TABLET    Take 1 tablet (25 mg total) by mouth daily.   METFORMIN (GLUCOPHAGE) 500 MG TABLET    Take 1 tablet (500 mg total) by mouth daily with breakfast.   VALSARTAN (DIOVAN) 320 MG TABLET    Take 1 tablet (320 mg total) by mouth daily.  Modified Medications   No medications on file  Discontinued Medications   SEMAGLUTIDE,0.25 OR 0.5MG /DOS, (OZEMPIC, 0.25 OR 0.5 MG/DOSE,) 2 MG/3ML SOPN    Inject 0.5 mg into the skin once a week.    Allergies Allergies  Allergen Reactions   Trulicity [Dulaglutide] Other (See Comments)    tremor    Past Medical History Past Medical History:  Diagnosis Date   Diabetes mellitus without  complication (HCC)    on meds   Hyperlipidemia    on meds   Hypertension    on meds    Past Surgical History Past Surgical History:  Procedure Laterality Date   FACIAL FRACTURE SURGERY  2008   right side of the face completely plated-crushed her facial bones on that side   OTHER SURGICAL HISTORY  2008   right arm fractured in multiple areas-from right elbow down to right wrist plated and screws   UMBILICAL HERNIA REPAIR     has mesh   WISDOM TOOTH EXTRACTION      Family History family history includes Breast cancer (age of onset: 39) in her sister; Diabetes in her brother, mother, and sister; Heart attack in her sister; Hypertension in her sister; Other in her brother; Thyroid disease in her sister and sister.  Social History Social History   Socioeconomic History   Marital status: Married    Spouse name: Not on file   Number of children: 2   Years of education: Not on file   Highest education level: Not on file  Occupational History   Not on file  Tobacco Use   Smoking status: Never   Smokeless tobacco: Never  Vaping Use   Vaping status: Never  Used  Substance and Sexual Activity   Alcohol use: Never   Drug use: Never   Sexual activity: Not on file  Other Topics Concern   Not on file  Social History Narrative   Retired: worked for the state for 39      Consider rotating when you check your blood glucose (when you wake, 2 hours after breakfast or 2 hours after dinner)   Blood glucose goals:        Fasting:  80-130        2 hours after the start of a meal:  Less than 180        If your post meal reading is higher than 180, identify what you ate or other things that could have contributed              Social Drivers of Health   Financial Resource Strain: Patient Declined (06/20/2022)   Overall Financial Resource Strain (CARDIA)    Difficulty of Paying Living Expenses: Patient declined  Food Insecurity: Unknown (06/20/2022)   Hunger Vital Sign    Worried About Running Out of Food in the Last Year: Never true    Ran Out of Food in the Last Year: Patient declined  Transportation Needs: No Transportation Needs (06/20/2022)   PRAPARE - Administrator, Civil Service (Medical): No    Lack of Transportation (Non-Medical): No  Physical Activity: Unknown (06/20/2022)   Exercise Vital Sign    Days of Exercise per Week: 0 days    Minutes of Exercise per Session: Not on file  Stress: No Stress Concern Present (06/20/2022)   [employer] of Occupational Health - Occupational Stress Questionnaire    Feeling of Stress : Not at all  Social Connections: Unknown (06/20/2022)   Social Connection and Isolation Panel [NHANES]    Frequency of Communication with Friends and Family: Once a week    Frequency of Social Gatherings with Friends and Family: More than three times a week    Attends Religious Services: More than 4 times per year    Active Member of [employer] or Organizations: Patient declined    Attends Banker Meetings: Not on file    Marital Status: Married  Intimate Partner Violence: Not on file    Lab  Results  Component Value Date   HGBA1C 6.9 (H) 02/27/2023   HGBA1C 6.9 (A) 12/31/2022   HGBA1C 6.4 (A) 07/31/2022   Lab Results  Component Value Date   CHOL 119 02/27/2023   Lab Results  Component Value Date   HDL 47 (L) 02/27/2023   Lab Results  Component Value Date   LDLCALC 48 02/27/2023   Lab Results  Component Value Date   TRIG 158 (H) 02/27/2023   Lab Results  Component Value Date   CHOLHDL 2.5 02/27/2023   Lab Results  Component Value Date   CREATININE 0.89 02/27/2023   Lab Results  Component Value Date   GFR 65.98 12/31/2022   Lab Results  Component Value Date   MICROALBUR 4.0 02/27/2023      Component Value Date/Time   NA 137 02/27/2023 0825   NA 139 01/16/2022 0822   NA 139 04/01/2012 0404   K 4.8 02/27/2023 0825   K 3.9 04/01/2012 0404   CL 99 02/27/2023 0825   CL 106 04/01/2012 0404   CO2 27 02/27/2023 0825   CO2 29 04/01/2012 0404   GLUCOSE 93 02/27/2023 0825   GLUCOSE 95 04/01/2012 0404   BUN 24 02/27/2023 0825   BUN 25 01/16/2022 0822   BUN 7 04/01/2012 0404   CREATININE 0.89 02/27/2023 0825   CALCIUM 9.7 02/27/2023 0825   CALCIUM 8.5 04/01/2012 0404   PROT 7.4 02/27/2023 0825   PROT 7.6 09/04/2021 0912   PROT 7.7 03/25/2012 0851   ALBUMIN 4.3 12/31/2022 0836   ALBUMIN 4.3 09/04/2021 0912   ALBUMIN 3.7 03/25/2012 0851   AST 14 02/27/2023 0825   AST 35 03/25/2012 0851   ALT 15 02/27/2023 0825   ALT 50 03/25/2012 0851   ALKPHOS 75 12/31/2022 0836   ALKPHOS 111 03/25/2012 0851   BILITOT 0.5 02/27/2023 0825   BILITOT 0.5 09/04/2021 0912   BILITOT 0.4 03/25/2012 0851   GFRNONAA >60 07/14/2021 0249   GFRNONAA >60 04/01/2012 0404   GFRAA >60 04/01/2012 0404      Latest Ref Rng & Units 02/27/2023    8:25 AM 12/31/2022    8:36 AM 06/20/2022   12:13 PM  BMP  Glucose 65 - 99 mg/dL 93  161  92   BUN 7 - 25 mg/dL 24  28  31    Creatinine 0.50 - 1.05 mg/dL 0.96  0.45  4.09   BUN/Creat Ratio 6 - 22 (calc) SEE NOTE:     Sodium 135 -  146 mmol/L 137  137  139   Potassium 3.5 - 5.3 mmol/L 4.8  4.6  4.9   Chloride 98 - 110 mmol/L 99  98  100   CO2 20 - 32 mmol/L 27  31  30    Calcium 8.6 - 10.4 mg/dL 9.7  9.7  9.7        Component Value Date/Time   WBC 8.4 12/31/2022 0836   RBC 4.14 12/31/2022 0836   HGB 12.3 12/31/2022 0836   HGB 12.6 04/01/2012 0404   HCT 37.8 12/31/2022 0836   HCT 37.6 04/01/2012 0404   PLT 328.0 12/31/2022 0836   PLT 293 04/01/2012 0404   MCV 91.3 12/31/2022 0836   MCV 93 04/01/2012 0404   MCH 29.8 07/15/2021 0407   MCHC 32.5 12/31/2022 0836   RDW 13.8 12/31/2022 0836   RDW 13.4 04/01/2012 0404   LYMPHSABS 1.6 04/01/2012 0404   MONOABS 0.8 04/01/2012  0404   EOSABS 0.1 04/01/2012 0404   BASOSABS 0.1 04/01/2012 0404     Parts of this note may have been dictated using voice recognition software. There may be variances in spelling and vocabulary which are unintentional. Not all errors are proofread. Please notify the Thereasa Parkin if any discrepancies are noted or if the meaning of any statement is not clear.

## 2023-03-06 NOTE — Patient Instructions (Signed)

## 2023-03-18 ENCOUNTER — Telehealth: Payer: Self-pay

## 2023-03-18 ENCOUNTER — Other Ambulatory Visit (HOSPITAL_COMMUNITY): Payer: Self-pay

## 2023-03-18 NOTE — Telephone Encounter (Signed)
Dexcom needs a PA

## 2023-03-18 NOTE — Telephone Encounter (Signed)
 Pharmacy Patient Advocate Encounter   Received notification from Pt Calls Messages that prior authorization for Dexcom G7 sensor is required/requested.   Insurance verification completed.   The patient is insured through [facility] .   Per test claim: PA required; PA submitted to above mentioned insurance via CoverMyMeds Key/confirmation #/EOC [id number] Status is pending

## 2023-03-23 ENCOUNTER — Encounter: Payer: Self-pay | Admitting: Nurse Practitioner

## 2023-03-23 DIAGNOSIS — E1169 Type 2 diabetes mellitus with other specified complication: Secondary | ICD-10-CM

## 2023-03-23 MED ORDER — METFORMIN HCL 500 MG PO TABS
500.0000 mg | ORAL_TABLET | Freq: Every day | ORAL | 3 refills | Status: DC
Start: 2023-03-23 — End: 2023-04-06

## 2023-03-25 ENCOUNTER — Encounter: Payer: Self-pay | Admitting: "Endocrinology

## 2023-03-31 NOTE — Telephone Encounter (Signed)
Pharmacy Patient Advocate Encounter  Received notification from [facility] that Prior Authorization for Dexcom G7 sensor has been DENIED.  Full denial letter will be uploaded to the media tab. See denial reason below.  Your plan only covers this product if you are using multiple daily insulin injections  PA #/Case ID/Reference #: [id number]

## 2023-04-06 ENCOUNTER — Ambulatory Visit: Payer: 59 | Admitting: "Endocrinology

## 2023-04-06 ENCOUNTER — Encounter: Payer: Self-pay | Admitting: "Endocrinology

## 2023-04-06 VITALS — BP 120/60 | HR 101 | Ht 65.0 in | Wt 265.4 lb

## 2023-04-06 DIAGNOSIS — Z7984 Long term (current) use of oral hypoglycemic drugs: Secondary | ICD-10-CM | POA: Diagnosis not present

## 2023-04-06 DIAGNOSIS — E781 Pure hyperglyceridemia: Secondary | ICD-10-CM | POA: Diagnosis not present

## 2023-04-06 DIAGNOSIS — Z7985 Long-term (current) use of injectable non-insulin antidiabetic drugs: Secondary | ICD-10-CM | POA: Diagnosis not present

## 2023-04-06 DIAGNOSIS — E1165 Type 2 diabetes mellitus with hyperglycemia: Secondary | ICD-10-CM | POA: Diagnosis not present

## 2023-04-06 MED ORDER — OZEMPIC (2 MG/DOSE) 8 MG/3ML ~~LOC~~ SOPN: 2.0000 mg | PEN_INJECTOR | SUBCUTANEOUS | 1 refills | Status: DC

## 2023-04-06 MED ORDER — METFORMIN HCL 500 MG PO TABS: 1000.0000 mg | ORAL_TABLET | Freq: Two times a day (BID) | ORAL | 4 refills | Status: DC

## 2023-04-06 NOTE — Progress Notes (Signed)
Outpatient Endocrinology Note [place], MD  04/06/23   Karen Miller 24-Jun-1960 [id number]  Referring Provider: Eden Emms, NP Primary Care Provider: Eden Emms, NP Reason for consultation: Subjective   Assessment & Plan  Diagnoses and all orders for this visit:  Uncontrolled type 2 diabetes mellitus with hyperglycemia (HCC) -     Semaglutide, 2 MG/DOSE, (OZEMPIC, 2 MG/DOSE,) 8 MG/3ML SOPN; Inject 2 mg into the skin once a week. -     metFORMIN (GLUCOPHAGE) 500 MG tablet; Take 2 tablets (1,000 mg total) by mouth 2 (two) times daily with a meal.  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,  Lab Results  Component Value Date   GFR 65.98 12/31/2022   Hba1c goal less than 7, current Hba1c is  Lab Results  Component Value Date   HGBA1C 6.9 (H) 02/27/2023   Will recommend the following: Metformin 500mg  twice a day->increase gradually for 1+2 then 2+2  Ozempic 1 mg/week->2 mg weekly after 4 weeks  DexCom was not covered  Stopped trulicity due to body shakes  No known contraindications/side effects to any of above medications No history of MEN syndrome/medullary thyroid cancer/pancreatitis or pancreatic cancer in self or family  -Last LD and Tg are as follows: Lab Results  Component Value Date   LDLCALC 48 02/27/2023    Lab Results  Component Value Date   TRIG 158 (H) 02/27/2023   -On atorvastatin 40 mg QD -Follow low fat diet and exercise   -Blood pressure goal <140/90 - Microalbumin/creatinine goal is < 30 -Last MA/Cr is as follows: Lab Results  Component Value Date   MICROALBUR 4.0 02/27/2023   -on ACE/ARB Valsartan 320 mg/day -diet changes including salt restriction -limit eating outside -counseled BP targets per standards of diabetes care -uncontrolled blood pressure can lead to  retinopathy, nephropathy and cardiovascular and atherosclerotic heart disease  Reviewed and counseled on: -A1C target -Blood sugar targets -Complications of uncontrolled diabetes  -Checking blood sugar before meals and bedtime and bring log next visit -All medications with mechanism of action and side effects -Hypoglycemia management: rule of 15's, Glucagon Emergency Kit and medical alert ID -low-carb low-fat plate-method diet -At least 20 minutes of physical activity per day -Annual dilated retinal eye exam and foot exam -compliance and follow up needs -follow up as scheduled or earlier if problem gets worse  Call if blood sugar is less than 70 or consistently above 250    Take a 15 gm snack of carbohydrate at bedtime before you go to sleep if your blood sugar is less than 100.    If you are going to fast after midnight for a test or procedure, ask your physician for instructions on how to reduce/decrease your insulin dose.    Call if blood sugar is less than 70 or consistently above 250  -Treating a low sugar by rule of 15  (15 gms of sugar every 15 min until sugar is more than 70) If you feel your sugar is low, test your sugar to be sure If your sugar is low (less than 70), then take 15 grams of a fast acting Carbohydrate (3-4 glucose tablets or glucose gel or 4 ounces of juice or regular soda) Recheck your sugar 15 min after treating low to make sure it is more than 70 If sugar is still less than 70, treat again  with 15 grams of carbohydrate          Don't drive the hour of hypoglycemia  If unconscious/unable to eat or drink by mouth, use glucagon injection or nasal spray baqsimi and call 911. Can repeat again in 15 min if still unconscious.  Return in about 3 months (around 07/05/2023).   I have reviewed current medications, nurse's notes, allergies, vital signs, past medical and surgical history, family medical history, and social history for this encounter. Counseled patient  on symptoms, examination findings, lab findings, imaging results, treatment decisions and monitoring and prognosis. The patient understood the recommendations and agrees with the treatment plan. All questions regarding treatment plan were fully answered.  [place], MD  04/06/23    History of Present Illness Karen Miller is a 63 y.o. year old female who presents for evaluation of Type II diabetes mellitus.  Karen Miller was first diagnosed in 2022.   Diabetes education +  Home diabetes regimen: Metformin 500mg  every day Ozempic 1 mg/week since 2 wks  COMPLICATIONS -  MI/Stroke -  retinopathy -  neuropathy + nephropathy/microalbuminuria  SYMPTOMS REVIEWED - Polyuria - Weight loss - Blurred vision  BLOOD SUGAR DATA Checks intermittently 98-201  Physical Exam  BP 120/60   Pulse (!) 101   Ht 5\' 5"  (1.651 m)   Wt 265 lb 6.4 oz (120.4 kg)   SpO2 98%   BMI 44.16 kg/m    Constitutional: well developed, well nourished Head: normocephalic, atraumatic Eyes: sclera anicteric, no redness Neck: supple Lungs: normal respiratory effort Neurology: alert and oriented Skin: dry, no appreciable rashes Musculoskeletal: no appreciable defects Psychiatric: normal mood and affect Diabetic Foot Exam - Simple   No data filed      Current Medications Patient's Medications  New Prescriptions   SEMAGLUTIDE, 2 MG/DOSE, (OZEMPIC, 2 MG/DOSE,) 8 MG/3ML SOPN    Inject 2 mg into the skin once a week.  Previous Medications   AMLODIPINE (NORVASC) 5 MG TABLET    Take 1 tablet (5 mg total) by mouth daily.   ASPIRIN EC (QC LO-DOSE ASPIRIN) 81 MG TABLET    TAKE 1 TABLET DAILY - SWALLOW WHOLE   ATORVASTATIN (LIPITOR) 40 MG TABLET    Take 1 tablet (40 mg total) by mouth daily.   CARVEDILOL (COREG) 12.5 MG TABLET    Take 1 tablet (12.5 mg total) by mouth 2 (two) times daily.   CHOLECALCIFEROL (VITAMIN D3) 25 MCG (1000 UNIT) TABLET    Take 2,000 Units by mouth daily.   CYANOCOBALAMIN  (VITAMIN B12) 1000 MCG TABLET    Take 1,000 mcg by mouth daily.   FUROSEMIDE (LASIX) 20 MG TABLET    Take 1 tablet (20 mg total) by mouth daily.   HYDROCHLOROTHIAZIDE (HYDRODIURIL) 25 MG TABLET    Take 1 tablet (25 mg total) by mouth daily.   VALSARTAN (DIOVAN) 320 MG TABLET    Take 1 tablet (320 mg total) by mouth daily.  Modified Medications   Modified Medication Previous Medication   METFORMIN (GLUCOPHAGE) 500 MG TABLET metFORMIN (GLUCOPHAGE) 500 MG tablet      Take 2 tablets (1,000 mg total) by mouth 2 (two) times daily with a meal.    Take 1 tablet (500 mg total) by mouth daily with breakfast.  Discontinued Medications   CONTINUOUS GLUCOSE SENSOR (DEXCOM G7 SENSOR) MISC    1 Device by Does not apply route continuous.   SEMAGLUTIDE, 1 MG/DOSE, 4 MG/3ML SOPN    Inject 1 mg as  directed once a week.    Allergies Allergies  Allergen Reactions   Trulicity [Dulaglutide] Other (See Comments)    tremor    Past Medical History Past Medical History:  Diagnosis Date   Diabetes mellitus without complication (HCC)    on meds   Hyperlipidemia    on meds   Hypertension    on meds    Past Surgical History Past Surgical History:  Procedure Laterality Date   FACIAL FRACTURE SURGERY  2008   right side of the face completely plated-crushed her facial bones on that side   OTHER SURGICAL HISTORY  2008   right arm fractured in multiple areas-from right elbow down to right wrist plated and screws   UMBILICAL HERNIA REPAIR     has mesh   WISDOM TOOTH EXTRACTION      Family History family history includes Breast cancer (age of onset: 54) in her sister; Diabetes in her brother, mother, and sister; Heart attack in her sister; Hypertension in her sister; Other in her brother; Thyroid disease in her sister and sister.  Social History Social History   Socioeconomic History   Marital status: Married    Spouse name: Not on file   Number of children: 2   Years of education: Not on file    Highest education level: Not on file  Occupational History   Not on file  Tobacco Use   Smoking status: Never   Smokeless tobacco: Never  Vaping Use   Vaping status: Never Used  Substance and Sexual Activity   Alcohol use: Never   Drug use: Never   Sexual activity: Not on file  Other Topics Concern   Not on file  Social History Narrative   Retired: worked for the state for 39      Consider rotating when you check your blood glucose (when you wake, 2 hours after breakfast or 2 hours after dinner)   Blood glucose goals:        Fasting:  80-130        2 hours after the start of a meal:  Less than 180        If your post meal reading is higher than 180, identify what you ate or other things that could have contributed              Social Drivers of Health   Financial Resource Strain: Patient Declined (06/20/2022)   Overall Financial Resource Strain (CARDIA)    Difficulty of Paying Living Expenses: Patient declined  Food Insecurity: Unknown (06/20/2022)   Hunger Vital Sign    Worried About Running Out of Food in the Last Year: Never true    Ran Out of Food in the Last Year: Patient declined  Transportation Needs: No Transportation Needs (06/20/2022)   PRAPARE - Administrator, Civil Service (Medical): No    Lack of Transportation (Non-Medical): No  Physical Activity: Unknown (06/20/2022)   Exercise Vital Sign    Days of Exercise per Week: 0 days    Minutes of Exercise per Session: Not on file  Stress: No Stress Concern Present (06/20/2022)   [employer] of Occupational Health - Occupational Stress Questionnaire    Feeling of Stress : Not at all  Social Connections: Unknown (06/20/2022)   Social Connection and Isolation Panel [NHANES]    Frequency of Communication with Friends and Family: Once a week    Frequency of Social Gatherings with Friends and Family: More than three times a week  Attends Religious Services: More than 4 times per year    Active Member  of Clubs or Organizations: Patient declined    Attends Banker Meetings: Not on file    Marital Status: Married  Intimate Partner Violence: Not on file    Lab Results  Component Value Date   HGBA1C 6.9 (H) 02/27/2023   HGBA1C 6.9 (A) 12/31/2022   HGBA1C 6.4 (A) 07/31/2022   Lab Results  Component Value Date   CHOL 119 02/27/2023   Lab Results  Component Value Date   HDL 47 (L) 02/27/2023   Lab Results  Component Value Date   LDLCALC 48 02/27/2023   Lab Results  Component Value Date   TRIG 158 (H) 02/27/2023   Lab Results  Component Value Date   CHOLHDL 2.5 02/27/2023   Lab Results  Component Value Date   CREATININE 0.89 02/27/2023   Lab Results  Component Value Date   GFR 65.98 12/31/2022   Lab Results  Component Value Date   MICROALBUR 4.0 02/27/2023      Component Value Date/Time   NA 137 02/27/2023 0825   NA 139 01/16/2022 0822   NA 139 04/01/2012 0404   K 4.8 02/27/2023 0825   K 3.9 04/01/2012 0404   CL 99 02/27/2023 0825   CL 106 04/01/2012 0404   CO2 27 02/27/2023 0825   CO2 29 04/01/2012 0404   GLUCOSE 93 02/27/2023 0825   GLUCOSE 95 04/01/2012 0404   BUN 24 02/27/2023 0825   BUN 25 01/16/2022 0822   BUN 7 04/01/2012 0404   CREATININE 0.89 02/27/2023 0825   CALCIUM 9.7 02/27/2023 0825   CALCIUM 8.5 04/01/2012 0404   PROT 7.4 02/27/2023 0825   PROT 7.6 09/04/2021 0912   PROT 7.7 03/25/2012 0851   ALBUMIN 4.3 12/31/2022 0836   ALBUMIN 4.3 09/04/2021 0912   ALBUMIN 3.7 03/25/2012 0851   AST 14 02/27/2023 0825   AST 35 03/25/2012 0851   ALT 15 02/27/2023 0825   ALT 50 03/25/2012 0851   ALKPHOS 75 12/31/2022 0836   ALKPHOS 111 03/25/2012 0851   BILITOT 0.5 02/27/2023 0825   BILITOT 0.5 09/04/2021 0912   BILITOT 0.4 03/25/2012 0851   GFRNONAA >60 07/14/2021 0249   GFRNONAA >60 04/01/2012 0404   GFRAA >60 04/01/2012 0404      Latest Ref Rng & Units 02/27/2023    8:25 AM 12/31/2022    8:36 AM 06/20/2022   12:13 PM  BMP   Glucose 65 - 99 mg/dL 93  409  92   BUN 7 - 25 mg/dL 24  28  31    Creatinine 0.50 - 1.05 mg/dL 8.11  9.14  7.82   BUN/Creat Ratio 6 - 22 (calc) SEE NOTE:     Sodium 135 - 146 mmol/L 137  137  139   Potassium 3.5 - 5.3 mmol/L 4.8  4.6  4.9   Chloride 98 - 110 mmol/L 99  98  100   CO2 20 - 32 mmol/L 27  31  30    Calcium 8.6 - 10.4 mg/dL 9.7  9.7  9.7        Component Value Date/Time   WBC 8.4 12/31/2022 0836   RBC 4.14 12/31/2022 0836   HGB 12.3 12/31/2022 0836   HGB 12.6 04/01/2012 0404   HCT 37.8 12/31/2022 0836   HCT 37.6 04/01/2012 0404   PLT 328.0 12/31/2022 0836   PLT 293 04/01/2012 0404   MCV 91.3 12/31/2022 0836   MCV  93 04/01/2012 0404   MCH 29.8 07/15/2021 0407   MCHC 32.5 12/31/2022 0836   RDW 13.8 12/31/2022 0836   RDW 13.4 04/01/2012 0404   LYMPHSABS 1.6 04/01/2012 0404   MONOABS 0.8 04/01/2012 0404   EOSABS 0.1 04/01/2012 0404   BASOSABS 0.1 04/01/2012 0404     Parts of this note may have been dictated using voice recognition software. There may be variances in spelling and vocabulary which are unintentional. Not all errors are proofread. Please notify the Thereasa Parkin if any discrepancies are noted or if the meaning of any statement is not clear.

## 2023-04-10 LAB — HM DIABETES EYE EXAM

## 2023-05-05 ENCOUNTER — Ambulatory Visit: Payer: BC Managed Care – PPO | Admitting: Nurse Practitioner

## 2023-05-07 ENCOUNTER — Ambulatory Visit: Payer: 59 | Admitting: Nurse Practitioner

## 2023-05-07 VITALS — BP 126/70 | HR 85 | Temp 97.9°F | Ht 65.0 in | Wt 256.0 lb

## 2023-05-07 DIAGNOSIS — R29898 Other symptoms and signs involving the musculoskeletal system: Secondary | ICD-10-CM | POA: Diagnosis not present

## 2023-05-07 DIAGNOSIS — E785 Hyperlipidemia, unspecified: Secondary | ICD-10-CM

## 2023-05-07 DIAGNOSIS — Z7984 Long term (current) use of oral hypoglycemic drugs: Secondary | ICD-10-CM | POA: Diagnosis not present

## 2023-05-07 DIAGNOSIS — Z7985 Long-term (current) use of injectable non-insulin antidiabetic drugs: Secondary | ICD-10-CM

## 2023-05-07 DIAGNOSIS — E1169 Type 2 diabetes mellitus with other specified complication: Secondary | ICD-10-CM

## 2023-05-07 LAB — POCT GLYCOSYLATED HEMOGLOBIN (HGB A1C): Hemoglobin A1C: 5.8 % — AB (ref 4.0–5.6)

## 2023-05-07 NOTE — Assessment & Plan Note (Signed)
 Patient currently maintained on metformin 1000 mg twice daily and Ozempic 2 mg weekly.  She has been followed by endocrinology at this point.  Will relinquish all diabetic medications to them.

## 2023-05-07 NOTE — Progress Notes (Signed)
 Established Patient Office Visit  Subjective   Patient ID: Karen Miller, female    DOB: 02-15-1961  Age: 64 y.o. MRN: [id number]  Chief Complaint  Patient presents with   Diabetes    Follow up. Pt complains of having no energy lately.       DM2: patient was referred to Endocrine by patients cardiologist she is currenlty on metformin 1000mg  BID and ozempic 2mg  weekly. She was increased metformin and ozempic. States that she did titrate up to 1 and did well then she went to 56m and was having stomach pain and increased indigestion. She is planning to message her endocrine provider for further guidance   Leg trouble: we have talked about this in the past. States that she will walk from her mailbox and back. State that they get weak at the top of the legs. States that it has been gong on for the past 3 years but worse over the past 6 months. States that she will rest for a few seconds and then she can repeat the same distance.  She does have some swelling to bilateral lower extremities.  She will use compression garments that seem to help.  Patient states she does not have them on today.  Patient states her concerns used to walk 2 miles every day with her spouse but is unable to do that now due to the fatigability of her legs.  Patient unable to walk through the local grocery store without having to take breaks for leg fatigability.  Patient states that her family has "poor circulation".    Review of Systems  Constitutional:  Negative for chills and fever.  Respiratory:  Negative for shortness of breath.   Cardiovascular:  Positive for leg swelling. Negative for chest pain.  Gastrointestinal:        BM daily   Neurological:  Negative for dizziness and headaches.      Objective:     BP 126/70   Pulse 85   Temp 97.9 F (36.6 C) (Oral)   Ht 5\' 5"  (1.651 m)   Wt 256 lb (116.1 kg)   SpO2 100%   BMI 42.60 kg/m  BP Readings from Last 3 Encounters:  05/07/23 126/70  04/06/23  120/60  03/06/23 128/70   Wt Readings from Last 3 Encounters:  05/07/23 256 lb (116.1 kg)  04/06/23 265 lb 6.4 oz (120.4 kg)  03/06/23 266 lb (120.7 kg)   SpO2 Readings from Last 3 Encounters:  05/07/23 100%  04/06/23 98%  03/06/23 99%      Physical Exam Vitals and nursing note reviewed.  Constitutional:      Appearance: Normal appearance.  Cardiovascular:     Rate and Rhythm: Normal rate and regular rhythm.     Pulses:          Popliteal pulses are 2+ on the right side and 2+ on the left side.     Heart sounds: Normal heart sounds.  Pulmonary:     Effort: Pulmonary effort is normal.     Breath sounds: Normal breath sounds.  Abdominal:     General: Bowel sounds are normal.  Musculoskeletal:     Lumbar back: Negative right straight leg raise test and negative left straight leg raise test.     Right lower leg: Edema present.     Left lower leg: Edema present.  Neurological:     Mental Status: She is alert.     Deep Tendon Reflexes:     Reflex Scores:  Patellar reflexes are 1+ on the right side and 1+ on the left side.    Comments: Bilateral lower extremity strength 5/5      Results for orders placed or performed in visit on 05/07/23  POCT glycosylated hemoglobin (Hb A1C)  Result Value Ref Range   Hemoglobin A1C 5.8 (A) 4.0 - 5.6 %   HbA1c POC (<> result, manual entry)     HbA1c, POC (prediabetic range)     HbA1c, POC (controlled diabetic range)        The ASCVD Risk score (Arnett DK, et al., 2019) failed to calculate for the following reasons:   The valid total cholesterol range is 130 to 320 mg/dL    Assessment & Plan:   Problem List Items Addressed This Visit       Endocrine   Type 2 diabetes mellitus with hyperlipidemia (HCC) - Primary   Patient currently maintained on metformin 1000 mg twice daily and Ozempic 2 mg weekly.  She has been followed by endocrinology at this point.  Will relinquish all diabetic medications to them.      Relevant  Orders   POCT glycosylated hemoglobin (Hb A1C) (Completed)     Other   Leg fatigue   History of the same unclear etiology.  Patient does have family history of circulatory disorders.  Patient has had an MI along with diabetes and hypercholesterolemia.  Will send to vascular for further workup.  Unsure if this is claudication or neurogenic      Relevant Orders   Ambulatory referral to Vascular Surgery    Return in about 8 months (around 01/04/2024) for CPE and Labs.    Audria Nine, NP

## 2023-05-07 NOTE — Patient Instructions (Signed)
 Nice to see you today Your A1C was 5.8% I have referred you to vascular I don't need to see you until time for your physical approx 8 months

## 2023-05-07 NOTE — Assessment & Plan Note (Addendum)
 History of the same unclear etiology.  Patient does have family history of circulatory disorders.  Patient has had an MI along with diabetes and hypercholesterolemia.  Will send to vascular for further workup.  Unsure if this is claudication or neurogenic

## 2023-05-26 ENCOUNTER — Encounter: Payer: Self-pay | Admitting: "Endocrinology

## 2023-06-04 ENCOUNTER — Ambulatory Visit: Payer: BC Managed Care – PPO | Attending: Internal Medicine | Admitting: Internal Medicine

## 2023-06-04 ENCOUNTER — Encounter: Payer: Self-pay | Admitting: Internal Medicine

## 2023-06-04 ENCOUNTER — Ambulatory Visit (INDEPENDENT_AMBULATORY_CARE_PROVIDER_SITE_OTHER)

## 2023-06-04 VITALS — BP 118/70 | HR 85 | Ht 65.0 in | Wt 259.6 lb

## 2023-06-04 DIAGNOSIS — Z79899 Other long term (current) drug therapy: Secondary | ICD-10-CM

## 2023-06-04 DIAGNOSIS — E785 Hyperlipidemia, unspecified: Secondary | ICD-10-CM

## 2023-06-04 DIAGNOSIS — I2510 Atherosclerotic heart disease of native coronary artery without angina pectoris: Secondary | ICD-10-CM | POA: Diagnosis not present

## 2023-06-04 DIAGNOSIS — R000 Tachycardia, unspecified: Secondary | ICD-10-CM

## 2023-06-04 DIAGNOSIS — I5032 Chronic diastolic (congestive) heart failure: Secondary | ICD-10-CM

## 2023-06-04 DIAGNOSIS — I5033 Acute on chronic diastolic (congestive) heart failure: Secondary | ICD-10-CM

## 2023-06-04 DIAGNOSIS — I10 Essential (primary) hypertension: Secondary | ICD-10-CM

## 2023-06-04 DIAGNOSIS — R002 Palpitations: Secondary | ICD-10-CM

## 2023-06-04 DIAGNOSIS — M79605 Pain in left leg: Secondary | ICD-10-CM

## 2023-06-04 DIAGNOSIS — I342 Nonrheumatic mitral (valve) stenosis: Secondary | ICD-10-CM

## 2023-06-04 DIAGNOSIS — R55 Syncope and collapse: Secondary | ICD-10-CM

## 2023-06-04 DIAGNOSIS — E1169 Type 2 diabetes mellitus with other specified complication: Secondary | ICD-10-CM

## 2023-06-04 MED ORDER — VALSARTAN 320 MG PO TABS: 320.0000 mg | ORAL_TABLET | Freq: Every day | ORAL | 3 refills | Status: AC

## 2023-06-04 NOTE — Progress Notes (Unsigned)
 Enrolled patient for a 14 day Zio XT  monitor to be mailed to patients home

## 2023-06-04 NOTE — Patient Instructions (Signed)
 Medication Instructions:  No Changes  Lab Work: None  Testing:  ZIO XT- Long Term Monitor Instructions  Your physician has requested you wear a ZIO patch monitor for 14 days.  This is a single patch monitor. [employer] supplies one patch monitor per enrollment. Additional stickers are not available. Please do not apply patch if you will be having a Nuclear Stress Test,  Echocardiogram, Cardiac CT, MRI, or Chest Xray during the period you would be wearing the  monitor. The patch cannot be worn during these tests. You cannot remove and re-apply the  ZIO XT patch monitor.  Your ZIO patch monitor will be mailed 3 day USPS to your address on file. It may take 3-5 days  to receive your monitor after you have been enrolled.  Once you have received your monitor, please review the enclosed instructions. Your monitor  has already been registered assigning a specific monitor serial # to you.  Billing and Patient Assistance Program Information  We have supplied [employer] with any of your insurance information on file for billing purposes. [employer] offers a sliding scale Patient Assistance Program for patients that do not have  insurance, or whose insurance does not completely cover the cost of the ZIO monitor.  You must apply for the Patient Assistance Program to qualify for this discounted rate.  To apply, please call [employer] at [phone], select option 4, select option 2, ask to apply for  Patient Assistance Program. Meredeth Ide will ask your household income, and how many people  are in your household. They will quote your out-of-pocket cost based on that information.  [employer] will also be able to set up a 33-month, interest-free payment plan if needed.  Applying the monitor   Shave hair from upper left chest.  Hold abrader disc by orange tab. Rub abrader in 40 strokes over the upper left chest as  indicated in your monitor instructions.  Clean area with 4 enclosed alcohol pads. Let dry.  Apply  patch as indicated in monitor instructions. Patch will be placed under collarbone on left  side of chest with arrow pointing upward.  Rub patch adhesive wings for 2 minutes. Remove white label marked "1". Remove the white  label marked "2". Rub patch adhesive wings for 2 additional minutes.  While looking in a mirror, press and release button in center of patch. A small green light will  flash 3-4 times. This will be your only indicator that the monitor has been turned on.  Do not shower for the first 24 hours. You may shower after the first 24 hours.  Press the button if you feel a symptom. You will hear a small click. Record Date, Time and  Symptom in the Patient Logbook.  When you are ready to remove the patch, follow instructions on the last 2 pages of Patient  Logbook. Stick patch monitor onto the last page of Patient Logbook.  Place Patient Logbook in the blue and white box. Use locking tab on box and tape box closed  securely. The blue and white box has prepaid postage on it. Please place it in the mailbox as  soon as possible. Your physician should have your test results approximately 7 days after the  monitor has been mailed back to [facility].  Call [facility] Customer Care at [phone] if you have questions regarding  your ZIO XT patch monitor. Call them immediately if you see an orange light blinking on your  monitor.  If your monitor falls off in less  than 4 days, contact our Monitor department at [phone].  If your monitor becomes loose or falls off after 4 days call [employer] at [phone] for  suggestions on securing your monitor   Follow-Up: At [facility], you and your health needs are our priority.  As part of our continuing mission to provide you with exceptional heart care, we have created designated Provider Care Teams.  These Care Teams include your primary Cardiologist (physician) and Advanced Practice Providers (APPs -  Physician Assistants  and Nurse Practitioners) who all work together to provide you with the care you need, when you need it.   Your next appointment:    07/17/2023 (Friday) at 8:00 am  Provider:    Bernadene Person, NP

## 2023-06-04 NOTE — Progress Notes (Signed)
 Cardiology Office Note:  .   Date:  06/04/2023  ID:  Karen Miller, DOB 12/20/1960, MRN [id number] PCP: Karen Emms, NP  [facility] Providers Cardiologist:  Karen Poisson, MD    History of Present Illness: Karen Miller   Karen Miller is a 63 y.o. female.  Discussed the use of AI scribe software for clinical note transcription with the patient, who gave verbal consent to proceed.  History of Present Illness   The patient, with a history of diabetes and hypertension, presents with episodes of feeling like she is going to pass out and rapid heart rate. These episodes occurred twice in February and have not recurred since. The patient describes these episodes as feeling like she is on the verge of passing out, with a rapid heart rate and feeling hot. The episodes last for about a minute and then resolve. The patient also reports leg discomfort, particularly when walking. This discomfort is in the upper legs and has been present for a while. The patient has been referred to a vascular specialist for this issue.  The patient's diabetes is managed with Ozempic, which was increased to 2mg . However, the patient stopped this medication due to stomach discomfort and decreased appetite. The patient's blood sugars have been running around 130 in the mornings. The patient's A1c has decreased to 5.8. The patient denies any episodes of low blood sugar, which previously we felt was the source of her presyncope.  The patient's hypertension is managed with several medications, including amlodipine, carvedilol, Lasix, hydrochlorothiazide, and valsartan. The patient's blood pressure has been running around 120/70. The patient denies any changes in her medications since the episodes in February. difficult for her to check her BP during symptomatic episodes. Often checks it when shopping at [employer] per my instruction.        ROS: negative except per HPI above.  Studies Reviewed: Karen Miller   EKG  Interpretation Date/Time:  Thursday June 04 2023 08:18:51 EDT Ventricular Rate:  85 PR Interval:  206 QRS Duration:  86 QT Interval:  372 QTC Calculation: 442 R Axis:   18  Text Interpretation: Normal sinus rhythm Normal ECG When compared with ECG of 02-Dec-2022 08:21, No significant change was found Confirmed by Karen Miller ([id number]) on 06/04/2023 8:42:42 AM    Results   LABS A1c: 5.8% (05/21/2023) Cholesterol: Normal (02/2023) Triglycerides: Elevated (02/2023) Kidney function: Normal (02/2023) Potassium: Normal (02/2023) Liver function tests: Normal (02/2023) Complete blood count: Normal (02/2023)  RADIOLOGY Coronary CT scan: Minimal plaque (2024)  DIAGNOSTIC Echocardiogram: Normal left ventricular function, mitral valve calcifications (2024) EKG: Normal (06/04/2023)     Risk Assessment/Calculations:    Physical Exam:   VS:  BP 118/70 (BP Location: Left Arm, Patient Position: Sitting, Cuff Size: Large)   Pulse 85   Ht 5\' 5"  (1.651 m)   Wt 259 lb 9.6 oz (117.8 kg)   SpO2 97%   BMI 43.20 kg/m    Wt Readings from Last 3 Encounters:  06/04/23 259 lb 9.6 oz (117.8 kg)  05/07/23 256 lb (116.1 kg)  04/06/23 265 lb 6.4 oz (120.4 kg)     Physical Exam   VITALS: BP- 118/70 GENERAL: Alert, cooperative, well developed, no acute distress. HEENT: Normocephalic, normal oropharynx, moist mucous membranes. CHEST: Clear to auscultation bilaterally, no wheezes, rhonchi, or crackles. CARDIOVASCULAR: Faint murmur of mitral stenosis, normal heart rate and rhythm, S1 and S2 normal. ABDOMEN: Soft, non-tender, non-distended, normal bowel sounds. EXTREMITIES: No cyanosis or edema. NEUROLOGICAL: Cranial nerves  grossly intact, moves all extremities without gross motor or sensory deficit.      ASSESSMENT AND PLAN: .    Assessment and Plan    Presyncope and palpitations Two episodes in February with symptoms concern for possible arrhytmia/atrial fibrillation risk due to mitral  valve calcification. No recent episodes. Discussed atrial fibrillation risk and anticoagulation necessity. - Order two-week Zio monitor for arrhythmia evaluation. - Review results with nurse practitioner or PA in six weeks.  Mitral valve calcification/mitral valve stenosis. Contributes to atrial fibrillation risk. Discussed carvedilol's role in managing heart rate and mitral stenosis symptoms. - Monitor heart rate and symptoms with Zio monitor. - Continue carvedilol for heart rate control.  Diastolic heart failure Managed with furosemide. No acute symptoms. - Continue furosemide 20 mg daily.  Peripheral artery disease (suspected) Leg discomfort upon walking. Scheduled for vascular evaluation and ultrasound.  - Proceed with scheduled vascular evaluation and ultrasound.  Hypertension Blood pressure well-controlled with current regimen. - Continue amlodipine 5 mg daily, carvedilol 12.5 mg BID, Lasix 20mg  daily, hydrochlorothiazide 25 mg daily, and valsartan 320 mg daily - Refill valsartan prescription for 1 year  Hyperlipidemia Cholesterol levels controlled with atorvastatin. Minimal plaque on coronary CT. Discussed lifestyle modifications for triglyceride reduction. - Continue atorvastatin 40 mg daily. - Encourage lifestyle modifications to reduce triglycerides.  Type 2 Diabetes Mellitus A1c improved with current regimen. Discussed resuming semaglutide at 1 mg due to previous tolerance and A1c impact. - Discuss with endocrinologist about resuming semaglutide at 1 mg.

## 2023-06-05 ENCOUNTER — Encounter: Payer: Self-pay | Admitting: "Endocrinology

## 2023-06-30 ENCOUNTER — Other Ambulatory Visit (INDEPENDENT_AMBULATORY_CARE_PROVIDER_SITE_OTHER): Payer: Self-pay | Admitting: Nurse Practitioner

## 2023-06-30 DIAGNOSIS — R29898 Other symptoms and signs involving the musculoskeletal system: Secondary | ICD-10-CM

## 2023-07-01 ENCOUNTER — Encounter (INDEPENDENT_AMBULATORY_CARE_PROVIDER_SITE_OTHER): Payer: Self-pay | Admitting: Nurse Practitioner

## 2023-07-01 ENCOUNTER — Ambulatory Visit (INDEPENDENT_AMBULATORY_CARE_PROVIDER_SITE_OTHER): Payer: Self-pay | Admitting: Nurse Practitioner

## 2023-07-01 ENCOUNTER — Ambulatory Visit (INDEPENDENT_AMBULATORY_CARE_PROVIDER_SITE_OTHER): Payer: Self-pay

## 2023-07-01 VITALS — BP 111/71 | HR 103 | Resp 18 | Ht 65.0 in | Wt 253.0 lb

## 2023-07-01 DIAGNOSIS — M79605 Pain in left leg: Secondary | ICD-10-CM

## 2023-07-01 DIAGNOSIS — R29898 Other symptoms and signs involving the musculoskeletal system: Secondary | ICD-10-CM

## 2023-07-01 DIAGNOSIS — E785 Hyperlipidemia, unspecified: Secondary | ICD-10-CM

## 2023-07-01 DIAGNOSIS — E1169 Type 2 diabetes mellitus with other specified complication: Secondary | ICD-10-CM | POA: Diagnosis not present

## 2023-07-01 DIAGNOSIS — M7989 Other specified soft tissue disorders: Secondary | ICD-10-CM | POA: Diagnosis not present

## 2023-07-02 LAB — VAS US ABI WITH/WO TBI
Left ABI: 1.1
Right ABI: 1.04

## 2023-07-06 ENCOUNTER — Encounter: Payer: Self-pay | Admitting: "Endocrinology

## 2023-07-06 ENCOUNTER — Ambulatory Visit: Payer: 59 | Admitting: "Endocrinology

## 2023-07-06 VITALS — BP 114/66 | HR 96 | Ht 65.0 in | Wt 253.0 lb

## 2023-07-06 DIAGNOSIS — E782 Mixed hyperlipidemia: Secondary | ICD-10-CM | POA: Diagnosis not present

## 2023-07-06 DIAGNOSIS — Z7984 Long term (current) use of oral hypoglycemic drugs: Secondary | ICD-10-CM | POA: Diagnosis not present

## 2023-07-06 DIAGNOSIS — Z7985 Long-term (current) use of injectable non-insulin antidiabetic drugs: Secondary | ICD-10-CM | POA: Diagnosis not present

## 2023-07-06 DIAGNOSIS — R809 Proteinuria, unspecified: Secondary | ICD-10-CM

## 2023-07-06 DIAGNOSIS — E1129 Type 2 diabetes mellitus with other diabetic kidney complication: Secondary | ICD-10-CM | POA: Diagnosis not present

## 2023-07-06 MED ORDER — METFORMIN HCL 500 MG PO TABS: 1000.0000 mg | ORAL_TABLET | Freq: Two times a day (BID) | ORAL | 1 refills | Status: DC

## 2023-07-06 NOTE — Progress Notes (Signed)
 Outpatient Endocrinology Note Jorge Newcomer, MD  07/06/23   Karen Miller 01-18-1961 [id number]  Referring Provider: Dorothe Gaster, NP Primary Care Provider: Dorothe Gaster, NP Reason for consultation: Subjective   Assessment & Plan  Diagnoses and all orders for this visit:  Type 2 diabetes mellitus with microalbuminuria (HCC) -     metFORMIN  (GLUCOPHAGE ) 500 MG tablet; Take 2 tablets (1,000 mg total) by mouth 2 (two) times daily with a meal. -     Microalbumin / creatinine urine ratio  Long term (current) use of oral hypoglycemic drugs  Long-term (current) use of injectable non-insulin  antidiabetic drugs  Mixed hypercholesterolemia and hypertriglyceridemia   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,  Lab Results  Component Value Date   GFR 65.98 12/31/2022   Hba1c goal less than 7, current Hba1c is  Lab Results  Component Value Date   HGBA1C 5.8 (A) 05/07/2023   Will recommend the following: Metformin  500mg  2 pills twice a day Ozempic  1 mg/week (couldn't tolerate 2 mg weekly due to not being able to eat/drink and abdominal pain)   Educated on risks and side effects of SGLT-2 inhibitors including but not limited to yeast infections, urinary tract infections, dehydration, hyperkalemia, euglycemic DKA etc. Patient to notify us  in case of any side effects-will start if MA/Cr + We will start you on a medication called jardiance. This medicine is once daily and can have side effects of increased urination, dizziness on standing and urinary tract infections. Please be very careful with hygiene after you urinate to help prevent this.  If you have any worsening abdominal pain or fatigue or urinary infection while on this medication, stop it and let us  know.  DexCom was not covered  Stopped trulicity  due to body shakes  No known contraindications/side effects to any of above medications No history of MEN syndrome/medullary thyroid  cancer/pancreatitis or  pancreatic cancer in self or family  -Last LD and Tg are as follows: Lab Results  Component Value Date   LDLCALC 48 02/27/2023    Lab Results  Component Value Date   TRIG 158 (H) 02/27/2023   -On atorvastatin  40 mg QD -Follow low fat diet and exercise   -Blood pressure goal <140/90 - Microalbumin/creatinine goal is < 30 -Last MA/Cr is as follows: Lab Results  Component Value Date   MICROALBUR 4.0 02/27/2023   -on ACE/ARB Valsartan  320 mg/day -diet changes including salt restriction -limit eating outside -counseled BP targets per standards of diabetes care -uncontrolled blood pressure can lead to retinopathy, nephropathy and cardiovascular and atherosclerotic heart disease  Reviewed and counseled on: -A1C target -Blood sugar targets -Complications of uncontrolled diabetes  -Checking blood sugar before meals and bedtime and bring log next visit -All medications with mechanism of action and side effects -Hypoglycemia management: rule of 15's, Glucagon Emergency Kit and medical alert ID -low-carb low-fat plate-method diet -At least 20 minutes of physical activity per day -Annual dilated retinal eye exam and foot exam -compliance and follow up needs -follow up as scheduled or earlier if problem gets worse  Call if blood sugar is less than 70 or consistently above 250    Take a 15 gm snack of carbohydrate at bedtime before you go to sleep if your blood sugar is less than 100.    If you are going to fast after midnight for a test or procedure, ask your physician for instructions on how to reduce/decrease your insulin  dose.    Call if blood sugar is  less than 70 or consistently above 250  -Treating a low sugar by rule of 15  (15 gms of sugar every 15 min until sugar is more than 70) If you feel your sugar is low, test your sugar to be sure If your sugar is low (less than 70), then take 15 grams of a fast acting Carbohydrate (3-4 glucose tablets or glucose gel or 4 ounces of  juice or regular soda) Recheck your sugar 15 min after treating low to make sure it is more than 70 If sugar is still less than 70, treat again with 15 grams of carbohydrate          Don't drive the hour of hypoglycemia  If unconscious/unable to eat or drink by mouth, use glucagon injection or nasal spray baqsimi and call 911. Can repeat again in 15 min if still unconscious.  Return in about 3 months (around 10/05/2023).   I have reviewed current medications, nurse's notes, allergies, vital signs, past medical and surgical history, family medical history, and social history for this encounter. Counseled patient on symptoms, examination findings, lab findings, imaging results, treatment decisions and monitoring and prognosis. The patient understood the recommendations and agrees with the treatment plan. All questions regarding treatment plan were fully answered.  Jorge Newcomer, MD  07/06/23    History of Present Illness Karen Miller is a 63 y.o. year old female who presents for follow up of Type II diabetes mellitus.  Karen Miller was first diagnosed in 2022.   Diabetes education +  Home diabetes regimen: Metformin  500mg  every day Ozempic  1 mg/week since 2 wks  COMPLICATIONS -  MI/Stroke +  retinopathy -  neuropathy + nephropathy/microalbuminuria  BLOOD SUGAR DATA Checks intermittently before meals: 88-133  Physical Exam  BP 114/66   Pulse 96   Ht 5\' 5"  (1.651 m)   Wt 253 lb (114.8 kg)   SpO2 99%   BMI 42.10 kg/m    Constitutional: well developed, well nourished Head: normocephalic, atraumatic Eyes: sclera anicteric, no redness Neck: supple Lungs: normal respiratory effort Neurology: alert and oriented Skin: dry, no appreciable rashes Musculoskeletal: no appreciable defects Psychiatric: normal mood and affect Diabetic Foot Exam - Simple   No data filed      Current Medications Patient's Medications  New Prescriptions   No medications on file  Previous  Medications   AMLODIPINE  (NORVASC ) 5 MG TABLET    Take 1 tablet (5 mg total) by mouth daily.   ASPIRIN  EC (QC LO-DOSE ASPIRIN ) 81 MG TABLET    TAKE 1 TABLET DAILY - SWALLOW WHOLE   ATORVASTATIN  (LIPITOR) 40 MG TABLET    Take 1 tablet (40 mg total) by mouth daily.   CARVEDILOL  (COREG ) 12.5 MG TABLET    Take 1 tablet (12.5 mg total) by mouth 2 (two) times daily.   CHOLECALCIFEROL (VITAMIN D3) 25 MCG (1000 UNIT) TABLET    Take 2,000 Units by mouth daily.   CYANOCOBALAMIN (VITAMIN B12) 1000 MCG TABLET    Take 1,000 mcg by mouth daily.   FUROSEMIDE  (LASIX ) 20 MG TABLET    Take 1 tablet (20 mg total) by mouth daily.   HYDROCHLOROTHIAZIDE  (HYDRODIURIL ) 25 MG TABLET    Take 1 tablet (25 mg total) by mouth daily.   SEMAGLUTIDE , 2 MG/DOSE, (OZEMPIC , 2 MG/DOSE,) 8 MG/3ML SOPN    Inject 2 mg into the skin once a week.   VALSARTAN  (DIOVAN ) 320 MG TABLET    Take 1 tablet (320 mg total) by mouth  daily.  Modified Medications   Modified Medication Previous Medication   METFORMIN  (GLUCOPHAGE ) 500 MG TABLET metFORMIN  (GLUCOPHAGE ) 500 MG tablet      Take 2 tablets (1,000 mg total) by mouth 2 (two) times daily with a meal.    Take 2 tablets (1,000 mg total) by mouth 2 (two) times daily with a meal.  Discontinued Medications   No medications on file    Allergies Allergies  Allergen Reactions   Trulicity  [Dulaglutide ] Other (See Comments)    tremor    Past Medical History Past Medical History:  Diagnosis Date   Diabetes mellitus without complication (HCC)    on meds   Hyperlipidemia    on meds   Hypertension    on meds    Past Surgical History Past Surgical History:  Procedure Laterality Date   FACIAL FRACTURE SURGERY  2008   right side of the face completely plated-crushed her facial bones on that side   OTHER SURGICAL HISTORY  2008   right arm fractured in multiple areas-from right elbow down to right wrist plated and screws   UMBILICAL HERNIA REPAIR     has mesh   WISDOM TOOTH EXTRACTION       Family History family history includes Breast cancer (age of onset: 103) in her sister; Diabetes in her brother, mother, and sister; Heart attack in her sister; Hypertension in her sister; Other in her brother; Thyroid  disease in her sister and sister.  Social History Social History   Socioeconomic History   Marital status: Married    Spouse name: Not on file   Number of children: 2   Years of education: Not on file   Highest education level: Not on file  Occupational History   Not on file  Tobacco Use   Smoking status: Never   Smokeless tobacco: Never  Vaping Use   Vaping status: Never Used  Substance and Sexual Activity   Alcohol use: Never   Drug use: Never   Sexual activity: Not on file  Other Topics Concern   Not on file  Social History Narrative   Retired: worked for the state for 39      Consider rotating when you check your blood glucose (when you wake, 2 hours after breakfast or 2 hours after dinner)   Blood glucose goals:        Fasting:  80-130        2 hours after the start of a meal:  Less than 180        If your post meal reading is higher than 180, identify what you ate or other things that could have contributed              Social Drivers of Corporate investment banker Strain: Low Risk  (05/06/2023)   Overall Financial Resource Strain (CARDIA)    Difficulty of Paying Living Expenses: Not hard at all  Food Insecurity: No Food Insecurity (05/06/2023)   Hunger Vital Sign    Worried About Running Out of Food in the Last Year: Never true    Ran Out of Food in the Last Year: Never true  Transportation Needs: No Transportation Needs (05/06/2023)   PRAPARE - Administrator, Civil Service (Medical): No    Lack of Transportation (Non-Medical): No  Physical Activity: Unknown (05/06/2023)   Exercise Vital Sign    Days of Exercise per Week: 0 days    Minutes of Exercise per Session: Not on file  Stress:  No Stress Concern Present (05/06/2023)    [employer] of Occupational Health - Occupational Stress Questionnaire    Feeling of Stress : Not at all  Social Connections: Socially Integrated (05/06/2023)   Social Connection and Isolation Panel [NHANES]    Frequency of Communication with Friends and Family: Once a week    Frequency of Social Gatherings with Friends and Family: More than three times a week    Attends Religious Services: More than 4 times per year    Active Member of Clubs or Organizations: Yes    Attends Banker Meetings: Never    Marital Status: Married  Catering manager Violence: Not on file    Lab Results  Component Value Date   HGBA1C 5.8 (A) 05/07/2023   HGBA1C 6.9 (H) 02/27/2023   HGBA1C 6.9 (A) 12/31/2022   Lab Results  Component Value Date   CHOL 119 02/27/2023   Lab Results  Component Value Date   HDL 47 (L) 02/27/2023   Lab Results  Component Value Date   LDLCALC 48 02/27/2023   Lab Results  Component Value Date   TRIG 158 (H) 02/27/2023   Lab Results  Component Value Date   CHOLHDL 2.5 02/27/2023   Lab Results  Component Value Date   CREATININE 0.89 02/27/2023   Lab Results  Component Value Date   GFR 65.98 12/31/2022   Lab Results  Component Value Date   MICROALBUR 4.0 02/27/2023      Component Value Date/Time   NA 137 02/27/2023 0825   NA 139 01/16/2022 0822   NA 139 04/01/2012 0404   K 4.8 02/27/2023 0825   K 3.9 04/01/2012 0404   CL 99 02/27/2023 0825   CL 106 04/01/2012 0404   CO2 27 02/27/2023 0825   CO2 29 04/01/2012 0404   GLUCOSE 93 02/27/2023 0825   GLUCOSE 95 04/01/2012 0404   BUN 24 02/27/2023 0825   BUN 25 01/16/2022 0822   BUN 7 04/01/2012 0404   CREATININE 0.89 02/27/2023 0825   CALCIUM  9.7 02/27/2023 0825   CALCIUM  8.5 04/01/2012 0404   PROT 7.4 02/27/2023 0825   PROT 7.6 09/04/2021 0912   PROT 7.7 03/25/2012 0851   ALBUMIN 4.3 12/31/2022 0836   ALBUMIN 4.3 09/04/2021 0912   ALBUMIN 3.7 03/25/2012 0851   AST 14 02/27/2023 0825    AST 35 03/25/2012 0851   ALT 15 02/27/2023 0825   ALT 50 03/25/2012 0851   ALKPHOS 75 12/31/2022 0836   ALKPHOS 111 03/25/2012 0851   BILITOT 0.5 02/27/2023 0825   BILITOT 0.5 09/04/2021 0912   BILITOT 0.4 03/25/2012 0851   GFRNONAA >60 07/14/2021 0249   GFRNONAA >60 04/01/2012 0404   GFRAA >60 04/01/2012 0404      Latest Ref Rng & Units 02/27/2023    8:25 AM 12/31/2022    8:36 AM 06/20/2022   12:13 PM  BMP  Glucose 65 - 99 mg/dL 93  161  92   BUN 7 - 25 mg/dL 24  28  31    Creatinine 0.50 - 1.05 mg/dL 0.96  0.45  4.09   BUN/Creat Ratio 6 - 22 (calc) SEE NOTE:     Sodium 135 - 146 mmol/L 137  137  139   Potassium 3.5 - 5.3 mmol/L 4.8  4.6  4.9   Chloride 98 - 110 mmol/L 99  98  100   CO2 20 - 32 mmol/L 27  31  30    Calcium  8.6 - 10.4 mg/dL 9.7  9.7  9.7        Component Value Date/Time   WBC 8.4 12/31/2022 0836   RBC 4.14 12/31/2022 0836   HGB 12.3 12/31/2022 0836   HGB 12.6 04/01/2012 0404   HCT 37.8 12/31/2022 0836   HCT 37.6 04/01/2012 0404   PLT 328.0 12/31/2022 0836   PLT 293 04/01/2012 0404   MCV 91.3 12/31/2022 0836   MCV 93 04/01/2012 0404   MCH 29.8 07/15/2021 0407   MCHC 32.5 12/31/2022 0836   RDW 13.8 12/31/2022 0836   RDW 13.4 04/01/2012 0404   LYMPHSABS 1.6 04/01/2012 0404   MONOABS 0.8 04/01/2012 0404   EOSABS 0.1 04/01/2012 0404   BASOSABS 0.1 04/01/2012 0404     Parts of this note may have been dictated using voice recognition software. There may be variances in spelling and vocabulary which are unintentional. Not all errors are proofread. Please notify the [facility] if any discrepancies are noted or if the meaning of any statement is not clear.

## 2023-07-06 NOTE — Progress Notes (Signed)
 Subjective:    Patient ID: Karen Miller, female    DOB: 1960/11/09, 63 y.o.   MRN: [id number] Chief Complaint  Patient presents with   New Patient (Initial Visit)    NP. Consult + ABI  leg fatigue  ref [facility]    Patient is a 63 year old female who presents today for evaluation of bilateral lower extremity leg weakness.  She notes that doing things such as walking back and forth to her mailbox have become increasingly difficult for her.  She notes that she has to stop and take breaks in order to walk any significant distance.  This is made things such as grocery shopping difficult for her.  She also endorses having some lower extremity edema as well.  She is utilize medical grade compression socks as well as elevating her lower extremities.  She underwent ABIs today which show an ABI 1.04 on the right and 1.10 on the left.  She has multiphasic waveforms on the right but biphasic on the left.  Normal toe waveforms and TBI's bilaterally.    Review of Systems  Constitutional:  Positive for activity change.  Cardiovascular:  Positive for leg swelling.  Neurological:  Positive for weakness.  All other systems reviewed and are negative.      Objective:   Physical Exam Vitals reviewed.  HENT:     Head: Normocephalic.  Cardiovascular:     Rate and Rhythm: Normal rate.     Pulses: Normal pulses.  Pulmonary:     Effort: Pulmonary effort is normal.  Skin:    General: Skin is warm and dry.  Neurological:     Mental Status: She is alert and oriented to person, place, and time.  Psychiatric:        Mood and Affect: Mood normal.        Behavior: Behavior normal.        Thought Content: Thought content normal.        Judgment: Judgment normal.     BP 111/71   Pulse (!) 103   Resp 18   Ht 5\' 5"  (1.651 m)   Wt 253 lb (114.8 kg)   BMI 42.10 kg/m   Past Medical History:  Diagnosis Date   Diabetes mellitus without complication (HCC)    on meds   Hyperlipidemia    on  meds   Hypertension    on meds    Social History   Socioeconomic History   Marital status: Married    Spouse name: Not on file   Number of children: 2   Years of education: Not on file   Highest education level: Not on file  Occupational History   Not on file  Tobacco Use   Smoking status: Never   Smokeless tobacco: Never  Vaping Use   Vaping status: Never Used  Substance and Sexual Activity   Alcohol use: Never   Drug use: Never   Sexual activity: Not on file  Other Topics Concern   Not on file  Social History Narrative   Retired: worked for the state for 39      Consider rotating when you check your blood glucose (when you wake, 2 hours after breakfast or 2 hours after dinner)   Blood glucose goals:        Fasting:  80-130        2 hours after the start of a meal:  Less than 180        If your post meal reading  is higher than 180, identify what you ate or other things that could have contributed              Social Drivers of Corporate investment banker Strain: Low Risk  (05/06/2023)   Overall Financial Resource Strain (CARDIA)    Difficulty of Paying Living Expenses: Not hard at all  Food Insecurity: No Food Insecurity (05/06/2023)   Hunger Vital Sign    Worried About Running Out of Food in the Last Year: Never true    Ran Out of Food in the Last Year: Never true  Transportation Needs: No Transportation Needs (05/06/2023)   PRAPARE - Administrator, Civil Service (Medical): No    Lack of Transportation (Non-Medical): No  Physical Activity: Unknown (05/06/2023)   Exercise Vital Sign    Days of Exercise per Week: 0 days    Minutes of Exercise per Session: Not on file  Stress: No Stress Concern Present (05/06/2023)   [employer] of Occupational Health - Occupational Stress Questionnaire    Feeling of Stress : Not at all  Social Connections: Socially Integrated (05/06/2023)   Social Connection and Isolation Panel [NHANES]    Frequency of  Communication with Friends and Family: Once a week    Frequency of Social Gatherings with Friends and Family: More than three times a week    Attends Religious Services: More than 4 times per year    Active Member of [employer] or Organizations: Yes    Attends Banker Meetings: Never    Marital Status: Married  Catering manager Violence: Not on file    Past Surgical History:  Procedure Laterality Date   FACIAL FRACTURE SURGERY  2008   right side of the face completely plated-crushed her facial bones on that side   OTHER SURGICAL HISTORY  2008   right arm fractured in multiple areas-from right elbow down to right wrist plated and screws   UMBILICAL HERNIA REPAIR     has mesh   WISDOM TOOTH EXTRACTION      Family History  Problem Relation Age of Onset   Diabetes Mother    Diabetes Sister    Thyroid  disease Sister    Breast cancer Sister 46   Thyroid  disease Sister    Hypertension Sister    Heart attack Sister        x 2-stents   Diabetes Brother    Other Brother        enlarged heart and has defibrillator   Colon polyps Neg Hx    Colon cancer Neg Hx    Esophageal cancer Neg Hx    Rectal cancer Neg Hx    Stomach cancer Neg Hx     Allergies  Allergen Reactions   Trulicity  [Dulaglutide ] Other (See Comments)    tremor       Latest Ref Rng & Units 12/31/2022    8:36 AM 09/08/2022   12:36 PM 06/20/2022   12:13 PM  CBC  WBC 4.0 - 10.5 K/uL 8.4  9.8  8.8   Hemoglobin 12.0 - 15.0 g/dL 16.1  09.6  04.5   Hematocrit 36.0 - 46.0 % 37.8  36.2  35.8   Platelets 150.0 - 400.0 K/uL 328.0  326.0  340.0       CMP     Component Value Date/Time   NA 137 02/27/2023 0825   NA 139 01/16/2022 0822   NA 139 04/01/2012 0404   K 4.8 02/27/2023 0825   K 3.9  04/01/2012 0404   CL 99 02/27/2023 0825   CL 106 04/01/2012 0404   CO2 27 02/27/2023 0825   CO2 29 04/01/2012 0404   GLUCOSE 93 02/27/2023 0825   GLUCOSE 95 04/01/2012 0404   BUN 24 02/27/2023 0825   BUN 25  01/16/2022 0822   BUN 7 04/01/2012 0404   CREATININE 0.89 02/27/2023 0825   CALCIUM  9.7 02/27/2023 0825   CALCIUM  8.5 04/01/2012 0404   PROT 7.4 02/27/2023 0825   PROT 7.6 09/04/2021 0912   PROT 7.7 03/25/2012 0851   ALBUMIN 4.3 12/31/2022 0836   ALBUMIN 4.3 09/04/2021 0912   ALBUMIN 3.7 03/25/2012 0851   AST 14 02/27/2023 0825   AST 35 03/25/2012 0851   ALT 15 02/27/2023 0825   ALT 50 03/25/2012 0851   ALKPHOS 75 12/31/2022 0836   ALKPHOS 111 03/25/2012 0851   BILITOT 0.5 02/27/2023 0825   BILITOT 0.5 09/04/2021 0912   BILITOT 0.4 03/25/2012 0851   GFR 65.98 12/31/2022 0836   EGFR 80 01/16/2022 0822   GFRNONAA >60 07/14/2021 0249   GFRNONAA >60 04/01/2012 0404     VAS US  ABI WITH/WO TBI Result Date: 07/02/2023  LOWER EXTREMITY DOPPLER STUDY Patient Name:  EDUARDA RIZZOLO  Date of Exam:   07/01/2023 Medical Rec #: [id number]        Accession #:    [id number] Date of Birth: 12-31-1960        Patient Gender: F Patient Age:   37 years Exam Location:  [facility] Procedure:      VAS US  ABI WITH/WO TBI Referring Phys: Mikki Alexander --------------------------------------------------------------------------------  Indications: Claudication.  Performing Technologist: Tonie Franks RVS  Examination Guidelines: A complete evaluation includes at minimum, Doppler waveform signals and systolic blood pressure reading at the level of bilateral brachial, anterior tibial, and posterior tibial arteries, when vessel segments are accessible. Bilateral testing is considered an integral part of a complete examination. Photoelectric Plethysmograph (PPG) waveforms and toe systolic pressure readings are included as required and additional duplex testing as needed. Limited examinations for reoccurring indications may be performed as noted.  ABI Findings: +---------+------------------+-----+---------+--------+ Right    Rt Pressure (mmHg)IndexWaveform Comment   +---------+------------------+-----+---------+--------+ Brachial 167                                      +---------+------------------+-----+---------+--------+ ATA      150               0.89 biphasic          +---------+------------------+-----+---------+--------+ PTA      175               1.04 triphasic         +---------+------------------+-----+---------+--------+ Great Toe160               0.95 Normal            +---------+------------------+-----+---------+--------+ +---------+------------------+-----+--------+-------+ Left     Lt Pressure (mmHg)IndexWaveformComment +---------+------------------+-----+--------+-------+ Brachial 169                                    +---------+------------------+-----+--------+-------+ PTA      153               0.91 biphasic        +---------+------------------+-----+--------+-------+ PERO     183  1.10 biphasic        +---------+------------------+-----+--------+-------+ Great Toe151               0.89 Normal          +---------+------------------+-----+--------+-------+ +-------+-----------+-----------+------------+------------+ ABI/TBIToday's ABIToday's TBIPrevious ABIPrevious TBI +-------+-----------+-----------+------------+------------+ Right  1.04       .95                                 +-------+-----------+-----------+------------+------------+ Left   1.10       .89                                 +-------+-----------+-----------+------------+------------+  Summary: Right: Resting right ankle-brachial index is within normal range. The right toe-brachial index is normal. Left: Resting left ankle-brachial index is within normal range. The left toe-brachial index is normal. *See table(s) above for measurements and observations.  Electronically signed by Mikki Alexander MD on 07/02/2023 at 10:14:26 AM.    Final        Assessment & Plan:   1. Pain of left lower extremity (Primary) The  patient had normal ABIs but biphasic waveforms.  Her symptoms are concerning for possible peripheral arterial disease however given the location they may also be related to issues with her lower back.  Will have the patient undergo an aortoiliac duplex to notice if there is any significant stenosis in the iliac level vessels.  If not we will consider referral to evaluate lower spine.  2. Leg swelling In addition to leg fatigue the patient also has lower extremity edema.  This may be related to possible venous disease as sometimes leg fatigue can be related to venous insufficiency.  The patient undergo bilateral venous reflux upon her return.  She is advised to utilize medical grade compression garments as well as to elevate her lower extremities.  3. Type 2 diabetes mellitus with hyperlipidemia (HCC) Continue hypoglycemic medications as already ordered, these medications have been reviewed and there are no changes at this time.  Hgb A1C to be monitored as already arranged by primary service   Current Outpatient Medications on File Prior to Visit  Medication Sig Dispense Refill   amLODipine  (NORVASC ) 5 MG tablet Take 1 tablet (5 mg total) by mouth daily. 90 tablet 3   aspirin  EC (QC LO-DOSE ASPIRIN ) 81 MG tablet TAKE 1 TABLET DAILY - SWALLOW WHOLE 90 tablet 1   atorvastatin  (LIPITOR) 40 MG tablet Take 1 tablet (40 mg total) by mouth daily. 90 tablet 2   carvedilol  (COREG ) 12.5 MG tablet Take 1 tablet (12.5 mg total) by mouth 2 (two) times daily. 180 tablet 2   cholecalciferol (VITAMIN D3) 25 MCG (1000 UNIT) tablet Take 2,000 Units by mouth daily.     cyanocobalamin (VITAMIN B12) 1000 MCG tablet Take 1,000 mcg by mouth daily.     furosemide  (LASIX ) 20 MG tablet Take 1 tablet (20 mg total) by mouth daily. 90 tablet 3   hydrochlorothiazide  (HYDRODIURIL ) 25 MG tablet Take 1 tablet (25 mg total) by mouth daily. 90 tablet 1   metFORMIN  (GLUCOPHAGE ) 500 MG tablet Take 2 tablets (1,000 mg total) by mouth  2 (two) times daily with a meal. 120 tablet 4   Semaglutide , 2 MG/DOSE, (OZEMPIC , 2 MG/DOSE,) 8 MG/3ML SOPN Inject 2 mg into the skin once a week. (Patient taking differently: Inject 1 mg into the skin once a  week.) 9 mL 1   valsartan  (DIOVAN ) 320 MG tablet Take 1 tablet (320 mg total) by mouth daily. 90 tablet 3   No current facility-administered medications on file prior to visit.    There are no Patient Instructions on file for this visit. No follow-ups on file.   Marvin Maenza E Imanii Gosdin, NP

## 2023-07-06 NOTE — Patient Instructions (Signed)

## 2023-07-07 LAB — MICROALBUMIN / CREATININE URINE RATIO
Creatinine, Urine: 81 mg/dL (ref 20–275)
Microalb Creat Ratio: 23 mg/g{creat} (ref <30)
Microalb, Ur: 1.9 mg/dL

## 2023-07-09 ENCOUNTER — Encounter: Payer: Self-pay | Admitting: "Endocrinology

## 2023-07-17 ENCOUNTER — Encounter: Payer: Self-pay | Admitting: Nurse Practitioner

## 2023-07-17 ENCOUNTER — Ambulatory Visit: Attending: Nurse Practitioner | Admitting: Nurse Practitioner

## 2023-07-17 VITALS — BP 116/58 | HR 98 | Ht 65.0 in | Wt 250.8 lb

## 2023-07-17 DIAGNOSIS — R002 Palpitations: Secondary | ICD-10-CM

## 2023-07-17 DIAGNOSIS — I4729 Other ventricular tachycardia: Secondary | ICD-10-CM

## 2023-07-17 DIAGNOSIS — E785 Hyperlipidemia, unspecified: Secondary | ICD-10-CM

## 2023-07-17 DIAGNOSIS — I2510 Atherosclerotic heart disease of native coronary artery without angina pectoris: Secondary | ICD-10-CM

## 2023-07-17 DIAGNOSIS — R55 Syncope and collapse: Secondary | ICD-10-CM | POA: Diagnosis not present

## 2023-07-17 DIAGNOSIS — I10 Essential (primary) hypertension: Secondary | ICD-10-CM

## 2023-07-17 DIAGNOSIS — I471 Supraventricular tachycardia, unspecified: Secondary | ICD-10-CM

## 2023-07-17 DIAGNOSIS — E1169 Type 2 diabetes mellitus with other specified complication: Secondary | ICD-10-CM

## 2023-07-17 DIAGNOSIS — E66813 Obesity, class 3: Secondary | ICD-10-CM

## 2023-07-17 DIAGNOSIS — Z6841 Body Mass Index (BMI) 40.0 and over, adult: Secondary | ICD-10-CM

## 2023-07-17 DIAGNOSIS — I342 Nonrheumatic mitral (valve) stenosis: Secondary | ICD-10-CM

## 2023-07-17 DIAGNOSIS — I340 Nonrheumatic mitral (valve) insufficiency: Secondary | ICD-10-CM

## 2023-07-17 DIAGNOSIS — I3481 Nonrheumatic mitral (valve) annulus calcification: Secondary | ICD-10-CM

## 2023-07-17 NOTE — Patient Instructions (Signed)
 Medication Instructions:  Your physician recommends that you continue on your current medications as directed. Please refer to the Current Medication list given to you today.  *If you need a refill on your cardiac medications before your next appointment, please call your pharmacy*  Lab Work: NONE ordered at this time of appointment   Testing/Procedures: NONE ordered at this time of appointment   Follow-Up: At [facility], you and your health needs are our priority.  As part of our continuing mission to provide you with exceptional heart care, our providers are all part of one team.  This team includes your primary Cardiologist (physician) and Advanced Practice Providers or APPs (Physician Assistants and Nurse Practitioners) who all work together to provide you with the care you need, when you need it.  Your next appointment:   September 2025   Provider:   Euell Herrlich, MD    We recommend signing up for the patient portal called "MyChart".  Sign up information is provided on this After Visit Summary.  MyChart is used to connect with patients for Virtual Visits (Telemedicine).  Patients are able to view lab/test results, encounter notes, upcoming appointments, etc.  Non-urgent messages can be sent to your provider as well.   To learn more about what you can do with MyChart, go to [email or web address].

## 2023-07-17 NOTE — Progress Notes (Signed)
 Office Visit    Patient Name: Karen Miller Date of Encounter: 07/17/2023  Primary Care Provider:  Dorothe Gaster, NP Primary Cardiologist:  Euell Herrlich, MD  Chief Complaint    63 year old female with a history of CAD, chronic diastolic heart failure, hypertension, hyperlipidemia, reported paroxysmal atrial fibrillation (no documented history), PVCs, type 2 diabetes, and obesity who presents for follow-up related to palpitations and presyncope.   Past Medical History    Past Medical History:  Diagnosis Date   Diabetes mellitus without complication (HCC)    on meds   Hyperlipidemia    on meds   Hypertension    on meds   Past Surgical History:  Procedure Laterality Date   FACIAL FRACTURE SURGERY  2008   right side of the face completely plated-crushed her facial bones on that side   OTHER SURGICAL HISTORY  2008   right arm fractured in multiple areas-from right elbow down to right wrist plated and screws   UMBILICAL HERNIA REPAIR     has mesh   WISDOM TOOTH EXTRACTION      Allergies  Allergies  Allergen Reactions   Trulicity  [Dulaglutide ] Other (See Comments)    tremor     Labs/Other Studies Reviewed    The following studies were reviewed today:  Cardiac Studies & Procedures   ______________________________________________________________________________________________     ECHOCARDIOGRAM  ECHOCARDIOGRAM COMPLETE 07/07/2022  Narrative ECHOCARDIOGRAM REPORT    Patient Name:   Karen Miller Date of Exam: 07/07/2022 Medical Rec #:  [id number]       Height:       64.0 in Accession #:    [id number]      Weight:       261.1 lb Date of Birth:  Jul 23, 1960       BSA:          2.191 m Patient Age:    61 years        BP:           140/83 mmHg Patient Gender: F               HR:           83 bpm. Exam Location:  [address]  Procedure: 2D Echo, Color Doppler, Cardiac Doppler and Intracardiac Opacification Agent  Indications:    Chronic diastolic  heart failure (HCC) [W29.56]  History:        Patient has prior history of Echocardiogram examinations, most recent 07/13/2021. Risk Factors:Hypertension, Dyslipidemia and Diabetes.  Sonographer:    Joleen Navy RDCS Referring Phys: [id number] Euell Herrlich   Sonographer Comments: Technically difficult study due to poor echo windows. IMPRESSIONS   1. Left ventricular ejection fraction, by estimation, is 60 to 65%. The left ventricle has normal function. The left ventricle has no regional wall motion abnormalities. Left ventricular diastolic parameters are indeterminate. 2. Right ventricular systolic function is normal. The right ventricular size is normal. 3. Left atrial size was moderately dilated. 4. Gradients similar to TTE done 07/13/21 . The mitral valve is degenerative. Mild mitral valve regurgitation. Mild to moderate mitral stenosis. Severe mitral annular calcification. 5. The aortic valve is tricuspid. There is mild calcification of the aortic valve. Aortic valve regurgitation is not visualized. Aortic valve sclerosis is present, with no evidence of aortic valve stenosis. 6. The inferior vena cava is normal in size with greater than 50% respiratory variability, suggesting right atrial pressure of 3 mmHg.  FINDINGS Left Ventricle: Left ventricular ejection fraction,  by estimation, is 60 to 65%. The left ventricle has normal function. The left ventricle has no regional wall motion abnormalities. Definity  contrast agent was given IV to delineate the left ventricular endocardial borders. The left ventricular internal cavity size was normal in size. There is no left ventricular hypertrophy. Left ventricular diastolic parameters are indeterminate.  Right Ventricle: The right ventricular size is normal. No increase in right ventricular wall thickness. Right ventricular systolic function is normal.  Left Atrium: Left atrial size was moderately dilated.  Right Atrium: Right atrial size  was normal in size.  Pericardium: There is no evidence of pericardial effusion.  Mitral Valve: Gradients similar to TTE done 07/13/21. The mitral valve is degenerative in appearance. There is severe thickening of the mitral valve leaflet(s). There is severe calcification of the mitral valve leaflet(s). Severe mitral annular calcification. Mild mitral valve regurgitation. Mild to moderate mitral valve stenosis. MV peak gradient, 20.6 mmHg. The mean mitral valve gradient is 10.0 mmHg.  Tricuspid Valve: The tricuspid valve is normal in structure. Tricuspid valve regurgitation is mild . No evidence of tricuspid stenosis.  Aortic Valve: The aortic valve is tricuspid. There is mild calcification of the aortic valve. Aortic valve regurgitation is not visualized. Aortic valve sclerosis is present, with no evidence of aortic valve stenosis.  Pulmonic Valve: The pulmonic valve was normal in structure. Pulmonic valve regurgitation is not visualized. No evidence of pulmonic stenosis.  Aorta: The aortic root is normal in size and structure.  Venous: The inferior vena cava is normal in size with greater than 50% respiratory variability, suggesting right atrial pressure of 3 mmHg.  IAS/Shunts: No atrial level shunt detected by color flow Doppler.   LEFT VENTRICLE PLAX 2D LVIDd:         5.40 cm   Diastology LVIDs:         2.90 cm   LV e' medial:    4.91 cm/s LV PW:         1.20 cm   LV E/e' medial:  39.1 LV IVS:        1.30 cm   LV e' lateral:   7.27 cm/s LVOT diam:     2.20 cm   LV E/e' lateral: 26.4 LV SV:         92 LV SV Index:   42 LVOT Area:     3.80 cm   RIGHT VENTRICLE RV Basal diam:  2.80 cm RV Mid diam:    2.90 cm RV S prime:     11.60 cm/s  LEFT ATRIUM             Index        RIGHT ATRIUM           Index LA diam:        4.90 cm 2.24 cm/m   RA Area:     11.10 cm LA Vol (A2C):   59.3 ml 27.06 ml/m  RA Volume:   19.90 ml  9.08 ml/m LA Vol (A4C):   82.6 ml 37.70 ml/m LA Biplane  Vol: 71.0 ml 32.40 ml/m AORTIC VALVE LVOT Vmax:   133.00 cm/s LVOT Vmean:  85.700 cm/s LVOT VTI:    0.243 m  AORTA Ao Root diam: 2.70 cm  MITRAL VALVE MV Area (PHT): 4.06 cm     SHUNTS MV Area VTI:   1.74 cm     Systemic VTI:  0.24 m MV Peak grad:  20.6 mmHg    Systemic Diam: 2.20 cm MV  Mean grad:  10.0 mmHg MV Vmax:       2.27 m/s MV Vmean:      156.0 cm/s MV Decel Time: 187 msec MV E velocity: 192.00 cm/s MV A velocity: 215.00 cm/s MV E/A ratio:  0.89  Janelle Mediate MD Electronically signed by Janelle Mediate MD Signature Date/Time: 07/07/2022/9:18:20 AM    Final      CT SCANS  CT CORONARY MORPH W/CTA COR W/SCORE 07/15/2021  Addendum 07/15/2021  1:53 PM ADDENDUM REPORT: 07/15/2021 13:51  CLINICAL DATA:  22F with hypertension and elevated cardiac enzymes.  EXAM: Cardiac/Coronary  CT  TECHNIQUE: The patient was scanned on a [employer].  FINDINGS: A 120 kV prospective scan was triggered in the descending thoracic aorta at 111 HU's. Axial non-contrast 3 mm slices were carried out through the heart. The data set was analyzed on a dedicated work station and scored using the Agatson method. Gantry rotation speed was 250 msecs and collimation was .6 mm. No beta blockade and 0.8 mg of sl NTG was given. The 3D data set was reconstructed in 5% intervals of the 67-82 % of the R-R cycle. Diastolic phases were analyzed on a dedicated work station using MPR, MIP and VRT modes. The patient received 80 cc of contrast.  Aorta: Normal size. Ascending aorta 3.2 cm. Aortic atherosclerosis. No dissection.  Aortic Valve:  Trileaflet.  No calcifications.  Coronary Arteries:  Normal coronary origin.  Right dominance.  RCA is a large dominant artery that gives rise to PDA and PLVB. There is mild (25-49%) calcified plaque proximally.  Left main is a large artery that gives rise to LAD and LCX arteries. There is minimal (<25%) calcified plaque at the ostium  LAD is a  large vessel that has diffuse minimal (<25%) calcified plaque. There is a large D1 and small D2 with minimal (<25%) calcified plaque.  LCX is a non-dominant artery that gives rise to one large OM1 branch. There is minimal (<25%) calcified plaque proximally.  Coronary Calcium  Score:  Left main: 2.52  Left anterior descending artery: 125  Left circumflex artery: 46.7  Right coronary artery: 29.3  Total: 204  Percentile: 94th  Other findings:  Normal pulmonary vein drainage into the left atrium.  Normal let atrial appendage without a thrombus.  Normal size of the pulmonary artery.  Mitral annular calcification.  IMPRESSION: 1. Coronary calcium  score of 204. This was 94th percentile for age-, race-, and sex-matched controls.  2. Normal coronary origin with right dominance.  3. Minimal plaques in the left main, LAD, and left circumflex arteries. Mild plaque in the RCA. CAD-RADS 2.  4.  Mitral annular calcification.  5.  Aortic atherosclerosis  6. Consider noncardiac causes of chest pain. Recommend aggressive risk factor modification including LDL goal <70.  Maudine Sos, MD   Electronically Signed By: Maudine Sos M.D. On: 07/15/2021 13:51  Narrative EXAM: OVER-READ INTERPRETATION  CT CHEST  The following report is an over-read performed by radiologist Dr. Erica Hau of [facility] on 07/15/2021. This over-read does not include interpretation of cardiac or coronary anatomy or pathology. The coronary CTA interpretation by the cardiologist is attached.  COMPARISON:  CT a of the chest on 07/12/2021  FINDINGS: Vascular: No significant noncardiac vascular findings.  Mediastinum/Nodes: Visualized mediastinum and hilar regions demonstrate no lymphadenopathy or masses.  Lungs/Pleura: Visualized lungs show no evidence of pulmonary edema, consolidation, pneumothorax, nodule or pleural fluid.  Upper Abdomen: No acute  abnormality.  Musculoskeletal: No chest wall  mass or suspicious bone lesions identified.  IMPRESSION: No significant incidental findings.  Electronically Signed: By: Erica Hau M.D. On: 07/15/2021 13:08     ______________________________________________________________________________________________     Recent Labs: 12/31/2022: Hemoglobin 12.3; Platelets 328.0; TSH 2.54 02/27/2023: ALT 15; BUN 24; Creat 0.89; Potassium 4.8; Sodium 137  Recent Lipid Panel    Component Value Date/Time   CHOL 119 02/27/2023 0825   CHOL 125 06/09/2022 0846   TRIG 158 (H) 02/27/2023 0825   HDL 47 (L) 02/27/2023 0825   HDL 42 06/09/2022 0846   CHOLHDL 2.5 02/27/2023 0825   VLDL 34.4 12/31/2022 0836   LDLCALC 48 02/27/2023 0825    History of Present Illness    63 year old female with the above past medical history including CAD, chronic diastolic heart failure, hypertension, hyperlipidemia, reported paroxysmal atrial fibrillation (no documented history), PVCs, type 2 diabetes, and obesity.   She was  hospitalized in May 2023 in the setting of hypertensive urgency, acute diastolic heart failure. She was evaluated at urgent care prior to her hospitalization and was told that she "might be in atrial fibrillation."  EKG from urgent care visit was unavailable for review, therefore, no documented history of atrial fibrillation.  Echocardiogram showed EF 65 to 70%, hyperdynamic LV function, severe LVH, indeterminate diastolic parameters, degenerative mitral valve, mild mitral valve regurgitation, mild to moderate mitral stenosis, severe mitral annular calcification. Coronary CT angiogram revealed coronary calcium  score 204, 94th percentile for age, race, and sex-matched controls, minimal plaques in the left main, LAD, and left circumflex arteries, mild plaque in the RCA, mitral annular calcification, and aortic atherosclerosis. Given presence of MAC, blood cultures were obtained however, these were  negative. Therefore, TEE was not pursued given low suspicion for endocarditis with negative blood cultures.  She has struggled with difficult to control hypertension.  Repeat echocardiogram in 06/2022 showed EF 60 to 65%, normal LV function, no RWMA, normal RV systolic function, mild mitral valve regurgitation, mild to moderate mitral valve stenosis, severe MAC. She was last seen in the office on 06/04/2023 and noticed episodes of rapid palpitations with associated pre syncope. 14-day Zio patch (preliminary results) revealed predominantly sinus rhythm, first-degree AV block, 3 runs of VT, longest interval lasting 9 beats, 13 runs of SVT, longest episode lasting 11 beats, isolated PACs and PVCs.  She also reported leg discomfort with walking. ABIs in 06/2023 were normal but with biphasic waveforms.  She was referred to vascular surgery and is pending aortic iliac ultrasound, bilateral venous reflux study.   She presents today for follow-up.  Since her last visit she has been stable from a cardiac standpoint.  She denies any recurrent palpitations, denies any dizziness, presyncope or syncope. She is following with an endocrinologist for diabetes management.  She is on Ozempic , metformin  was also recently increased.  BP has been well-controlled.  She denies any chest pain, dyspnea, PND, orthopnea, weight gain.  Overall, she reports feeling well.    Home Medications    Current Outpatient Medications  Medication Sig Dispense Refill   amLODipine  (NORVASC ) 5 MG tablet Take 1 tablet (5 mg total) by mouth daily. 90 tablet 3   aspirin  EC (QC LO-DOSE ASPIRIN ) 81 MG tablet TAKE 1 TABLET DAILY - SWALLOW WHOLE 90 tablet 1   atorvastatin  (LIPITOR) 40 MG tablet Take 1 tablet (40 mg total) by mouth daily. 90 tablet 2   carvedilol  (COREG ) 12.5 MG tablet Take 1 tablet (12.5 mg total) by mouth 2 (two) times daily. 180 tablet 2  cholecalciferol (VITAMIN D3) 25 MCG (1000 UNIT) tablet Take 2,000 Units by mouth daily.      cyanocobalamin (VITAMIN B12) 1000 MCG tablet Take 1,000 mcg by mouth daily.     furosemide  (LASIX ) 20 MG tablet Take 1 tablet (20 mg total) by mouth daily. 90 tablet 3   hydrochlorothiazide  (HYDRODIURIL ) 25 MG tablet Take 1 tablet (25 mg total) by mouth daily. 90 tablet 1   metFORMIN  (GLUCOPHAGE ) 500 MG tablet Take 2 tablets (1,000 mg total) by mouth 2 (two) times daily with a meal. 360 tablet 1   Semaglutide , 2 MG/DOSE, (OZEMPIC , 2 MG/DOSE,) 8 MG/3ML SOPN Inject 2 mg into the skin once a week. (Patient taking differently: Inject 1 mg into the skin once a week.) 9 mL 1   valsartan  (DIOVAN ) 320 MG tablet Take 1 tablet (320 mg total) by mouth daily. 90 tablet 3   No current facility-administered medications for this visit.     Review of Systems    She denies chest pain, palpitations, dyspnea, pnd, orthopnea, n, v, dizziness, syncope, weight gain, or early satiety. All other systems reviewed and are otherwise negative except as noted above.   Physical Exam    VS:  BP (!) 116/58 (BP Location: Left Arm, Patient Position: Sitting)   Pulse 98   Ht 5\' 5"  (1.651 m)   Wt 250 lb 12.8 oz (113.8 kg)   SpO2 99%   BMI 41.74 kg/m   GEN: Well nourished, well developed, in no acute distress. HEENT: normal. Neck: Supple, no JVD, carotid bruits, or masses. Cardiac: RRR, no murmurs, rubs, or gallops. No clubbing, cyanosis, nonpitting bilateral lower extremity edema.  Radials/DP/PT 2+ and equal bilaterally.  Respiratory:  Respirations regular and unlabored, clear to auscultation bilaterally. GI: Soft, nontender, nondistended, BS + x 4. MS: no deformity or atrophy. Skin: warm and dry, no rash. Neuro:  Strength and sensation are intact. Psych: Normal affect.  Accessory Clinical Findings    ECG personally reviewed by me today -    - no EKG in office today.   Lab Results  Component Value Date   WBC 8.4 12/31/2022   HGB 12.3 12/31/2022   HCT 37.8 12/31/2022   MCV 91.3 12/31/2022   PLT 328.0  12/31/2022   Lab Results  Component Value Date   CREATININE 0.89 02/27/2023   BUN 24 02/27/2023   NA 137 02/27/2023   K 4.8 02/27/2023   CL 99 02/27/2023   CO2 27 02/27/2023   Lab Results  Component Value Date   ALT 15 02/27/2023   AST 14 02/27/2023   ALKPHOS 75 12/31/2022   BILITOT 0.5 02/27/2023   Lab Results  Component Value Date   CHOL 119 02/27/2023   HDL 47 (L) 02/27/2023   LDLCALC 48 02/27/2023   TRIG 158 (H) 02/27/2023   CHOLHDL 2.5 02/27/2023    Lab Results  Component Value Date   HGBA1C 5.8 (A) 05/07/2023    Assessment & Plan   1. Palpitations/NSVT/PSVT/presyncope/reported paroxysmal atrial fibrillation: No documented evidence of atrial fibrillation.  She is not on anticoagulation. 14-day Zio patch (preliminary results) in 06/2023 in the setting of palpitations, presyncope, revealed predominantly sinus rhythm, first-degree AV block, 3 runs of VT, longest interval lasting 9 beats, 13 runs of SVT, longest episode lasting 11 beats, isolated PACs and PVCs.  Denies any recent palpitations.  Denies any recurrent presyncope or syncope. She will continue to monitor her symptoms.  If she has increasing palpitations, consider increasing carvedilol  (would likely need to  de-escalate other antihypertensive medications), consider transitioning from carvedilol  to metoprolol  (again, would likely need to adjust other antihypertensive medications in the setting).  Reviewed ED precautions, vagal maneuvers.  Continue carvedilol .  2. Hypertension: BP well controlled. Continue current antihypertensive regimen.    3. CAD: Coronary CT angiogram revealed coronary calcium  score 204, 94th percentile for age, race, and sex-matched controls, minimal plaques in the left main, LAD, and left circumflex arteries, mild plaque in the RCA, mitral annular calcification, and aortic atherosclerosis. Stable with no anginal symptoms. No indication for ischemic evaluation. Continue aspirin , valsartan ,  carvedilol , amlodipine , hydrochlorothiazide , and Lipitor.  4. Chronic diastolic heart failure/leg pain/bilateral lower extremity edema:   Repeat echocardiogram in 06/2022 showed EF 60 to 65%, normal LV function, no RWMA, normal RV systolic function, mild mitral valve regurgitation, mild to moderate mitral valve stenosis, severe MAC.  She notes ongoing nonpitting bilateral lower extremity edema, well compensated on exam.  She has also had bilateral leg pain.  ABIs in 06/2023 were normal, but did show biphasic waveforms.  She was referred to vascular surgery and is pending aortic iliac ultrasound, bilateral venous reflux study.  Continue compression, elevation.  For now, continue Lasix  at current dose, continue carvedilol , valsartan , hydrochlorothiazide .    5. Valvular heart disease: Recent echo as above.  History of mild mitral valve regurgitation, mild to moderate mitral valve stenosis, severe MAC.  Prior blood cultures were negative.  TEE was not pursued.  She is asymptomatic.  Consider repeat echocardiogram as clinically indicated.    6. Hyperlipidemia: LDL was 48 in 02/2023.  Continue Lipitor.  7. Type 2 diabetes/obesity:  A1c was 5.8 in 04/2023.  She is following with endocrinology.  8. Disposition: Follow-up in 11/2023 with Dr. Nathen Balder, sooner if needed.      Jude Norton, NP 07/17/2023, 1:08 PM

## 2023-07-28 ENCOUNTER — Encounter (INDEPENDENT_AMBULATORY_CARE_PROVIDER_SITE_OTHER): Payer: Self-pay

## 2023-08-04 ENCOUNTER — Other Ambulatory Visit: Payer: Self-pay | Admitting: Nurse Practitioner

## 2023-08-04 DIAGNOSIS — E1159 Type 2 diabetes mellitus with other circulatory complications: Secondary | ICD-10-CM

## 2023-08-05 ENCOUNTER — Encounter: Payer: Self-pay | Admitting: Nurse Practitioner

## 2023-08-14 ENCOUNTER — Other Ambulatory Visit (INDEPENDENT_AMBULATORY_CARE_PROVIDER_SITE_OTHER): Payer: Self-pay | Admitting: Nurse Practitioner

## 2023-08-14 DIAGNOSIS — M7989 Other specified soft tissue disorders: Secondary | ICD-10-CM

## 2023-08-14 DIAGNOSIS — M79605 Pain in left leg: Secondary | ICD-10-CM

## 2023-08-18 ENCOUNTER — Ambulatory Visit (INDEPENDENT_AMBULATORY_CARE_PROVIDER_SITE_OTHER)

## 2023-08-18 ENCOUNTER — Encounter (INDEPENDENT_AMBULATORY_CARE_PROVIDER_SITE_OTHER): Payer: Self-pay | Admitting: Vascular Surgery

## 2023-08-18 ENCOUNTER — Ambulatory Visit (INDEPENDENT_AMBULATORY_CARE_PROVIDER_SITE_OTHER): Admitting: Vascular Surgery

## 2023-08-18 VITALS — BP 109/66 | HR 94 | Resp 18 | Ht 65.5 in | Wt 247.6 lb

## 2023-08-18 DIAGNOSIS — E1159 Type 2 diabetes mellitus with other circulatory complications: Secondary | ICD-10-CM

## 2023-08-18 DIAGNOSIS — M7989 Other specified soft tissue disorders: Secondary | ICD-10-CM

## 2023-08-18 DIAGNOSIS — I152 Hypertension secondary to endocrine disorders: Secondary | ICD-10-CM

## 2023-08-18 DIAGNOSIS — M79605 Pain in left leg: Secondary | ICD-10-CM

## 2023-08-18 DIAGNOSIS — E1169 Type 2 diabetes mellitus with other specified complication: Secondary | ICD-10-CM

## 2023-08-18 DIAGNOSIS — E785 Hyperlipidemia, unspecified: Secondary | ICD-10-CM

## 2023-08-18 NOTE — Assessment & Plan Note (Signed)
 blood pressure control important in reducing the progression of atherosclerotic disease. On appropriate oral medications.

## 2023-08-18 NOTE — Progress Notes (Signed)
 Patient ID: Karen Miller, female   DOB: Nov 04, 1960, 63 y.o.   MRN: [id number]  Chief Complaint  Patient presents with   Follow-up    Pt conv aorta iliac and ble venous reflux    HPI Karen Miller is a 63 y.o. female.  Patient returns in follow up.  She has had no major changes since her last visit a few weeks ago in our office.  We performed aortoiliac duplex as well as venous reflux studies today.  Her venous study was normal with no evidence of DVT, superficial thrombophlebitis, or significant venous reflux in either lower extremity.  Aortoiliac duplex showed some plaque in the common and external iliac arteries but no hemodynamically significant stenosis was seen in the aorta or iliac arteries on either side.  There were normal size of the aorta and iliac arteries as well.  No aneurysm or ectasia.   Past Medical History:  Diagnosis Date   Diabetes mellitus without complication (HCC)    on meds   Hyperlipidemia    on meds   Hypertension    on meds    Past Surgical History:  Procedure Laterality Date   FACIAL FRACTURE SURGERY  2008   right side of the face completely plated-crushed her facial bones on that side   OTHER SURGICAL HISTORY  2008   right arm fractured in multiple areas-from right elbow down to right wrist plated and screws   UMBILICAL HERNIA REPAIR     has mesh   WISDOM TOOTH EXTRACTION       Family History  Problem Relation Age of Onset   Diabetes Mother    Diabetes Sister    Thyroid  disease Sister    Breast cancer Sister 51   Thyroid  disease Sister    Hypertension Sister    Heart attack Sister        x 2-stents   Diabetes Brother    Other Brother        enlarged heart and has defibrillator   Colon polyps Neg Hx    Colon cancer Neg Hx    Esophageal cancer Neg Hx    Rectal cancer Neg Hx    Stomach cancer Neg Hx      Social History   Tobacco Use   Smoking status: Never   Smokeless tobacco: Never  Vaping Use   Vaping status: Never Used   Substance Use Topics   Alcohol use: Never   Drug use: Never     Allergies  Allergen Reactions   Trulicity  [Dulaglutide ] Other (See Comments)    tremor    Current Outpatient Medications  Medication Sig Dispense Refill   amLODipine  (NORVASC ) 5 MG tablet Take 1 tablet (5 mg total) by mouth daily. 90 tablet 3   aspirin  EC (QC LO-DOSE ASPIRIN ) 81 MG tablet TAKE 1 TABLET DAILY - SWALLOW WHOLE 90 tablet 1   atorvastatin  (LIPITOR) 40 MG tablet Take 1 tablet (40 mg total) by mouth daily. 90 tablet 2   carvedilol  (COREG ) 12.5 MG tablet Take 1 tablet (12.5 mg total) by mouth 2 (two) times daily. 180 tablet 2   cholecalciferol (VITAMIN D3) 25 MCG (1000 UNIT) tablet Take 2,000 Units by mouth daily.     cyanocobalamin (VITAMIN B12) 1000 MCG tablet Take 1,000 mcg by mouth daily.     furosemide  (LASIX ) 20 MG tablet Take 1 tablet (20 mg total) by mouth daily. 90 tablet 3   hydrochlorothiazide  (HYDRODIURIL ) 25 MG tablet Take 1 tablet (25 mg total)  by mouth daily. 90 tablet 0   metFORMIN  (GLUCOPHAGE ) 500 MG tablet Take 2 tablets (1,000 mg total) by mouth 2 (two) times daily with a meal. 360 tablet 1   Semaglutide , 2 MG/DOSE, (OZEMPIC , 2 MG/DOSE,) 8 MG/3ML SOPN Inject 2 mg into the skin once a week. (Patient taking differently: Inject 1 mg into the skin once a week.) 9 mL 1   valsartan  (DIOVAN ) 320 MG tablet Take 1 tablet (320 mg total) by mouth daily. 90 tablet 3   No current facility-administered medications for this visit.      REVIEW OF SYSTEMS (Negative unless checked)  Constitutional: [] Weight loss  [] Fever  [] Chills Cardiac: [] Chest pain   [] Chest pressure   [] Palpitations   [] Shortness of breath when laying flat   [] Shortness of breath at rest   [] Shortness of breath with exertion. Vascular:  [x] Pain in legs with walking   [] Pain in legs at rest   [] Pain in legs when laying flat   [x] Claudication   [] Pain in feet when walking  [] Pain in feet at rest  [] Pain in feet when laying flat    [] History of DVT   [] Phlebitis   [x] Swelling in legs   [] Varicose veins   [] Non-healing ulcers Pulmonary:   [] Uses home oxygen   [] Productive cough   [] Hemoptysis   [] Wheeze  [] COPD   [] Asthma Neurologic:  [] Dizziness  [] Blackouts   [] Seizures   [] History of stroke   [] History of TIA  [] Aphasia   [] Temporary blindness   [] Dysphagia   [] Weakness or numbness in arms   [] Weakness or numbness in legs Musculoskeletal:  [] Arthritis   [] Joint swelling   [x] Joint pain   [] Low back pain Hematologic:  [] Easy bruising  [] Easy bleeding   [] Hypercoagulable state   [] Anemic  [] Hepatitis Gastrointestinal:  [] Blood in stool   [] Vomiting blood  [] Gastroesophageal reflux/heartburn   [] Abdominal pain Genitourinary:  [] Chronic kidney disease   [] Difficult urination  [] Frequent urination  [] Burning with urination   [] Hematuria Skin:  [] Rashes   [] Ulcers   [] Wounds Psychological:  [] History of anxiety   []  History of major depression.    Physical Exam BP 109/66   Pulse 94   Resp 18   Ht 5' 5.5" (1.664 m)   Wt 247 lb 9.6 oz (112.3 kg)   BMI 40.58 kg/m  Gen:  WD/WN, NAD Head: [place]/AT, No temporalis wasting. Ear/Nose/Throat: Hearing grossly intact, nares w/o erythema or drainage, oropharynx w/o Erythema/Exudate Eyes: Conjunctiva clear, sclera non-icteric  Neck: trachea midline.  No JVD.  Pulmonary:  Good air movement, respirations not labored, no use of accessory muscles  Cardiac: RRR, no JVD Vascular:  Vessel Right Left  Radial Palpable Palpable                                    Musculoskeletal: M/S 5/5 throughout.  Extremities without ischemic changes.  No deformity or atrophy. Trace LE edema. Neurologic: Sensation grossly intact in extremities.  Symmetrical.  Speech is fluent. Motor exam as listed above. Psychiatric: Judgment intact, Mood & affect appropriate for pt's clinical situation. Dermatologic: No rashes or ulcers noted.  No cellulitis or open wounds.    Radiology No results  found.  Labs Recent Results (from the past 2160 hours)  VAS US  ABI WITH/WO TBI     Status: None   Collection Time: 07/01/23  8:30 AM  Result Value Ref Range   Right ABI 1.04  Left ABI 1.10   Microalbumin / creatinine urine ratio     Status: None   Collection Time: 07/06/23  8:53 AM  Result Value Ref Range   Creatinine, Urine 81 20 - 275 mg/dL   Microalb, Ur 1.9 mg/dL    Comment: Reference Range Not established    Microalb Creat Ratio 23 <30 mg/g creat    Comment: . The ADA defines abnormalities in albumin excretion as follows: Karen Miller Albuminuria Category        Result (mg/g creatinine) . Normal to Mildly increased   <30 Moderately increased         30-299  Severely increased           > OR = 300 . The ADA recommends that at least two of three specimens collected within a 3-6 month period be abnormal before considering a patient to be within a diagnostic category.     Assessment/Plan:  Leg swelling Her venous study was normal with no evidence of DVT, superficial thrombophlebitis, or significant venous reflux in either lower extremity.  Likely due predominantly to medical issues in size.  No role for venous intervention.  Type 2 diabetes mellitus with hyperlipidemia (HCC) blood glucose control important in reducing the progression of atherosclerotic disease. Also, involved in wound healing. On appropriate medications.   Hypertension associated with diabetes (HCC) blood pressure control important in reducing the progression of atherosclerotic disease. On appropriate oral medications.   Pain of left lower extremity Her venous study was normal with no evidence of DVT, superficial thrombophlebitis, or significant venous reflux in either lower extremity.  Aortoiliac duplex showed some plaque in the common and external iliac arteries but no hemodynamically significant stenosis was seen in the aorta or iliac arteries on either side.  There were normal size of the aorta and iliac  arteries as well.  No aneurysm or ectasia. Given these findings, I think the likelihood of her lower extremity pain and difficulty walking being vascular in origin are quite small.  We did discuss there was some plaque seen and monitoring this on an annual basis would be reasonable, but I just do not think that there is much yield from proceeding with angiography at this point.  I would be more suspicious of a neurogenic or musculoskeletal origin of her pain.      Karen Miller 08/18/2023, 9:34 AM   This note was created with Dragon medical transcription system.  Any errors from dictation are unintentional.

## 2023-08-18 NOTE — Assessment & Plan Note (Signed)
 Her venous study was normal with no evidence of DVT, superficial thrombophlebitis, or significant venous reflux in either lower extremity.  Aortoiliac duplex showed some plaque in the common and external iliac arteries but no hemodynamically significant stenosis was seen in the aorta or iliac arteries on either side.  There were normal size of the aorta and iliac arteries as well.  No aneurysm or ectasia. Given these findings, I think the likelihood of her lower extremity pain and difficulty walking being vascular in origin are quite small.  We did discuss there was some plaque seen and monitoring this on an annual basis would be reasonable, but I just do not think that there is much yield from proceeding with angiography at this point.  I would be more suspicious of a neurogenic or musculoskeletal origin of her pain.

## 2023-08-18 NOTE — Assessment & Plan Note (Signed)
 blood glucose control important in reducing the progression of atherosclerotic disease. Also, involved in wound healing. On appropriate medications.

## 2023-08-18 NOTE — Assessment & Plan Note (Signed)
 Her venous study was normal with no evidence of DVT, superficial thrombophlebitis, or significant venous reflux in either lower extremity.  Likely due predominantly to medical issues in size.  No role for venous intervention.

## 2023-08-24 ENCOUNTER — Other Ambulatory Visit: Payer: Self-pay | Admitting: Internal Medicine

## 2023-08-25 ENCOUNTER — Encounter: Payer: Self-pay | Admitting: Internal Medicine

## 2023-08-25 DIAGNOSIS — R002 Palpitations: Secondary | ICD-10-CM

## 2023-08-25 MED ORDER — CARVEDILOL 12.5 MG PO TABS: 12.5000 mg | ORAL_TABLET | Freq: Two times a day (BID) | ORAL | 3 refills | Status: AC

## 2023-09-22 ENCOUNTER — Encounter: Payer: Self-pay | Admitting: Internal Medicine

## 2023-09-23 NOTE — Telephone Encounter (Signed)
 This mychart message  was answered in  another mychart on 09/23/23.   Concern was addressed in a separate encounter. Recommend she continue to monitor heart rate and report resting heart rate consistently greater than 110 bpm. Otherwise continue current medications. Report any new symptoms.  Thank you-EM   ETTER [place] PA)

## 2023-09-24 NOTE — Telephone Encounter (Signed)
 Last read by Darice DELENA Console at 8:43PM on 09/23/2023.

## 2023-09-30 ENCOUNTER — Encounter: Payer: Self-pay | Admitting: "Endocrinology

## 2023-10-05 ENCOUNTER — Ambulatory Visit: Admitting: "Endocrinology

## 2023-10-05 ENCOUNTER — Encounter: Payer: Self-pay | Admitting: Internal Medicine

## 2023-10-05 ENCOUNTER — Ambulatory Visit: Payer: Self-pay | Admitting: Internal Medicine

## 2023-10-05 DIAGNOSIS — R002 Palpitations: Secondary | ICD-10-CM | POA: Diagnosis not present

## 2023-10-05 DIAGNOSIS — R000 Tachycardia, unspecified: Secondary | ICD-10-CM

## 2023-10-05 DIAGNOSIS — R55 Syncope and collapse: Secondary | ICD-10-CM | POA: Diagnosis not present

## 2023-10-05 NOTE — Telephone Encounter (Signed)
 Called and spoke to pt; per Dr. Acharya, advised pt to stop taking Amlodipine  and to follow up in clinic. Currently, soonest available appt with APP is on 10/26/23; pt would like to be seen sooner. Will call pt back with an available, sooner appointment if at all possible. Pt states she is retired and is available any day/time.  Called pt back to notify her of appointment made with DOD (Dr. Anner) on 10/07/23. Advised pt to keep a BP log and bring in on 10/07/23. She has an appt with Dr. Loni on 11/11/23; notified pt this may be canceled, depending on what Dr. Anner advises after office visit and she verbalized understanding of this.

## 2023-10-05 NOTE — Telephone Encounter (Signed)
 Called and spoke with patient who states her BP has been dropping lately. States when she leaves house for work in the morning, her legs feel weak. She reports falling yesterday morning (early hours, 2AM) while trying to get to recliner. Didn't hit head, didn't pass out, family nearby.  Reports 91/41 when her legs felt weak last week. HR is averaging 98-103bpm. Reports that she doesn't eat that much anymore but I drink water all day long. Says that her leg weakness has been going on for years, nothing acute, she just feels she's on too much antihypertensive medication. BP log provided: 7/28 117/62 7/23 97/46(AM) 7/9 105/62(afternoon)  Takes Amlodipine  in the afternoon with second dose of Carvedilol . She takes all other BP meds at 2:30AM when she wakes up to start her day. Denies recent weight loss, lethargy, shortness of breath, chest pain, or changes to lower extremity swelling. She describes leg weakness that quickly turns to dizziness and is now happening from the time it takes her to get from her car to her front door. She denies feeling that any of this is emergent, but wants Dr Loni to look over her medications and see if she can stop something to help her feel better. She will more closely monitor BP and HR in the meantime;states she doesn't feel an appt is needed right away. Recent vascular studies last month were both normal. Routing to [place] for input, perhaps we can stop one of her diuretics.

## 2023-10-07 ENCOUNTER — Ambulatory Visit: Attending: Cardiology | Admitting: Cardiology

## 2023-10-07 ENCOUNTER — Encounter: Payer: Self-pay | Admitting: Cardiology

## 2023-10-07 VITALS — BP 98/63 | HR 141 | Ht 65.0 in | Wt 235.0 lb

## 2023-10-07 DIAGNOSIS — R638 Other symptoms and signs concerning food and fluid intake: Secondary | ICD-10-CM | POA: Diagnosis not present

## 2023-10-07 DIAGNOSIS — E1159 Type 2 diabetes mellitus with other circulatory complications: Secondary | ICD-10-CM | POA: Diagnosis not present

## 2023-10-07 DIAGNOSIS — I5032 Chronic diastolic (congestive) heart failure: Secondary | ICD-10-CM | POA: Diagnosis not present

## 2023-10-07 DIAGNOSIS — R42 Dizziness and giddiness: Secondary | ICD-10-CM | POA: Diagnosis not present

## 2023-10-07 DIAGNOSIS — I509 Heart failure, unspecified: Secondary | ICD-10-CM

## 2023-10-07 DIAGNOSIS — I152 Hypertension secondary to endocrine disorders: Secondary | ICD-10-CM

## 2023-10-07 NOTE — Patient Instructions (Addendum)
 Keep hydrated   Medication Instructions:    Stop taking hydrochlorothiazide  Along with Amlodipine    Take Carvedilol  after you eat breakfast and after you eat dinner ( about 12 hrs apart)  Take the valsartan  about 6 hours after  carvedilol .( In the middle of both above dose.)    Tonight  take 1/2 dose  of your carvedilol  tonight Instruction If your blood pressure is equal or  less than 100 ( top number) then do not your next dose of cardiac medication. If your blood pressure is at 110 ( systolic) take 1/2 dose of your next  dose of medication   *If you need a refill on your cardiac medications before your next appointment, please call your pharmacy*   Lab Work: Not needed   Testing/Procedures: Not needed   Follow-Up: At [facility], you and your health needs are our priority.  As part of our continuing mission to provide you with exceptional heart care, we have created designated Provider Care Teams.  These Care Teams include your primary Cardiologist (physician) and Advanced Practice Providers (APPs -  Physician Assistants and Nurse Practitioners) who all work together to provide you with the care you need, when you need it.     Your next appointment:     Keep appt with Dr Loni  The format for your next appointment:   In Person  Provider:   Soyla DELENA Loni, MD   Other Instructions

## 2023-10-07 NOTE — Progress Notes (Signed)
 Cardiology Office Note:  .   Date:  10/11/2023  ID:  Karen Miller, DOB [birth year], MRN [id number] PCP: Wendee Lynwood HERO, NP  [facility] Providers Cardiologist:  Soyla DELENA Merck, MD     Chief Complaint  Patient presents with   Follow-up    DoD follow-up visit for hypotension and dizziness/palpitations    Patient Profile: .     Karen Miller is an obese 63 y.o. female withwith a PMH noted below who presents here for DOD walk-in visit.   Notable PMH: CAD-(Coronary CTA: CAC 204; minimal (plaque noted in the LM, LAD, LCx; mild RCA plaque) Hypertension with history of hypertensive urgency (Hypertensive Heart Disease with chronic HFpEF) .  Hospitalized May 2023 for hypertensive heart urgency and acute HFpEF PAF (although difficult to see documented history) => she was told at the urgent care that she had A-fib. DM-2; HLD leg workup within 8 PVCs Obesity    Karen Miller was last seen by Damien Braver, NP on 07/17/2023 for a 6 to 8-week follow-up  after being seen by Dr. Merck.  She had ordered a Zio patch monitor.  At the time of this visit the patient denied any recurrent palpitations.  No dizziness, syncope/near syncope or any other major cardiac symptoms.  No chest pain or pressure.  No PND orthopnea.  BP well-controlled. Discussed vagal maneuvers Discussed potentially increasing beta-blocker dose which would be mean that the other BP meds will need to be adjusted.  Subjective  Discussed the use of AI scribe software for clinical note transcription with the patient, who gave verbal consent to proceed.  History of Present Illness  Karen Miller is a 64 year old female with hypertension and atrial fibrillation who presents with dizziness and low blood pressure. She was referred by her primary cardiologist, Dr. Merck, and her nurse practitioner, Damien Braver, as she was unavailable for an appointment.    She experiences dizziness, particularly after taking carvedilol .  An episode occurred where she took carvedilol  at 2:10 AM and by 2:30 AM, she felt dizzy and fell. She has since stopped taking carvedilol  after another similar episode. Dizziness and weakness with other medications have significantly impacted her ability to walk and perform daily activities. => Dr. Acharya already recommended d/c amlodipine .   She has a history of hypertension and atrial fibrillation, managed with carvedilol , Diovan  (valsartan ), HCTZ, and Lasix . Recently, she stopped Norvasc  (amlodipine ) due to dizziness and low blood pressure. Blood pressure readings have varied, with some as low as 97/58 and heart rates from 98 to 110 bpm. Her heart rate increases with minimal activity, such as walking from the couch to the chair.  She has been experiencing itching all over her body, attributed to Ozempic , which she recently stopped. The itching began three weeks ago, typically starting the day after taking the medication. She previously tried Trulicity  but experienced shaking as a side effect.  There has been a significant decrease in her physical activity level since being hospitalized four years ago and starting her current medication regimen. She used to walk two miles a day but is now unable to do so.  She is weak & fatigued, but with what she can do, she denies chest pain, tightness, or shortness of breath.    Objective   Medications - Aspirin  81 mg daily - Carvedilol  twice a day - Norvasc  (NOT taking-recently stopped Dr. Merck) - HCTZ 25 mg every morning - Lasix  20 mg every morning - Valsartan  320 mg - Atorvastatin   40 mg daily - Stopped Ozempic  (was on 1 mg weekly)  Studies Reviewed: SABRA   EKG Interpretation Date/Time:  Wednesday October 07 2023 15:24:12 EDT Ventricular Rate:  141 PR Interval:  134 QRS Duration:  86 QT Interval:  334 QTC Calculation: 511 R Axis:   39  Text Interpretation: Sinus tachycardia with frequent Premature ventricular complexes When compared with ECG of  07-Oct-2023 15:20, ST no longer elevated in Inferior leads Confirmed by Anner Lenis ([id number]) on 10/11/2023 12:00:01 AM    Lab Results  Component Value Date   NA 137 02/27/2023   K 4.8 02/27/2023   CREATININE 0.89 02/27/2023   GFR 65.98 12/31/2022   GLUCOSE 93 02/27/2023   Lab Results  Component Value Date   CHOL 119 02/27/2023   HDL 47 (L) 02/27/2023   LDLCALC 48 02/27/2023   TRIG 158 (H) 02/27/2023   CHOLHDL 2.5 02/27/2023   Lab Results  Component Value Date   HGBA1C 5.8 (A) 05/07/2023    ECHO: EF 60 to 65%.  No RWMA.  Indeterminate DF, but moderately dilated LA.  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  Mild AoV calcification-sclerosis but no stenosis.  (07/07/2022)  Coronary CTA: CAC 204.  Minimal LM, LAD and LCx plaque with mild RCA plaque.  (5/7-10/2021)  ~14-day Zio patch (April 2025) Short episodes of nonsustained VT (3 runs-fastest was 9 beats-max rate 200 bpm, longest 9 beats average heart rate 115 bpm.), and  supraventricular tachycardia-SVT (13 runs: Fastest 8 beats max 203 bpm, longest 11 beats-average HR 143.  Rare isolated PACs and PVCs.  No atrial fibrillation:   Aortic Ultrasound: Aortic, CIA and EIA showed no evidence of abnormal dilation.  Largest aortic measurement 2.4 cm.  (08/20/2023) Lower extremity venous reflux study: No DVT or superficial thrombus bilaterally, no deep or superficial venous reflux.  (08/20/2023)  Risk Assessment/Calculations:         Physical Exam:    Orthostatic Vitals: Position BP HR  Supine 98/63 111  Seated 94/58 118  Initial standing 85/50 132  Standing #2 88/60 134   VS:  BP 98/63   Pulse (!) 141   Ht 5' 5 (1.651 m)   Wt 235 lb (106.6 kg)   BMI 39.11 kg/m    Wt Readings from Last 3 Encounters:  10/07/23 235 lb (106.6 kg)  08/18/23 247 lb 9.6 oz (112.3 kg)  07/17/23 250 lb 12.8 oz (113.8 kg)    GEN: Borderline morbidly obese but otherwise well nourished, well well-groomed in no  acute distress; she seems relatively healthy-appearing. NECK: No JVD; No carotid bruits CARDIAC: Normal S1, S2; RRR, no murmurs, rubs, gallops RESPIRATORY:  Clear to auscultation without rales, wheezing or rhonchi ; nonlabored, good air movement. ABDOMEN: Soft, non-tender, non-distended EXTREMITIES:  No edema; No deformity     ASSESSMENT AND PLAN: .    Hypotensive and tachycardic today, encourage adequate hydration, reduce p.m. dose of carvedilol  by one half tonight.  Stop HCTZ.  Change interval dosing of valsartan  to lunchtime in between the carvedilol  doses. Parameters given for when to hold BP meds versus take afterwards.  Problem List Items Addressed This Visit       Cardiology Problems   Chronic heart failure with preserved ejection fraction (HFpEF) (HCC) (Chronic)   Currently euvolemic on exam.  He is taking furosemide  20 mg daily.  Thinking is stop HCTZ especially in light of her orthostatic symptoms.  - Will continue Diovan  320 mg and carvedilol   12.5 mg twice daily with blood pressure goal to reduce Diovan  dose to 160 mg daily, however may also need to consider converting from carvedilol  to Lopressor  for better rate control -Continue Lasix  with DC HCTZ      Relevant Orders   EKG 12-Lead (Completed)   Hypertension associated with diabetes (HCC) (Chronic)   Hypotensive today with mild orthostatic changes.  Currently on both medications including amlodipine  5 mg daily which was just stopped, carvedilol  12.5 mg twice daily, valsartan  320 mg daily and HCTZ 25 mg daily along with furosemide  20 mg daily.  Current concern for dehydration - Continue valsartan  and and carvedilol  at current doses but stop HCTZ and agree with stopping amlodipine .. - Continue furosemide  but low threshold to hold for worsening dizziness - Take valsartan  at lunchtime, adjust dose based on BP readings. - Consider metoprolol  if heart rate control needed without BP reduction. - Reassess medication regimen in  3-4 weeks.  Avoid hyperglycemia as this could exacerbate dehydration.  - Currently not on Ozempic -stopped likely because of itching but also now with concerns for dehydration, would not be favorable to restart. Most recent A1c was 5.8.  Pretty well-controlled considering this is not currently on medication.        Other   Dehydration symptoms   Hypotensive and tachycardic as well as orthostatic. Suspected dehydration possibly contributing to hypotension and dizziness. Reduced oral intake noted. -Both Ozempic  and Norvasc  were discontinued to decrease diuresis and antihypertensive effect. - Stop HCTZ. - Encourage increased fluid intake. - Monitor hydration status.      Orthostatic dizziness - Primary   Dizziness and orthostatic symptoms Symptoms likely due to medication-induced hypotension. Blood pressure low, heart rate elevated as compensatory response. - Stop HCTZ. - Ensure adequate hydration (increase fluid intake by 3 cups a day) - Ozempic  and amlodipine  already stopped - Monitor blood pressure regularly, especially when dizzy.- Assess morning blood pressure before medications. - Hold blood pressure medication if BP < 100 mmHg. - Take half dose of carvedilol  if BP < 110 mmHg. - Take full dose of carvedilol  if BP > 120 mmHg. - Change valsartan  dosing to  lunchtime. - Continue carvedilol  12.5 mg twice daily (mostly because of tachycardia, but low threshold to convert to Lopressor )            Follow-Up: Return in about 5 weeks (around 11/11/2023) for [employer] with Dr. Loni as scheduled.  Total time spent: 22 min spent with patient + 20 min spent charting = 44 min I spent 44 minutes in the care of Karen Miller today including reviewing labs (1 minutes), reviewing studies (7 minutes), face to face time discussing treatment options (22), reviewing records from Dr. Loni, Damien Braver, NP (5 minutes), 9 minutes dictating, and documenting in the  encounter.     Signed, Alm MICAEL Clay, MD, MS Alm Clay, M.D., M.S. Interventional Chartered certified accountant  Pager # [phone]

## 2023-10-10 NOTE — Progress Notes (Incomplete)
 Cardiology Office Note:  .   Date:  10/11/2023  ID:  Karen Miller, DOB June 18, 1960, MRN [id number] PCP: Wendee Lynwood HERO, NP  [facility] Providers Cardiologist:  Soyla DELENA Merck, MD { Click to update primary MD,subspecialty MD or APP then REFRESH:1}    No chief complaint on file.   Patient Profile: .     Karen Miller is a *** 63 y.o. female *** with a PMH notable for *** who presents here for *** at the request of Wendee Lynwood HERO, NP.  Notable PMH: CAD-(Coronary CTA: CAC 204; minimal (plaque noted in the LM, LAD, LCx; mild RCA plaque)     Karen Miller was last seen by Damien Braver, NP on 07/17/2023  Subjective  Discussed the use of AI scribe software for clinical note transcription with the patient, who gave verbal consent to proceed.  History of Present Illness History of Present Illness Karen Miller is a 63 year old female with hypertension and atrial fibrillation who presents with dizziness and low blood pressure. She was referred by her primary cardiologist, Dr. Merck, and her nurse practitioner, Damien Braver, as she was unavailable for an appointment.    She experiences dizziness, particularly after taking carvedilol . An episode occurred where she took carvedilol  at 2:10 AM and by 2:30 AM, she felt dizzy and fell. She has since stopped taking carvedilol  after another similar episode. Dizziness and weakness with other medications have significantly impacted her ability to walk and perform daily activities. => Dr. Acharya already recommended d/c amlodipine .   She has a history of hypertension and atrial fibrillation, managed with carvedilol , Diovan  (valsartan ), HCTZ, and Lasix . Recently, she stopped Norvasc  (amlodipine ) due to dizziness and low blood pressure. Blood pressure readings have varied, with some as low as 97/58 and heart rates from 98 to 110 bpm. Her heart rate increases with minimal activity, such as walking from the couch to the chair.  She has been  experiencing itching all over her body, attributed to Ozempic , which she recently stopped. The itching began three weeks ago, typically starting the day after taking the medication. She previously tried Trulicity  but experienced shaking as a side effect.  There has been a significant decrease in her physical activity level since being hospitalized four years ago and starting her current medication regimen. She used to walk two miles a day but is now unable to do so.  She is weak & fatigued, but with what she can do, she denies chest pain, tightness, or shortness of breath.    Objective   Medications - Aspirin  81 mg daily - Carvedilol  twice a day - Norvasc  (NOT taking) - HCTZ 25 mg every morning - Lasix  20 mg every morning - Valsartan  320 mg - Atorvastatin  40 mg daily - Stopped Ozempic  (was on 1 mg weekly)  Studies Reviewed: SABRA   EKG Interpretation Date/Time:  Wednesday October 07 2023 15:24:12 EDT Ventricular Rate:  141 PR Interval:  134 QRS Duration:  86 QT Interval:  334 QTC Calculation: 511 R Axis:   39  Text Interpretation: Sinus tachycardia with frequent Premature ventricular complexes When compared with ECG of 07-Oct-2023 15:20, ST no longer elevated in Inferior leads Confirmed by Anner Lenis ([id number]) on 10/11/2023 12:00:01 AM    ECHO: *** CATH: *** MONITOR: *** .  No atrial fibrillation .  Short episodes of NSVT as described below, no patient diary triggers .  Short episodes of SVT, as described below, no patient diary triggers.  Patch Wear Time:  13 days and 10 hours (2025-04-01T14:46:09-398 to 2025-04-15T01:40:57-0400)   Patient had a min HR of 68 bpm, max HR of 203 bpm, and avg HR of 89 bpm. Predominant underlying rhythm was Sinus Rhythm. First Degree AV Block was present. 3 Ventricular Tachycardia runs occurred, the run with the fastest interval lasting 9 beats with a  max rate of 200 bpm, the longest lasting 9 beats with an avg rate of 115 bpm. 13 Supraventricular  Tachycardia runs occurred, the run with the fastest interval lasting 8 beats with a max rate of 203 bpm, the longest lasting 11 beats with an avg rate of  143 bpm. Isolated SVEs were rare (<1.0%), SVE Couplets were rare (<1.0%), and SVE Triplets were rare (<1.0%). Isolated VEs were rare (<1.0%, 1000), VE Couplets were rare (<1.0%, 13), and VE Triplets were rare (<1.0%, 1). Ventricular Bigeminy and  Trigeminy were present. CT: ***  Risk Assessment/Calculations:   {Does this patient have ATRIAL FIBRILLATION?[phone]} No BP recorded.  {Refresh Note OR Click here to enter BP  :1}***         Physical Exam:   VS:  Pulse (!) 141   Ht 5' 5 (1.651 m)   Wt 235 lb (106.6 kg)   BMI 39.11 kg/m    Wt Readings from Last 3 Encounters:  10/07/23 235 lb (106.6 kg)  08/18/23 247 lb 9.6 oz (112.3 kg)  07/17/23 250 lb 12.8 oz (113.8 kg)    GEN: Well nourished, well developed in no acute distress; *** NECK: No JVD; No carotid bruits CARDIAC: Normal S1, S2; RRR, no murmurs, rubs, gallops RESPIRATORY:  Clear to auscultation without rales, wheezing or rhonchi ; nonlabored, good air movement. ABDOMEN: Soft, non-tender, non-distended EXTREMITIES:  No edema; No deformity      ASSESSMENT AND PLAN: .    Problem List Items Addressed This Visit       Cardiology Problems   CHF (congestive heart failure) (HCC) - Primary   Relevant Orders   EKG 12-Lead (Completed)    Assessment and Plan Assessment & Plan Assessment and Plan Dizziness and orthostatic symptoms Symptoms likely due to medication-induced hypotension. Blood pressure low, heart rate elevated as compensatory response. - Stop amlodipine  and HCTZ. - Monitor blood pressure regularly, especially when dizzy. - Hold blood pressure medication if BP < 100 mmHg. - Take half dose of carvedilol  if BP < 110 mmHg. - Take full dose of carvedilol  if BP > 120 mmHg. - Take valsartan  at lunchtime. - Assess morning blood pressure before  medications.  Hypertension with medication-induced hypotension and polypharmacy Hypertension management complicated by polypharmacy and medication-induced hypotension. Carvedilol  for heart rate control, may contribute to hypotension. Valsartan  dose may need adjustment. - Stop amlodipine  and HCTZ. - Monitor blood pressure regularly. - Adjust carvedilol  dose based on BP readings, take twice daily. - Take valsartan  at lunchtime, adjust dose based on BP readings. - Consider metoprolol  if heart rate control needed without BP reduction. - Reassess medication regimen in 3-4 weeks.  Atrial fibrillation Atrial fibrillation with elevated heart rate, potentially due to missed carvedilol  doses. - Continue carvedilol , adjust dose based on BP readings. - Consider metoprolol  if carvedilol  adjustment ineffective. - Monitor heart rate regularly.  Suspected dehydration Suspected dehydration possibly contributing to hypotension and dizziness. Reduced oral intake noted. - Stop HCTZ. - Encourage increased fluid intake. - Monitor hydration status.  Generalized pruritus secondary to Ozempic  Pruritus likely secondary to Ozempic . Discontinued due to side effects. - Discontinue Ozempic . - Consult diabetic  doctor for alternative diabetes management.  Recording duration: 21 minutes       {Are you ordering a CV Procedure (e.g. stress test, cath, DCCV, TEE, etc)?   Press F2        :[id number]}   Follow-Up: No follow-ups on file.  Total time spent: *** min spent with patient + *** min spent charting = *** min    Signed, Alm MICAEL Clay, MD, MS Alm Clay, M.D., M.S. Interventional Chartered certified accountant  Pager # [phone]

## 2023-10-11 ENCOUNTER — Encounter: Payer: Self-pay | Admitting: Cardiology

## 2023-10-11 DIAGNOSIS — R638 Other symptoms and signs concerning food and fluid intake: Secondary | ICD-10-CM | POA: Insufficient documentation

## 2023-10-11 DIAGNOSIS — R42 Dizziness and giddiness: Secondary | ICD-10-CM | POA: Insufficient documentation

## 2023-10-11 NOTE — Assessment & Plan Note (Addendum)
 Hypotensive today with mild orthostatic changes.  Currently on both medications including amlodipine  5 mg daily which was just stopped, carvedilol  12.5 mg twice daily, valsartan  320 mg daily and HCTZ 25 mg daily along with furosemide  20 mg daily.  Current concern for dehydration - Continue valsartan  and and carvedilol  at current doses but stop HCTZ and agree with stopping amlodipine .. - Continue furosemide  but low threshold to hold for worsening dizziness - Take valsartan  at lunchtime, adjust dose based on BP readings. - Consider metoprolol  if heart rate control needed without BP reduction. - Reassess medication regimen in 3-4 weeks.  Avoid hyperglycemia as this could exacerbate dehydration.  - Currently not on Ozempic -stopped likely because of itching but also now with concerns for dehydration, would not be favorable to restart. Most recent A1c was 5.8.  Pretty well-controlled considering this is not currently on medication.

## 2023-10-11 NOTE — Assessment & Plan Note (Signed)
 Currently euvolemic on exam.  He is taking furosemide  20 mg daily.  Thinking is stop HCTZ especially in light of her orthostatic symptoms.  - Will continue Diovan  320 mg and carvedilol  12.5 mg twice daily with blood pressure goal to reduce Diovan  dose to 160 mg daily, however may also need to consider converting from carvedilol  to Lopressor  for better rate control -Continue Lasix  with DC HCTZ

## 2023-10-11 NOTE — Assessment & Plan Note (Signed)
 Hypotensive and tachycardic as well as orthostatic. Suspected dehydration possibly contributing to hypotension and dizziness. Reduced oral intake noted. -Both Ozempic  and Norvasc  were discontinued to decrease diuresis and antihypertensive effect. - Stop HCTZ. - Encourage increased fluid intake. - Monitor hydration status.

## 2023-10-11 NOTE — Assessment & Plan Note (Signed)
 Dizziness and orthostatic symptoms Symptoms likely due to medication-induced hypotension. Blood pressure low, heart rate elevated as compensatory response. - Stop HCTZ. - Ensure adequate hydration (increase fluid intake by 3 cups a day) - Ozempic  and amlodipine  already stopped - Monitor blood pressure regularly, especially when dizzy.- Assess morning blood pressure before medications. - Hold blood pressure medication if BP < 100 mmHg. - Take half dose of carvedilol  if BP < 110 mmHg. - Take full dose of carvedilol  if BP > 120 mmHg. - Change valsartan  dosing to  lunchtime. - Continue carvedilol  12.5 mg twice daily (mostly because of tachycardia, but low threshold to convert to Lopressor )

## 2023-10-21 ENCOUNTER — Ambulatory Visit: Admitting: "Endocrinology

## 2023-10-21 ENCOUNTER — Encounter: Payer: Self-pay | Admitting: "Endocrinology

## 2023-10-21 VITALS — BP 140/80 | HR 84 | Ht 65.0 in | Wt 251.0 lb

## 2023-10-21 DIAGNOSIS — Z7984 Long term (current) use of oral hypoglycemic drugs: Secondary | ICD-10-CM

## 2023-10-21 DIAGNOSIS — E1129 Type 2 diabetes mellitus with other diabetic kidney complication: Secondary | ICD-10-CM

## 2023-10-21 DIAGNOSIS — E782 Mixed hyperlipidemia: Secondary | ICD-10-CM | POA: Diagnosis not present

## 2023-10-21 DIAGNOSIS — R809 Proteinuria, unspecified: Secondary | ICD-10-CM | POA: Diagnosis not present

## 2023-10-21 LAB — POCT GLYCOSYLATED HEMOGLOBIN (HGB A1C): Hemoglobin A1C: 5 % (ref 4.0–5.6)

## 2023-10-21 MED ORDER — EMPAGLIFLOZIN 10 MG PO TABS: 10.0000 mg | ORAL_TABLET | Freq: Every day | ORAL | 1 refills | Status: AC

## 2023-10-21 NOTE — Progress Notes (Signed)
 Outpatient Endocrinology Note Karen Birmingham, MD  10/21/23   Karen Miller 03/20/1960 [id number]  Referring Provider: Wendee Lynwood HERO, NP Primary Care Provider: Wendee Lynwood HERO, NP Reason for consultation: Subjective   Assessment & Plan  Diagnoses and all orders for this visit:  Type 2 diabetes mellitus with microalbuminuria (HCC) -     POCT glycosylated hemoglobin (Hb A1C) -     Lipid panel -     Comprehensive metabolic panel with GFR  Long term (current) use of oral hypoglycemic drugs  Mixed hypercholesterolemia and hypertriglyceridemia  Other orders -     empagliflozin  (JARDIANCE ) 10 MG TABS tablet; Take 1 tablet (10 mg total) by mouth daily before breakfast.   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,  Lab Results  Component Value Date   GFR 65.98 12/31/2022   Hba1c goal less than 7, current Hba1c is  Lab Results  Component Value Date   HGBA1C 5.0 10/21/2023   Will recommend the following: Metformin  500mg  2 pills twice a day Jardiance  10 mg every day if BG>150-180  Quit Ozempic  1 mg/week due to itching, couldn't eat/drink, dehydration/falls  (couldn't tolerate 2 mg weekly due to not being able to eat/drink and abdominal pain)  Educated on risks and side effects of SGLT-2 inhibitors including but not limited to yeast infections, urinary tract infections, dehydration, hyperkalemia, euglycemic DKA etc. Patient to notify us  in case of any side effects We will start you on a medication called jardiance . This medicine is once daily and can have side effects of increased urination, dizziness on standing and urinary tract infections. Please be very careful with hygiene after you urinate to help prevent this.  If you have any worsening abdominal pain or fatigue or urinary infection while on this medication, stop it and let us  know.  DexCom was not covered  Stopped trulicity  due to body shakes  No known contraindications/side effects to any of above  medications No history of MEN syndrome/medullary thyroid  cancer/pancreatitis or pancreatic cancer in self or family  -Last LD and Tg are as follows: Lab Results  Component Value Date   LDLCALC 48 02/27/2023    Lab Results  Component Value Date   TRIG 158 (H) 02/27/2023   -On atorvastatin  40 mg QD -Follow low fat diet and exercise   -Blood pressure goal <140/90 - Microalbumin/creatinine goal is < 30 -Last MA/Cr is as follows: Lab Results  Component Value Date   MICROALBUR 1.9 07/06/2023   -on ACE/ARB Valsartan  320 mg/day -diet changes including salt restriction -limit eating outside -counseled BP targets per standards of diabetes care -uncontrolled blood pressure can lead to retinopathy, nephropathy and cardiovascular and atherosclerotic heart disease  Reviewed and counseled on: -A1C target -Blood sugar targets -Complications of uncontrolled diabetes  -Checking blood sugar before meals and bedtime and bring log next visit -All medications with mechanism of action and side effects -Hypoglycemia management: rule of 15's, Glucagon Emergency Kit and medical alert ID -low-carb low-fat plate-method diet -At least 20 minutes of physical activity per day -Annual dilated retinal eye exam and foot exam -compliance and follow up needs -follow up as scheduled or earlier if problem gets worse  Call if blood sugar is less than 70 or consistently above 250    Take a 15 gm snack of carbohydrate at bedtime before you go to sleep if your blood sugar is less than 100.    If you are going to fast after midnight for a test or procedure, ask your  physician for instructions on how to reduce/decrease your insulin  dose.    Call if blood sugar is less than 70 or consistently above 250  -Treating a low sugar by rule of 15  (15 gms of sugar every 15 min until sugar is more than 70) If you feel your sugar is low, test your sugar to be sure If your sugar is low (less than 70), then take 15 grams  of a fast acting Carbohydrate (3-4 glucose tablets or glucose gel or 4 ounces of juice or regular soda) Recheck your sugar 15 min after treating low to make sure it is more than 70 If sugar is still less than 70, treat again with 15 grams of carbohydrate          Don't drive the hour of hypoglycemia  If unconscious/unable to eat or drink by mouth, use glucagon injection or nasal spray baqsimi and call 911. Can repeat again in 15 min if still unconscious.  Return in about 3 months (around 01/21/2024) for visit, labs today.   I have reviewed current medications, nurse's notes, allergies, vital signs, past medical and surgical history, family medical history, and social history for this encounter. Counseled patient on symptoms, examination findings, lab findings, imaging results, treatment decisions and monitoring and prognosis. The patient understood the recommendations and agrees with the treatment plan. All questions regarding treatment plan were fully answered.  Karen Birmingham, MD  10/21/23  History of Present Illness Karen Miller is a 63 y.o. year old female who presents for follow up of Type II diabetes mellitus.  Karen Miller was first diagnosed in 2022.   Diabetes education +  Home diabetes regimen: Metformin  500mg  every day  Stopped Ozempic  1 mg/week  COMPLICATIONS -  MI/Stroke +  retinopathy -  neuropathy + nephropathy/microalbuminuria  BLOOD SUGAR DATA Checks intermittently before meals: 102-159  Physical Exam  BP (!) 140/80   Pulse 84   Ht 5' 5 (1.651 m)   Wt 251 lb (113.9 kg)   SpO2 96%   BMI 41.77 kg/m    Constitutional: well developed, well nourished Head: normocephalic, atraumatic Eyes: sclera anicteric, no redness Neck: supple Lungs: normal respiratory effort Neurology: alert and oriented Skin: dry, no appreciable rashes Musculoskeletal: no appreciable defects Psychiatric: normal mood and affect Diabetic Foot Exam - Simple   No data filed       Current Medications Patient's Medications  New Prescriptions   EMPAGLIFLOZIN  (JARDIANCE ) 10 MG TABS TABLET    Take 1 tablet (10 mg total) by mouth daily before breakfast.  Previous Medications   ASPIRIN  EC (QC LO-DOSE ASPIRIN ) 81 MG TABLET    TAKE 1 TABLET DAILY - SWALLOW WHOLE   ATORVASTATIN  (LIPITOR) 40 MG TABLET    Take 1 tablet (40 mg total) by mouth daily.   CARVEDILOL  (COREG ) 12.5 MG TABLET    Take 1 tablet (12.5 mg total) by mouth 2 (two) times daily.   CHOLECALCIFEROL (VITAMIN D3) 25 MCG (1000 UNIT) TABLET    Take 2,000 Units by mouth daily.   CYANOCOBALAMIN (VITAMIN B12) 1000 MCG TABLET    Take 1,000 mcg by mouth daily.   FUROSEMIDE  (LASIX ) 20 MG TABLET    Take 1 tablet (20 mg total) by mouth daily.   METFORMIN  (GLUCOPHAGE ) 500 MG TABLET    Take 2 tablets (1,000 mg total) by mouth 2 (two) times daily with a meal.   VALSARTAN  (DIOVAN ) 320 MG TABLET    Take 1 tablet (320 mg total) by mouth  daily.  Modified Medications   No medications on file  Discontinued Medications   SEMAGLUTIDE , 2 MG/DOSE, (OZEMPIC , 2 MG/DOSE,) 8 MG/3ML SOPN    Inject 2 mg into the skin once a week.    Allergies Allergies  Allergen Reactions   Trulicity  [Dulaglutide ] Other (See Comments)    tremor    Past Medical History Past Medical History:  Diagnosis Date   Diabetes mellitus without complication (HCC)    on meds   Hyperlipidemia    on meds   Hypertension    on meds    Past Surgical History Past Surgical History:  Procedure Laterality Date   FACIAL FRACTURE SURGERY  2008   right side of the face completely plated-crushed her facial bones on that side   OTHER SURGICAL HISTORY  2008   right arm fractured in multiple areas-from right elbow down to right wrist plated and screws   UMBILICAL HERNIA REPAIR     has mesh   WISDOM TOOTH EXTRACTION      Family History family history includes Breast cancer (age of onset: 1) in her sister; Diabetes in her brother, mother, and sister; Heart  attack in her sister; Hypertension in her sister; Other in her brother; Thyroid  disease in her sister and sister.  Social History Social History   Socioeconomic History   Marital status: Married    Spouse name: Not on file   Number of children: 2   Years of education: Not on file   Highest education level: Not on file  Occupational History   Not on file  Tobacco Use   Smoking status: Never   Smokeless tobacco: Never  Vaping Use   Vaping status: Never Used  Substance and Sexual Activity   Alcohol use: Never   Drug use: Never   Sexual activity: Not on file  Other Topics Concern   Not on file  Social History Narrative   Retired: worked for the state for 39      Consider rotating when you check your blood glucose (when you wake, 2 hours after breakfast or 2 hours after dinner)   Blood glucose goals:        Fasting:  80-130        2 hours after the start of a meal:  Less than 180        If your post meal reading is higher than 180, identify what you ate or other things that could have contributed              Social Drivers of Corporate investment banker Strain: Low Risk  (05/06/2023)   Overall Financial Resource Strain (CARDIA)    Difficulty of Paying Living Expenses: Not hard at all  Food Insecurity: No Food Insecurity (05/06/2023)   Hunger Vital Sign    Worried About Running Out of Food in the Last Year: Never true    Ran Out of Food in the Last Year: Never true  Transportation Needs: No Transportation Needs (05/06/2023)   PRAPARE - Administrator, Civil Service (Medical): No    Lack of Transportation (Non-Medical): No  Physical Activity: Unknown (05/06/2023)   Exercise Vital Sign    Days of Exercise per Week: 0 days    Minutes of Exercise per Session: Not on file  Stress: No Stress Concern Present (05/06/2023)   [employer] of Occupational Health - Occupational Stress Questionnaire    Feeling of Stress : Not at all  Social Connections: Socially  Integrated (05/06/2023)  Social Connection and Isolation Panel    Frequency of Communication with Friends and Family: Once a week    Frequency of Social Gatherings with Friends and Family: More than three times a week    Attends Religious Services: More than 4 times per year    Active Member of Clubs or Organizations: Yes    Attends Banker Meetings: Never    Marital Status: Married  Catering manager Violence: Not on file    Lab Results  Component Value Date   HGBA1C 5.0 10/21/2023   HGBA1C 5.8 (A) 05/07/2023   HGBA1C 6.9 (H) 02/27/2023   Lab Results  Component Value Date   CHOL 119 02/27/2023   Lab Results  Component Value Date   HDL 47 (L) 02/27/2023   Lab Results  Component Value Date   LDLCALC 48 02/27/2023   Lab Results  Component Value Date   TRIG 158 (H) 02/27/2023   Lab Results  Component Value Date   CHOLHDL 2.5 02/27/2023   Lab Results  Component Value Date   CREATININE 0.89 02/27/2023   Lab Results  Component Value Date   GFR 65.98 12/31/2022   Lab Results  Component Value Date   MICROALBUR 1.9 07/06/2023      Component Value Date/Time   NA 137 02/27/2023 0825   NA 139 01/16/2022 0822   NA 139 04/01/2012 0404   K 4.8 02/27/2023 0825   K 3.9 04/01/2012 0404   CL 99 02/27/2023 0825   CL 106 04/01/2012 0404   CO2 27 02/27/2023 0825   CO2 29 04/01/2012 0404   GLUCOSE 93 02/27/2023 0825   GLUCOSE 95 04/01/2012 0404   BUN 24 02/27/2023 0825   BUN 25 01/16/2022 0822   BUN 7 04/01/2012 0404   CREATININE 0.89 02/27/2023 0825   CALCIUM  9.7 02/27/2023 0825   CALCIUM  8.5 04/01/2012 0404   PROT 7.4 02/27/2023 0825   PROT 7.6 09/04/2021 0912   PROT 7.7 03/25/2012 0851   ALBUMIN 4.3 12/31/2022 0836   ALBUMIN 4.3 09/04/2021 0912   ALBUMIN 3.7 03/25/2012 0851   AST 14 02/27/2023 0825   AST 35 03/25/2012 0851   ALT 15 02/27/2023 0825   ALT 50 03/25/2012 0851   ALKPHOS 75 12/31/2022 0836   ALKPHOS 111 03/25/2012 0851   BILITOT 0.5  02/27/2023 0825   BILITOT 0.5 09/04/2021 0912   BILITOT 0.4 03/25/2012 0851   GFRNONAA >60 07/14/2021 0249   GFRNONAA >60 04/01/2012 0404   GFRAA >60 04/01/2012 0404      Latest Ref Rng & Units 02/27/2023    8:25 AM 12/31/2022    8:36 AM 06/20/2022   12:13 PM  BMP  Glucose 65 - 99 mg/dL 93  879  92   BUN 7 - 25 mg/dL 24  28  31    Creatinine 0.50 - 1.05 mg/dL 9.10  9.06  8.95   BUN/Creat Ratio 6 - 22 (calc) SEE NOTE:     Sodium 135 - 146 mmol/L 137  137  139   Potassium 3.5 - 5.3 mmol/L 4.8  4.6  4.9   Chloride 98 - 110 mmol/L 99  98  100   CO2 20 - 32 mmol/L 27  31  30    Calcium  8.6 - 10.4 mg/dL 9.7  9.7  9.7        Component Value Date/Time   WBC 8.4 12/31/2022 0836   RBC 4.14 12/31/2022 0836   HGB 12.3 12/31/2022 0836   HGB 12.6 04/01/2012 0404  HCT 37.8 12/31/2022 0836   HCT 37.6 04/01/2012 0404   PLT 328.0 12/31/2022 0836   PLT 293 04/01/2012 0404   MCV 91.3 12/31/2022 0836   MCV 93 04/01/2012 0404   MCH 29.8 07/15/2021 0407   MCHC 32.5 12/31/2022 0836   RDW 13.8 12/31/2022 0836   RDW 13.4 04/01/2012 0404   LYMPHSABS 1.6 04/01/2012 0404   MONOABS 0.8 04/01/2012 0404   EOSABS 0.1 04/01/2012 0404   BASOSABS 0.1 04/01/2012 0404     Parts of this note may have been dictated using voice recognition software. There may be variances in spelling and vocabulary which are unintentional. Not all errors are proofread. Please notify the dino if any discrepancies are noted or if the meaning of any statement is not clear.

## 2023-10-22 LAB — LIPID PANEL
Cholesterol: 123 mg/dL (ref <200)
HDL: 48 mg/dL — ABNORMAL LOW (ref >=50)
LDL Cholesterol (Calc): 52 mg/dL
Non-HDL Cholesterol (Calc): 75 mg/dL (ref <130)
Total CHOL/HDL Ratio: 2.6 (calc) (ref <5.0)
Triglycerides: 148 mg/dL (ref <150)

## 2023-10-22 LAB — COMPREHENSIVE METABOLIC PANEL WITH GFR
AG Ratio: 1.4 (calc) (ref 1.0–2.5)
ALT: 33 U/L — ABNORMAL HIGH (ref 6–29)
AST: 24 U/L (ref 10–35)
Albumin: 3.8 g/dL (ref 3.6–5.1)
Alkaline phosphatase (APISO): 67 U/L (ref 37–153)
BUN: 17 mg/dL (ref 7–25)
CO2: 27 mmol/L (ref 20–32)
Calcium: 9.4 mg/dL (ref 8.6–10.4)
Chloride: 102 mmol/L (ref 98–110)
Creat: 0.87 mg/dL (ref 0.50–1.05)
Globulin: 2.8 g/dL (ref 1.9–3.7)
Glucose, Bld: 94 mg/dL (ref 65–99)
Potassium: 4.6 mmol/L (ref 3.5–5.3)
Sodium: 138 mmol/L (ref 135–146)
Total Bilirubin: 0.3 mg/dL (ref 0.2–1.2)
Total Protein: 6.6 g/dL (ref 6.1–8.1)
eGFR: 75 mL/min/1.73m2 (ref >=60)

## 2023-11-05 ENCOUNTER — Other Ambulatory Visit: Payer: Self-pay | Admitting: Internal Medicine

## 2023-11-10 NOTE — Progress Notes (Signed)
 Cardiology Office Note:  .   Date:  11/11/2023  ID:  Karen Miller Miller, DOB September 26, 1960, MRN [id number] PCP: Wendee Lynwood HERO, NP  [facility] Providers Cardiologist:  Soyla DELENA Merck, MD    History of Present Illness: Karen Miller   Karen Miller Miller is a 63 y.o. female.  Discussed the use of AI scribe software for clinical note transcription with the patient, who gave verbal consent to proceed.  History of Present Illness Karen Miller Miller is a 63 year old female with hypertension who presents with dizziness and falls. She was referred by Dr. Anner for further evaluation of her blood pressure management.  She experiences dizziness and falls, particularly when her blood pressure is low. A recent fall occurred on a Saturday before last, attributed to low blood pressure. Home blood pressure readings average 135/70s mmHg, with a heart rate in the 80s to 90s bpm.  Her medication regimen has changed recently. She was previously on HCTZ and amlodipine , which were discontinued. She is now taking Lasix  20 mg daily but notes increased swelling since stopping HCTZ. Her current medications include valsartan  320 mg daily, carvedilol  12.5 mg twice daily, atorvastatin  40 mg daily, and a baby aspirin  daily.  She reports improved mobility since stopping HCTZ, as she was previously unable to walk and required a wheelchair during visits. She is concerned about her ability to walk, mentioning that her husband often has to drop her off at the door due to leg issues. Vascular evaluations have not revealed any significant issues.    ROS: negative except per HPI above.  Studies Reviewed: .        Results LABS Blood Glucose: 159 mg/dL Risk Assessment/Calculations:       Physical Exam:   VS:  BP 138/79 (BP Location: Left Arm, Patient Position: Sitting)   Pulse 88   Ht 5' 5 (1.651 m)   Wt 249 lb 9.6 oz (113.2 kg)   SpO2 99%   BMI 41.54 kg/m    Wt Readings from Last 3 Encounters:  11/11/23 249 lb 9.6 oz  (113.2 kg)  10/21/23 251 lb (113.9 kg)  10/07/23 235 lb (106.6 kg)     Physical Exam GENERAL: Alert, cooperative, well developed, no acute distress. HEENT: Normocephalic, normal oropharynx, moist mucous membranes. CHEST: Clear to auscultation bilaterally, no wheezes, rhonchi, or crackles. CARDIOVASCULAR: Normal heart rate and rhythm, S1 and S2 normal without murmurs. ABDOMEN: Soft, non-tender, non-distended, without organomegaly, normal bowel sounds. EXTREMITIES: Ankle swelling present, no cyanosis. NEUROLOGICAL: Cranial nerves grossly intact, moves all extremities without gross motor or sensory deficit.   ASSESSMENT AND PLAN: .    Assessment and Plan Assessment & Plan Heart failure with preserved ejection fraction with associated edema Heart failure with preserved ejection fraction is well-managed but edema increased after stopping HCTZ and amlodipine . Lasix  20 mg daily is current medication. Jardiance  recommended but not started. - Increase Lasix  to 40 mg as needed for edema management. - Consider re-evaluation of HCTZ in 3-6 months if edema persists.  Hypertension Hypertension managed with valsartan , carvedilol , and Lasix . Blood pressure in 130s/70s is acceptable. Dizziness and falls occurred with low blood pressure. Current management balances blood pressure control and symptoms. - Continue current antihypertensive regimen with valsartan  and carvedilol . - Monitor blood pressure trends over the next 3-6 months. - Reassess need for HCTZ if blood pressure control becomes suboptimal.  History of falls Falls from dizziness due to low blood pressure improved with medication adjustments. Recent fall due to knee instability.  Focus on maintaining blood pressure stability to prevent dizziness-related falls. - Monitor for dizziness and adjust medications as needed to prevent falls.       Soyla Merck, MD, FACC

## 2023-11-11 ENCOUNTER — Telehealth: Payer: Self-pay | Admitting: Internal Medicine

## 2023-11-11 ENCOUNTER — Encounter: Payer: Self-pay | Admitting: Internal Medicine

## 2023-11-11 ENCOUNTER — Ambulatory Visit: Attending: Internal Medicine | Admitting: Internal Medicine

## 2023-11-11 VITALS — BP 138/79 | HR 88 | Ht 65.0 in | Wt 249.6 lb

## 2023-11-11 DIAGNOSIS — Z79899 Other long term (current) drug therapy: Secondary | ICD-10-CM

## 2023-11-11 DIAGNOSIS — R600 Localized edema: Secondary | ICD-10-CM | POA: Diagnosis not present

## 2023-11-11 DIAGNOSIS — I5032 Chronic diastolic (congestive) heart failure: Secondary | ICD-10-CM | POA: Diagnosis not present

## 2023-11-11 DIAGNOSIS — R55 Syncope and collapse: Secondary | ICD-10-CM | POA: Diagnosis not present

## 2023-11-11 DIAGNOSIS — E1159 Type 2 diabetes mellitus with other circulatory complications: Secondary | ICD-10-CM

## 2023-11-11 DIAGNOSIS — I152 Hypertension secondary to endocrine disorders: Secondary | ICD-10-CM

## 2023-11-11 MED ORDER — ATORVASTATIN CALCIUM 40 MG PO TABS: 40.0000 mg | ORAL_TABLET | Freq: Every day | ORAL | 3 refills | Status: AC

## 2023-11-11 NOTE — Patient Instructions (Addendum)
 Medication Instructions:  Please increase your Lasix  to 20 mg two times daily (for a total of 40 mg daily); do this for a few weeks and get in touch with us  regarding the swelling and if you will need a new prescription for 40 mg tablets.  Lab Work: None  Follow-Up: At [facility], you and your health needs are our priority.  As part of our continuing mission to provide you with exceptional heart care, our providers are all part of one team.  This team includes your primary Cardiologist (physician) and Advanced Practice Providers or APPs (Physician Assistants and Nurse Practitioners) who all work together to provide you with the care you need, when you need it.  Your next appointment:   02/15/2024 at 8:00 am  Provider:   Damien Braver, NP   Other Instructions Please call us  or send a MyChart message with any Cardiology related questions/concerns.  [phone].  Thank you!

## 2023-11-11 NOTE — Telephone Encounter (Signed)
*  STAT* If patient is at the pharmacy, call can be transferred to refill team.   1. Which medications need to be refilled? (please list name of each medication and dose if known) atorvastatin  (LIPITOR) 40 MG tablet    2. Would you like to learn more about the convenience, safety, & potential cost savings by using the [facility]? NO   3. Are you open to using the [facility] NO   4. Which pharmacy/location (including street and city if local pharmacy) is medication to be sent to?  [facility] - [place] - [address]     5. Do they need a 30 day or 90 day supply? 90

## 2023-11-11 NOTE — Telephone Encounter (Signed)
 Pt's medication was sent to pt's pharmacy as requested. Confirmation received.

## 2023-11-26 ENCOUNTER — Encounter: Payer: Self-pay | Admitting: Internal Medicine

## 2023-11-26 MED ORDER — FUROSEMIDE 40 MG PO TABS: 40.0000 mg | ORAL_TABLET | Freq: Every day | ORAL | 3 refills | Status: DC | PRN

## 2023-11-26 NOTE — Telephone Encounter (Signed)
 Per last office note--  Increase Lasix  to 40 mg as needed for edema management   Prescription sent to pharmacy.  Will forward to Dr Acharya to see if any changes recommended.

## 2023-12-22 ENCOUNTER — Other Ambulatory Visit: Payer: Self-pay | Admitting: Nurse Practitioner

## 2023-12-22 DIAGNOSIS — Z1231 Encounter for screening mammogram for malignant neoplasm of breast: Secondary | ICD-10-CM

## 2024-01-05 ENCOUNTER — Encounter: Payer: Self-pay | Admitting: Nurse Practitioner

## 2024-01-05 ENCOUNTER — Ambulatory Visit: Payer: 59 | Admitting: Nurse Practitioner

## 2024-01-05 VITALS — BP 134/70 | HR 80 | Temp 98.0°F | Ht 64.0 in | Wt 245.0 lb

## 2024-01-05 DIAGNOSIS — E785 Hyperlipidemia, unspecified: Secondary | ICD-10-CM

## 2024-01-05 DIAGNOSIS — Z23 Encounter for immunization: Secondary | ICD-10-CM | POA: Diagnosis not present

## 2024-01-05 DIAGNOSIS — E1169 Type 2 diabetes mellitus with other specified complication: Secondary | ICD-10-CM | POA: Diagnosis not present

## 2024-01-05 DIAGNOSIS — E559 Vitamin D deficiency, unspecified: Secondary | ICD-10-CM | POA: Diagnosis not present

## 2024-01-05 DIAGNOSIS — Z0001 Encounter for general adult medical examination with abnormal findings: Secondary | ICD-10-CM

## 2024-01-05 DIAGNOSIS — I152 Hypertension secondary to endocrine disorders: Secondary | ICD-10-CM

## 2024-01-05 DIAGNOSIS — M545 Low back pain, unspecified: Secondary | ICD-10-CM | POA: Insufficient documentation

## 2024-01-05 DIAGNOSIS — L659 Nonscarring hair loss, unspecified: Secondary | ICD-10-CM

## 2024-01-05 DIAGNOSIS — Z0000 Encounter for general adult medical examination without abnormal findings: Secondary | ICD-10-CM

## 2024-01-05 DIAGNOSIS — I5032 Chronic diastolic (congestive) heart failure: Secondary | ICD-10-CM

## 2024-01-05 DIAGNOSIS — E1159 Type 2 diabetes mellitus with other circulatory complications: Secondary | ICD-10-CM | POA: Diagnosis not present

## 2024-01-05 DIAGNOSIS — Z7984 Long term (current) use of oral hypoglycemic drugs: Secondary | ICD-10-CM

## 2024-01-05 LAB — CBC WITH DIFFERENTIAL/PLATELET
Basophils Absolute: 0.1 K/uL (ref 0.0–0.1)
Basophils Relative: 0.6 % (ref 0.0–3.0)
Eosinophils Absolute: 0.1 K/uL (ref 0.0–0.7)
Eosinophils Relative: 0.8 % (ref 0.0–5.0)
HCT: 37.7 % (ref 36.0–46.0)
Hemoglobin: 12.2 g/dL (ref 12.0–15.0)
Lymphocytes Relative: 12.4 % (ref 12.0–46.0)
Lymphs Abs: 1.2 K/uL (ref 0.7–4.0)
MCHC: 32.5 g/dL (ref 30.0–36.0)
MCV: 86.2 fl (ref 78.0–100.0)
Monocytes Absolute: 0.7 K/uL (ref 0.1–1.0)
Monocytes Relative: 7 % (ref 3.0–12.0)
Neutro Abs: 7.6 K/uL (ref 1.4–7.7)
Neutrophils Relative %: 79.2 % — ABNORMAL HIGH (ref 43.0–77.0)
Platelets: 359 K/uL (ref 150.0–400.0)
RBC: 4.37 Mil/uL (ref 3.87–5.11)
RDW: 15.7 % — ABNORMAL HIGH (ref 11.5–15.5)
WBC: 9.6 K/uL (ref 4.0–10.5)

## 2024-01-05 LAB — COMPREHENSIVE METABOLIC PANEL WITH GFR
ALT: 14 U/L (ref 0–35)
AST: 16 U/L (ref 0–37)
Albumin: 4.4 g/dL (ref 3.5–5.2)
Alkaline Phosphatase: 73 U/L (ref 39–117)
BUN: 33 mg/dL — ABNORMAL HIGH (ref 6–23)
CO2: 29 meq/L (ref 19–32)
Calcium: 10.2 mg/dL (ref 8.4–10.5)
Chloride: 98 meq/L (ref 96–112)
Creatinine, Ser: 0.85 mg/dL (ref 0.40–1.20)
GFR: 72.98 mL/min (ref >60.00)
Glucose, Bld: 90 mg/dL (ref 70–99)
Potassium: 4.4 meq/L (ref 3.5–5.1)
Sodium: 140 meq/L (ref 135–145)
Total Bilirubin: 0.4 mg/dL (ref 0.2–1.2)
Total Protein: 7.8 g/dL (ref 6.0–8.3)

## 2024-01-05 LAB — LIPID PANEL
Cholesterol: 132 mg/dL (ref 0–200)
HDL: 47.9 mg/dL (ref >39.00)
LDL Cholesterol: 53 mg/dL (ref 0–99)
NonHDL: 83.97
Total CHOL/HDL Ratio: 3
Triglycerides: 153 mg/dL — ABNORMAL HIGH (ref 0.0–149.0)
VLDL: 30.6 mg/dL (ref 0.0–40.0)

## 2024-01-05 LAB — TSH: TSH: 2.12 u[IU]/mL (ref 0.35–5.50)

## 2024-01-05 LAB — VITAMIN D 25 HYDROXY (VIT D DEFICIENCY, FRACTURES): VITD: 48.07 ng/mL (ref 30.00–100.00)

## 2024-01-05 LAB — VITAMIN B12: Vitamin B-12: 1168 pg/mL — ABNORMAL HIGH (ref 211–911)

## 2024-01-05 LAB — MAGNESIUM: Magnesium: 1.4 mg/dL — ABNORMAL LOW (ref 1.5–2.5)

## 2024-01-05 LAB — MICROALBUMIN / CREATININE URINE RATIO
Creatinine,U: 22.2 mg/dL
Microalb Creat Ratio: UNDETERMINED mg/g (ref 0.0–30.0)
Microalb, Ur: <0.7 mg/dL

## 2024-01-05 NOTE — Assessment & Plan Note (Signed)
 Currently maintained on Jardiance  10 mg daily and metformin  1000 mg twice daily.  She is followed by endocrinology did review last A1c.  Continue medication as prescribed follow-up with specialist as recommended

## 2024-01-05 NOTE — Assessment & Plan Note (Signed)
History of the same pending vitamin D level today

## 2024-01-05 NOTE — Assessment & Plan Note (Signed)
 Currently on carvedilol  12.5mg  twice daily, valsartan  320 mg daily, furosemide  40 mg daily.  Patient is followed by cardiology continue medication as prescribed

## 2024-01-05 NOTE — Assessment & Plan Note (Signed)
 Negative exam.  Seems to be improving on its own.  If no improvement over the next week consider following up with sports medicine.  Patient will reach out

## 2024-01-05 NOTE — Assessment & Plan Note (Signed)
 Currently maintained on carvedilol  12.5 mg twice daily, furosemide  40 mg daily.  Followed by cardiology.  Continue

## 2024-01-05 NOTE — Assessment & Plan Note (Signed)
 Discussed age-appropriate immunization and screening exams.  Did review patient's personal, surgical, social, family histories.  Patient is up-to-date with all age-appropriate vaccinations she would like.  Update flu vaccine today.  Did discuss shingles vaccine in office.  Patient is up-to-date on CRC screening.  Has mammogram scheduled for breast cancer screening.  Patient refuses cervical cancer screening today.  Patient was given information at discharge about preventative healthcare maintenance with anticipatory guidance.

## 2024-01-05 NOTE — Assessment & Plan Note (Signed)
 Multifactorial will check TSH, CBC, B12, vitamin D , magnesium.  Can consider referring to dermatology

## 2024-01-05 NOTE — Progress Notes (Signed)
 Established Patient Office Visit  Subjective   Patient ID: Karen Miller, female    DOB: [birth year]  Age: 63 y.o. MRN: [id number]  Chief Complaint  Patient presents with   Annual Exam    Flu vaccine     HPI  DM2: Patient currently maintained on Jardiance  10 mg daily and metformin  1000 mg twice daily.  She is followed by endocrinology  CHF/HTN: Patient currently maintained on carvedilol  12.5 mg twice daily, furosemide  40 mg daily as needed and valsartan  320 mg daily  HLD: Patient currently maintained on atorvastatin  40 mg daily  Leg problem: she saw Dr. Selinda Gu and could not find a cause from the testing. She has had 2 falls. States that her bp was 99/40 and was staying that way.  States the second time she fell her knee gave way and she hit the payment   for complete physical and follow up of chronic conditions.  Immunizations: -Tetanus: Completed in 2024 -Influenza: update today  -Shingles: discussing it with her spouse -Pneumonia: Completed 2024  Diet: Fair diet. She is eating 2 meals a day. She will do breakfast and then will do dinner. She is doing 1 cup of coffee int he am and water with zero sugar propel  Exercise: No regular exercise. She has been walking more. She is doing 0.5-46mile evefy morning   Eye exam: Completes annually. Glasses and UTD  Dental exam: Needs updating    Colonoscopy: Completed in 01/09/2022, repeat 3 years.  Patient will be due 2026 Lung Cancer Screening: N/A  Pap smear: patient declines  Mammogram: Done 11/12/2022.  Patient scheduled for 01/21/2024  DEXA: Too young  Sleep: going to bed around 4-5pm and will get up around 1-2am. Feels rested. Does not snore     Review of Systems  Constitutional:  Negative for chills and fever.  Respiratory:  Negative for shortness of breath.   Cardiovascular:  Negative for chest pain and leg swelling.  Gastrointestinal:  Negative for abdominal pain, blood in stool, constipation, diarrhea, nausea and  vomiting.       BM daily   Genitourinary:  Negative for dysuria and hematuria.  Musculoskeletal:  Positive for back pain and joint pain.  Neurological:  Negative for dizziness, tingling, weakness and headaches.  Psychiatric/Behavioral:  Negative for hallucinations and suicidal ideas.       Objective:     BP 134/70   Pulse 80   Temp 98 F (36.7 C) (Oral)   Ht 5' 4 (1.626 m)   Wt 245 lb (111.1 kg)   SpO2 96%   BMI 42.05 kg/m  BP Readings from Last 3 Encounters:  01/05/24 134/70  11/11/23 138/79  10/21/23 (!) 140/80   Wt Readings from Last 3 Encounters:  01/05/24 245 lb (111.1 kg)  11/11/23 249 lb 9.6 oz (113.2 kg)  10/21/23 251 lb (113.9 kg)   SpO2 Readings from Last 3 Encounters:  01/05/24 96%  11/11/23 99%  10/21/23 96%      Physical Exam Vitals and nursing note reviewed.  Constitutional:      Appearance: Normal appearance.  HENT:     Right Ear: Tympanic membrane, ear canal and external ear normal.     Left Ear: Tympanic membrane, ear canal and external ear normal.     Mouth/Throat:     Mouth: Mucous membranes are moist.     Pharynx: Oropharynx is clear.  Eyes:     Extraocular Movements: Extraocular movements intact.     Pupils: Pupils are  equal, round, and reactive to light.  Cardiovascular:     Rate and Rhythm: Normal rate and regular rhythm.     Pulses: Normal pulses.     Heart sounds: Normal heart sounds.  Pulmonary:     Effort: Pulmonary effort is normal.     Breath sounds: Normal breath sounds.  Abdominal:     General: Bowel sounds are normal. There is no distension.     Palpations: There is no mass.     Tenderness: There is no abdominal tenderness.     Hernia: No hernia is present.  Musculoskeletal:        General: No tenderness.     Lumbar back: No tenderness or bony tenderness. Negative right straight leg raise test and negative left straight leg raise test.       Back:     Right lower leg: No edema.     Left lower leg: No edema.   Lymphadenopathy:     Cervical: No cervical adenopathy.  Skin:    General: Skin is warm.  Neurological:     General: No focal deficit present.     Mental Status: She is alert.     Deep Tendon Reflexes:     Reflex Scores:      Bicep reflexes are 2+ on the right side and 2+ on the left side.      Patellar reflexes are 1+ on the right side and 1+ on the left side.    Comments: Bilateral upper and lower extremity strength 5/5  Psychiatric:        Mood and Affect: Mood normal.        Behavior: Behavior normal.        Thought Content: Thought content normal.        Judgment: Judgment normal.      No results found for any visits on 01/05/24.    The ASCVD Risk score (Arnett DK, et al., 2019) failed to calculate for the following reasons:   The valid total cholesterol range is 130 to 320 mg/dL    Assessment & Plan:   Problem List Items Addressed This Visit       Cardiovascular and Mediastinum   Chronic heart failure with preserved ejection fraction (HFpEF) (HCC) (Chronic)   Currently maintained on carvedilol  12.5 mg twice daily, furosemide  40 mg daily.  Followed by cardiology.  Continue      Relevant Orders   CBC with Differential/Platelet   Comprehensive metabolic panel with GFR   TSH   Hypertension associated with diabetes (HCC) (Chronic)   Currently on carvedilol  12.5mg  twice daily, valsartan  320 mg daily, furosemide  40 mg daily.  Patient is followed by cardiology continue medication as prescribed      Relevant Orders   CBC with Differential/Platelet   Comprehensive metabolic panel with GFR   Lipid panel   TSH     Endocrine   Type 2 diabetes mellitus with hyperlipidemia (HCC)   Currently maintained on Jardiance  10 mg daily and metformin  1000 mg twice daily.  She is followed by endocrinology did review last A1c.  Continue medication as prescribed follow-up with specialist as recommended      Relevant Orders   CBC with Differential/Platelet   Comprehensive  metabolic panel with GFR   Lipid panel   Microalbumin / creatinine urine ratio     Other   Morbid obesity (HCC)   Pending TSH and lipid panel.  Patient has slowly been losing weight continue working on lifestyle modifications  Relevant Orders   Lipid panel   TSH   Hair loss   Multifactorial will check TSH, CBC, B12, vitamin D , magnesium.  Can consider referring to dermatology      Relevant Orders   CBC with Differential/Platelet   TSH   VITAMIN D  25 Hydroxy (Vit-D Deficiency, Fractures)   Vitamin B12   Magnesium   Encounter for general adult medical examination with abnormal findings - Primary   Discussed age-appropriate immunization and screening exams.  Did review patient's personal, surgical, social, family histories.  Patient is up-to-date with all age-appropriate vaccinations she would like.  Update flu vaccine today.  Did discuss shingles vaccine in office.  Patient is up-to-date on CRC screening.  Has mammogram scheduled for breast cancer screening.  Patient refuses cervical cancer screening today.  Patient was given information at discharge about preventative healthcare maintenance with anticipatory guidance.      Vitamin D  deficiency   History of the same pending vitamin D  level today      Relevant Orders   VITAMIN D  25 Hydroxy (Vit-D Deficiency, Fractures)   Acute left-sided low back pain without sciatica   Negative exam.  Seems to be improving on its own.  If no improvement over the next week consider following up with sports medicine.  Patient will reach out      Other Visit Diagnoses       Need for influenza vaccination       Relevant Orders   Flu vaccine trivalent PF, 6mos and older(Flulaval,Afluria,Fluarix,Fluzone) (Completed)       Return in about 1 year (around 01/04/2025) for CPE and Labs.    Adina Crandall, NP

## 2024-01-05 NOTE — Assessment & Plan Note (Signed)
 Pending TSH and lipid panel.  Patient has slowly been losing weight continue working on lifestyle modifications

## 2024-01-05 NOTE — Patient Instructions (Signed)
 Nice to see you today I will be in touch with the labs once I have them  Follow up with me in 1 year, sooner If you need me  We did update your flu vaccine today  Consider getting your shingles vaccine

## 2024-01-06 ENCOUNTER — Ambulatory Visit: Payer: Self-pay | Admitting: Nurse Practitioner

## 2024-01-07 ENCOUNTER — Encounter: Payer: Self-pay | Admitting: Nurse Practitioner

## 2024-01-21 ENCOUNTER — Ambulatory Visit
Admission: RE | Admit: 2024-01-21 | Discharge: 2024-01-21 | Disposition: A | Discharge status: Home / Self Care | Source: Ambulatory Visit | Attending: Nurse Practitioner | Admitting: Nurse Practitioner

## 2024-01-21 DIAGNOSIS — Z1231 Encounter for screening mammogram for malignant neoplasm of breast: Secondary | ICD-10-CM | POA: Insufficient documentation

## 2024-01-25 ENCOUNTER — Ambulatory Visit: Payer: Self-pay | Admitting: Nurse Practitioner

## 2024-01-27 ENCOUNTER — Ambulatory Visit: Admitting: "Endocrinology

## 2024-01-27 ENCOUNTER — Encounter: Payer: Self-pay | Admitting: "Endocrinology

## 2024-01-27 VITALS — BP 130/80 | HR 86 | Ht 64.0 in | Wt 250.0 lb

## 2024-01-27 DIAGNOSIS — Z7984 Long term (current) use of oral hypoglycemic drugs: Secondary | ICD-10-CM

## 2024-01-27 DIAGNOSIS — E11319 Type 2 diabetes mellitus with unspecified diabetic retinopathy without macular edema: Secondary | ICD-10-CM

## 2024-01-27 DIAGNOSIS — R809 Proteinuria, unspecified: Secondary | ICD-10-CM

## 2024-01-27 DIAGNOSIS — E7800 Pure hypercholesterolemia, unspecified: Secondary | ICD-10-CM

## 2024-01-27 LAB — POCT GLYCOSYLATED HEMOGLOBIN (HGB A1C): Hemoglobin A1C: 6 % — AB (ref 4.0–5.6)

## 2024-01-27 NOTE — Progress Notes (Signed)
 Outpatient Endocrinology Note Obadiah Birmingham, MD  01/27/24   ARADHYA SHELLENBARGER 1960-09-02 [id number]  Referring Provider: Wendee Lynwood HERO, NP Primary Care Provider: Wendee Lynwood HERO, NP Reason for consultation: Subjective   Assessment & Plan  Diagnoses and all orders for this visit:  Type 2 diabetes mellitus with retinopathy, without long-term current use of insulin , macular edema presence unspecified, unspecified laterality, unspecified retinopathy severity (HCC) -     POCT glycosylated hemoglobin (Hb A1C)  Long term (current) use of oral hypoglycemic drugs  Pure hypercholesterolemia   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,  Lab Results  Component Value Date   GFR 72.98 01/05/2024   Hba1c goal less than 7, current Hba1c is  Lab Results  Component Value Date   HGBA1C 6.0 (A) 01/27/2024   Will recommend the following: Metformin  500mg  2 pills twice a day Will start Jardiance  10 mg every day if BG>150, has pills at home   Quit Ozempic  1 mg/week due to itching, couldn't eat/drink, dehydration/falls  (couldn't tolerate 2 mg weekly due to not being able to eat/drink and abdominal pain)  Educated on risks and side effects of SGLT-2 inhibitors including but not limited to yeast infections, urinary tract infections, dehydration, hyperkalemia, euglycemic DKA etc. Patient to notify us  in case of any side effects We will start you on a medication called jardiance . This medicine is once daily and can have side effects of increased urination, dizziness on standing and urinary tract infections. Please be very careful with hygiene after you urinate to help prevent this.  If you have any worsening abdominal pain or fatigue or urinary infection while on this medication, stop it and let us  know.  DexCom was not covered  Stopped trulicity  due to body shakes  No known contraindications/side effects to any of above medications No history of MEN syndrome/medullary thyroid   cancer/pancreatitis or pancreatic cancer in self or family  -Last LD and Tg are as follows: Lab Results  Component Value Date   LDLCALC 53 01/05/2024    Lab Results  Component Value Date   TRIG 153.0 (H) 01/05/2024   -On atorvastatin  40 mg QD -Follow low fat diet and exercise   -Blood pressure goal <140/90 - Microalbumin/creatinine goal is < 30 -Last MA/Cr is as follows: Lab Results  Component Value Date   MICROALBUR <0.7 01/05/2024   -on ACE/ARB Valsartan  320 mg/day -diet changes including salt restriction -limit eating outside -counseled BP targets per standards of diabetes care -uncontrolled blood pressure can lead to retinopathy, nephropathy and cardiovascular and atherosclerotic heart disease  Reviewed and counseled on: -A1C target -Blood sugar targets -Complications of uncontrolled diabetes  -Checking blood sugar before meals and bedtime and bring log next visit -All medications with mechanism of action and side effects -Hypoglycemia management: rule of 15's, Glucagon Emergency Kit and medical alert ID -low-carb low-fat plate-method diet -At least 20 minutes of physical activity per day -Annual dilated retinal eye exam and foot exam -compliance and follow up needs -follow up as scheduled or earlier if problem gets worse  Call if blood sugar is less than 70 or consistently above 250    Take a 15 gm snack of carbohydrate at bedtime before you go to sleep if your blood sugar is less than 100.    If you are going to fast after midnight for a test or procedure, ask your physician for instructions on how to reduce/decrease your insulin  dose.    Call if blood sugar is less than 70  or consistently above 250  -Treating a low sugar by rule of 15  (15 gms of sugar every 15 min until sugar is more than 70) If you feel your sugar is low, test your sugar to be sure If your sugar is low (less than 70), then take 15 grams of a fast acting Carbohydrate (3-4 glucose tablets or  glucose gel or 4 ounces of juice or regular soda) Recheck your sugar 15 min after treating low to make sure it is more than 70 If sugar is still less than 70, treat again with 15 grams of carbohydrate          Don't drive the hour of hypoglycemia  If unconscious/unable to eat or drink by mouth, use glucagon injection or nasal spray baqsimi and call 911. Can repeat again in 15 min if still unconscious.  No follow-ups on file.   I have reviewed current medications, nurse's notes, allergies, vital signs, past medical and surgical history, family medical history, and social history for this encounter. Counseled patient on symptoms, examination findings, lab findings, imaging results, treatment decisions and monitoring and prognosis. The patient understood the recommendations and agrees with the treatment plan. All questions regarding treatment plan were fully answered.  Obadiah Birmingham, MD  01/27/24  History of Present Illness MARYIAH OLVEY is a 63 y.o. year old female who presents for follow up of Type II diabetes mellitus.  ROYAL BEIRNE was first diagnosed in 2022.   Diabetes education +  Home diabetes regimen: Metformin  500mg  2 pills twice a day  Stopped Ozempic  1 mg/week  COMPLICATIONS -  MI/Stroke +  retinopathy -  neuropathy - nephropathy/microalbuminuria  BLOOD SUGAR DATA Checks intermittently before meals: 110-135  Physical Exam  BP 130/80   Pulse 86   Ht 5' 4 (1.626 m)   Wt 250 lb (113.4 kg)   SpO2 95%   BMI 42.91 kg/m    Constitutional: well developed, well nourished Head: normocephalic, atraumatic Eyes: sclera anicteric, no redness Neck: supple Lungs: normal respiratory effort Neurology: alert and oriented Skin: dry, no appreciable rashes Musculoskeletal: no appreciable defects Psychiatric: normal mood and affect Diabetic Foot Exam - Simple   Simple Foot Form Diabetic Foot exam was performed with the following findings: Yes 01/27/2024  8:32 AM  Visual  Inspection No deformities, no ulcerations, no other skin breakdown bilaterally: Yes Sensation Testing Intact to touch and monofilament testing bilaterally: Yes Pulse Check Comments + dorsalis pedia B/L      Current Medications Patient's Medications  New Prescriptions   No medications on file  Previous Medications   ASPIRIN  EC (QC LO-DOSE ASPIRIN ) 81 MG TABLET    TAKE 1 TABLET DAILY - SWALLOW WHOLE   ATORVASTATIN  (LIPITOR) 40 MG TABLET    Take 1 tablet (40 mg total) by mouth daily.   CARVEDILOL  (COREG ) 12.5 MG TABLET    Take 1 tablet (12.5 mg total) by mouth 2 (two) times daily.   CHOLECALCIFEROL (VITAMIN D3) 25 MCG (1000 UNIT) TABLET    Take 2,000 Units by mouth daily.   CYANOCOBALAMIN (VITAMIN B12) 1000 MCG TABLET    Take 1,000 mcg by mouth daily.   EMPAGLIFLOZIN  (JARDIANCE ) 10 MG TABS TABLET    Take 1 tablet (10 mg total) by mouth daily before breakfast.   FUROSEMIDE  (LASIX ) 40 MG TABLET    Take 1 tablet (40 mg total) by mouth daily as needed. For swelling   METFORMIN  (GLUCOPHAGE ) 500 MG TABLET    Take 2 tablets (1,000 mg  total) by mouth 2 (two) times daily with a meal.   VALSARTAN  (DIOVAN ) 320 MG TABLET    Take 1 tablet (320 mg total) by mouth daily.  Modified Medications   No medications on file  Discontinued Medications   No medications on file    Allergies Allergies  Allergen Reactions   Trulicity  [Dulaglutide ] Other (See Comments)    tremor    Past Medical History Past Medical History:  Diagnosis Date   Allergy    Diabetes mellitus without complication (HCC)    on meds   Hyperlipidemia    on meds   Hypertension    on meds    Past Surgical History Past Surgical History:  Procedure Laterality Date   FACIAL FRACTURE SURGERY  2008   right side of the face completely plated-crushed her facial bones on that side   HERNIA REPAIR     OTHER SURGICAL HISTORY  2008   right arm fractured in multiple areas-from right elbow down to right wrist plated and screws    UMBILICAL HERNIA REPAIR     has mesh   WISDOM TOOTH EXTRACTION      Family History family history includes Breast cancer (age of onset: 14) in her sister; Diabetes in her brother, mother, and sister; Heart attack in her sister; Hypertension in her sister; Other in her brother; Thyroid  disease in her sister and sister.  Social History Social History   Socioeconomic History   Marital status: Married    Spouse name: Not on file   Number of children: 2   Years of education: Not on file   Highest education level: Not on file  Occupational History   Not on file  Tobacco Use   Smoking status: Never   Smokeless tobacco: Never  Vaping Use   Vaping status: Never Used  Substance and Sexual Activity   Alcohol use: Never   Drug use: Never   Sexual activity: Not on file  Other Topics Concern   Not on file  Social History Narrative   Retired: worked for the state for 39      Consider rotating when you check your blood glucose (when you wake, 2 hours after breakfast or 2 hours after dinner)   Blood glucose goals:        Fasting:  80-130        2 hours after the start of a meal:  Less than 180        If your post meal reading is higher than 180, identify what you ate or other things that could have contributed              Social Drivers of Corporate Investment Banker Strain: Low Risk  (01/04/2024)   Overall Financial Resource Strain (CARDIA)    Difficulty of Paying Living Expenses: Not hard at all  Food Insecurity: No Food Insecurity (01/04/2024)   Hunger Vital Sign    Worried About Running Out of Food in the Last Year: Never true    Ran Out of Food in the Last Year: Never true  Transportation Needs: Patient Declined (01/04/2024)   PRAPARE - Transportation    Lack of Transportation (Medical): Patient declined    Lack of Transportation (Non-Medical): Patient declined  Physical Activity: Insufficiently Active (01/04/2024)   Exercise Vital Sign    Days of Exercise per Week: 3  days    Minutes of Exercise per Session: 30 min  Stress: Patient Declined (01/04/2024)   [employer] of Occupational Health -  Occupational Stress Questionnaire    Feeling of Stress: Patient declined  Social Connections: Moderately Integrated (01/04/2024)   Social Connection and Isolation Panel    Frequency of Communication with Friends and Family: Twice a week    Frequency of Social Gatherings with Friends and Family: More than three times a week    Attends Religious Services: More than 4 times per year    Active Member of Clubs or Organizations: No    Attends Banker Meetings: Not on file    Marital Status: Married  Intimate Partner Violence: Not on file    Lab Results  Component Value Date   HGBA1C 6.0 (A) 01/27/2024   HGBA1C 5.0 10/21/2023   HGBA1C 5.8 (A) 05/07/2023   Lab Results  Component Value Date   CHOL 132 01/05/2024   Lab Results  Component Value Date   HDL 47.90 01/05/2024   Lab Results  Component Value Date   LDLCALC 53 01/05/2024   Lab Results  Component Value Date   TRIG 153.0 (H) 01/05/2024   Lab Results  Component Value Date   CHOLHDL 3 01/05/2024   Lab Results  Component Value Date   CREATININE 0.85 01/05/2024   Lab Results  Component Value Date   GFR 72.98 01/05/2024   Lab Results  Component Value Date   MICROALBUR <0.7 01/05/2024      Component Value Date/Time   NA 140 01/05/2024 0837   NA 139 01/16/2022 0822   NA 139 04/01/2012 0404   K 4.4 01/05/2024 0837   K 3.9 04/01/2012 0404   CL 98 01/05/2024 0837   CL 106 04/01/2012 0404   CO2 29 01/05/2024 0837   CO2 29 04/01/2012 0404   GLUCOSE 90 01/05/2024 0837   GLUCOSE 95 04/01/2012 0404   BUN 33 (H) 01/05/2024 0837   BUN 25 01/16/2022 0822   BUN 7 04/01/2012 0404   CREATININE 0.85 01/05/2024 0837   CREATININE 0.87 10/21/2023 0858   CALCIUM  10.2 01/05/2024 0837   CALCIUM  8.5 04/01/2012 0404   PROT 7.8 01/05/2024 0837   PROT 7.6 09/04/2021 0912   PROT 7.7  03/25/2012 0851   ALBUMIN 4.4 01/05/2024 0837   ALBUMIN 4.3 09/04/2021 0912   ALBUMIN 3.7 03/25/2012 0851   AST 16 01/05/2024 0837   AST 35 03/25/2012 0851   ALT 14 01/05/2024 0837   ALT 50 03/25/2012 0851   ALKPHOS 73 01/05/2024 0837   ALKPHOS 111 03/25/2012 0851   BILITOT 0.4 01/05/2024 0837   BILITOT 0.5 09/04/2021 0912   BILITOT 0.4 03/25/2012 0851   GFRNONAA >60 07/14/2021 0249   GFRNONAA >60 04/01/2012 0404   GFRAA >60 04/01/2012 0404      Latest Ref Rng & Units 01/05/2024    8:37 AM 10/21/2023    8:58 AM 02/27/2023    8:25 AM  BMP  Glucose 70 - 99 mg/dL 90  94  93   BUN 6 - 23 mg/dL 33  17  24   Creatinine 0.40 - 1.20 mg/dL 9.14  9.12  9.10   BUN/Creat Ratio 6 - 22 (calc)  SEE NOTE:  SEE NOTE:   Sodium 135 - 145 mEq/L 140  138  137   Potassium 3.5 - 5.1 mEq/L 4.4  4.6  4.8   Chloride 96 - 112 mEq/L 98  102  99   CO2 19 - 32 mEq/L 29  27  27    Calcium  8.4 - 10.5 mg/dL 89.7  9.4  9.7  Component Value Date/Time   WBC 9.6 01/05/2024 0837   RBC 4.37 01/05/2024 0837   HGB 12.2 01/05/2024 0837   HGB 12.6 04/01/2012 0404   HCT 37.7 01/05/2024 0837   HCT 37.6 04/01/2012 0404   PLT 359.0 01/05/2024 0837   PLT 293 04/01/2012 0404   MCV 86.2 01/05/2024 0837   MCV 93 04/01/2012 0404   MCH 29.8 07/15/2021 0407   MCHC 32.5 01/05/2024 0837   RDW 15.7 (H) 01/05/2024 0837   RDW 13.4 04/01/2012 0404   LYMPHSABS 1.2 01/05/2024 0837   LYMPHSABS 1.6 04/01/2012 0404   MONOABS 0.7 01/05/2024 0837   MONOABS 0.8 04/01/2012 0404   EOSABS 0.1 01/05/2024 0837   EOSABS 0.1 04/01/2012 0404   BASOSABS 0.1 01/05/2024 0837   BASOSABS 0.1 04/01/2012 0404     Parts of this note may have been dictated using voice recognition software. There may be variances in spelling and vocabulary which are unintentional. Not all errors are proofread. Please notify the dino if any discrepancies are noted or if the meaning of any statement is not clear.

## 2024-01-27 NOTE — Patient Instructions (Signed)

## 2024-02-15 ENCOUNTER — Ambulatory Visit: Attending: Nurse Practitioner | Admitting: Nurse Practitioner

## 2024-02-15 ENCOUNTER — Encounter: Payer: Self-pay | Admitting: Nurse Practitioner

## 2024-02-15 VITALS — BP 142/80 | HR 84 | Ht 63.0 in | Wt 254.0 lb

## 2024-02-15 DIAGNOSIS — E1169 Type 2 diabetes mellitus with other specified complication: Secondary | ICD-10-CM

## 2024-02-15 DIAGNOSIS — R55 Syncope and collapse: Secondary | ICD-10-CM

## 2024-02-15 DIAGNOSIS — I4729 Other ventricular tachycardia: Secondary | ICD-10-CM

## 2024-02-15 DIAGNOSIS — I340 Nonrheumatic mitral (valve) insufficiency: Secondary | ICD-10-CM

## 2024-02-15 DIAGNOSIS — I471 Supraventricular tachycardia, unspecified: Secondary | ICD-10-CM

## 2024-02-15 DIAGNOSIS — I5032 Chronic diastolic (congestive) heart failure: Secondary | ICD-10-CM

## 2024-02-15 DIAGNOSIS — R002 Palpitations: Secondary | ICD-10-CM

## 2024-02-15 DIAGNOSIS — I10 Essential (primary) hypertension: Secondary | ICD-10-CM | POA: Diagnosis not present

## 2024-02-15 DIAGNOSIS — E785 Hyperlipidemia, unspecified: Secondary | ICD-10-CM

## 2024-02-15 DIAGNOSIS — I739 Peripheral vascular disease, unspecified: Secondary | ICD-10-CM

## 2024-02-15 DIAGNOSIS — I3481 Nonrheumatic mitral (valve) annulus calcification: Secondary | ICD-10-CM

## 2024-02-15 DIAGNOSIS — I2510 Atherosclerotic heart disease of native coronary artery without angina pectoris: Secondary | ICD-10-CM

## 2024-02-15 DIAGNOSIS — I342 Nonrheumatic mitral (valve) stenosis: Secondary | ICD-10-CM

## 2024-02-15 NOTE — Progress Notes (Signed)
 Office Visit    Patient Name: Karen Miller Date of Encounter: 02/15/2024  Primary Care Provider:  Wendee Lynwood HERO, NP Primary Cardiologist:  Soyla DELENA Merck, MD  Chief Complaint    63 year old female with a history of CAD, chronic diastolic heart failure, hypertension, hyperlipidemia, PVCs, abdominal aorta dilation, PAD, type 2 diabetes, and obesity who presents for follow-up related to hypertension.   Past Medical History    Past Medical History:  Diagnosis Date   Allergy    Diabetes mellitus without complication (HCC)    on meds   Hyperlipidemia    on meds   Hypertension    on meds   Past Surgical History:  Procedure Laterality Date   FACIAL FRACTURE SURGERY  2008   right side of the face completely plated-crushed her facial bones on that side   HERNIA REPAIR     OTHER SURGICAL HISTORY  2008   right arm fractured in multiple areas-from right elbow down to right wrist plated and screws   UMBILICAL HERNIA REPAIR     has mesh   WISDOM TOOTH EXTRACTION      Allergies  Allergies  Allergen Reactions   Trulicity  [Dulaglutide ] Other (See Comments)    tremor     Labs/Other Studies Reviewed    The following studies were reviewed today:  Cardiac Studies & Procedures   ______________________________________________________________________________________________     ECHOCARDIOGRAM  ECHOCARDIOGRAM COMPLETE 07/07/2022  Narrative ECHOCARDIOGRAM REPORT    Patient Name:   Karen Miller Date of Exam: 07/07/2022 Medical Rec #:  [id number]       Height:       64.0 in Accession #:    [id number]      Weight:       261.1 lb Date of Birth:  12-05-1960       BSA:          2.191 m Patient Age:    61 years        BP:           140/83 mmHg Patient Gender: F               HR:           83 bpm. Exam Location:  [address]  Procedure: 2D Echo, Color Doppler, Cardiac Doppler and Intracardiac Opacification Agent  Indications:    Chronic diastolic heart failure (HCC)  [P49.67]  History:        Patient has prior history of Echocardiogram examinations, most recent 07/13/2021. Risk Factors:Hypertension, Dyslipidemia and Diabetes.  Sonographer:    Augustin Seals RDCS Referring Phys: [id number] SOYLA DELENA MERCK   Sonographer Comments: Technically difficult study due to poor echo windows. IMPRESSIONS   1. Left ventricular ejection fraction, by estimation, is 60 to 65%. The left ventricle has normal function. The left ventricle has no regional wall motion abnormalities. Left ventricular diastolic parameters are indeterminate. 2. Right ventricular systolic function is normal. The right ventricular size is normal. 3. Left atrial size was moderately dilated. 4. Gradients similar to TTE done 07/13/21 . The mitral valve is degenerative. Mild mitral valve regurgitation. Mild to moderate mitral stenosis. Severe mitral annular calcification. 5. The aortic valve is tricuspid. There is mild calcification of the aortic valve. Aortic valve regurgitation is not visualized. Aortic valve sclerosis is present, with no evidence of aortic valve stenosis. 6. The inferior vena cava is normal in size with greater than 50% respiratory variability, suggesting right atrial pressure of 3 mmHg.  FINDINGS Left  Ventricle: Left ventricular ejection fraction, by estimation, is 60 to 65%. The left ventricle has normal function. The left ventricle has no regional wall motion abnormalities. Definity  contrast agent was given IV to delineate the left ventricular endocardial borders. The left ventricular internal cavity size was normal in size. There is no left ventricular hypertrophy. Left ventricular diastolic parameters are indeterminate.  Right Ventricle: The right ventricular size is normal. No increase in right ventricular wall thickness. Right ventricular systolic function is normal.  Left Atrium: Left atrial size was moderately dilated.  Right Atrium: Right atrial size was normal in  size.  Pericardium: There is no evidence of pericardial effusion.  Mitral Valve: Gradients similar to TTE done 07/13/21. The mitral valve is degenerative in appearance. There is severe thickening of the mitral valve leaflet(s). There is severe calcification of the mitral valve leaflet(s). Severe mitral annular calcification. Mild mitral valve regurgitation. Mild to moderate mitral valve stenosis. MV peak gradient, 20.6 mmHg. The mean mitral valve gradient is 10.0 mmHg.  Tricuspid Valve: The tricuspid valve is normal in structure. Tricuspid valve regurgitation is mild . No evidence of tricuspid stenosis.  Aortic Valve: The aortic valve is tricuspid. There is mild calcification of the aortic valve. Aortic valve regurgitation is not visualized. Aortic valve sclerosis is present, with no evidence of aortic valve stenosis.  Pulmonic Valve: The pulmonic valve was normal in structure. Pulmonic valve regurgitation is not visualized. No evidence of pulmonic stenosis.  Aorta: The aortic root is normal in size and structure.  Venous: The inferior vena cava is normal in size with greater than 50% respiratory variability, suggesting right atrial pressure of 3 mmHg.  IAS/Shunts: No atrial level shunt detected by color flow Doppler.   LEFT VENTRICLE PLAX 2D LVIDd:         5.40 cm   Diastology LVIDs:         2.90 cm   LV e' medial:    4.91 cm/s LV PW:         1.20 cm   LV E/e' medial:  39.1 LV IVS:        1.30 cm   LV e' lateral:   7.27 cm/s LVOT diam:     2.20 cm   LV E/e' lateral: 26.4 LV SV:         92 LV SV Index:   42 LVOT Area:     3.80 cm   RIGHT VENTRICLE RV Basal diam:  2.80 cm RV Mid diam:    2.90 cm RV S prime:     11.60 cm/s  LEFT ATRIUM             Index        RIGHT ATRIUM           Index LA diam:        4.90 cm 2.24 cm/m   RA Area:     11.10 cm LA Vol (A2C):   59.3 ml 27.06 ml/m  RA Volume:   19.90 ml  9.08 ml/m LA Vol (A4C):   82.6 ml 37.70 ml/m LA Biplane Vol: 71.0 ml  32.40 ml/m AORTIC VALVE LVOT Vmax:   133.00 cm/s LVOT Vmean:  85.700 cm/s LVOT VTI:    0.243 m  AORTA Ao Root diam: 2.70 cm  MITRAL VALVE MV Area (PHT): 4.06 cm     SHUNTS MV Area VTI:   1.74 cm     Systemic VTI:  0.24 m MV Peak grad:  20.6 mmHg  Systemic Diam: 2.20 cm MV Mean grad:  10.0 mmHg MV Vmax:       2.27 m/s MV Vmean:      156.0 cm/s MV Decel Time: 187 msec MV E velocity: 192.00 cm/s MV A velocity: 215.00 cm/s MV E/A ratio:  0.89  Maude Emmer MD Electronically signed by Maude Emmer MD Signature Date/Time: 07/07/2022/9:18:20 AM    Final    MONITORS  LONG TERM MONITOR (3-14 DAYS) 07/01/2023  Narrative   No atrial fibrillation   Short episodes of NSVT as described below, no patient diary triggers   Short episodes of SVT, as described below, no patient diary triggers.   Patch Wear Time:  13 days and 10 hours (2025-04-01T14:46:09-398 to 2025-04-15T01:40:57-0400)  Patient had a min HR of 68 bpm, max HR of 203 bpm, and avg HR of 89 bpm. Predominant underlying rhythm was Sinus Rhythm. First Degree AV Block was present. 3 Ventricular Tachycardia runs occurred, the run with the fastest interval lasting 9 beats with a max rate of 200 bpm, the longest lasting 9 beats with an avg rate of 115 bpm. 13 Supraventricular Tachycardia runs occurred, the run with the fastest interval lasting 8 beats with a max rate of 203 bpm, the longest lasting 11 beats with an avg rate of 143 bpm. Isolated SVEs were rare (<1.0%), SVE Couplets were rare (<1.0%), and SVE Triplets were rare (<1.0%). Isolated VEs were rare (<1.0%, 1000), VE Couplets were rare (<1.0%, 13), and VE Triplets were rare (<1.0%, 1). Ventricular Bigeminy and Trigeminy were present.   CT SCANS  CT CORONARY MORPH W/CTA COR W/SCORE 07/15/2021  Addendum 07/15/2021  1:53 PM ADDENDUM REPORT: 07/15/2021 13:51  CLINICAL DATA:  48F with hypertension and elevated cardiac enzymes.  EXAM: Cardiac/Coronary   CT  TECHNIQUE: The patient was scanned on a [employer].  FINDINGS: A 120 kV prospective scan was triggered in the descending thoracic aorta at 111 HU's. Axial non-contrast 3 mm slices were carried out through the heart. The data set was analyzed on a dedicated work station and scored using the Agatson method. Gantry rotation speed was 250 msecs and collimation was .6 mm. No beta blockade and 0.8 mg of sl NTG was given. The 3D data set was reconstructed in 5% intervals of the 67-82 % of the R-R cycle. Diastolic phases were analyzed on a dedicated work station using MPR, MIP and VRT modes. The patient received 80 cc of contrast.  Aorta: Normal size. Ascending aorta 3.2 cm. Aortic atherosclerosis. No dissection.  Aortic Valve:  Trileaflet.  No calcifications.  Coronary Arteries:  Normal coronary origin.  Right dominance.  RCA is a large dominant artery that gives rise to PDA and PLVB. There is mild (25-49%) calcified plaque proximally.  Left main is a large artery that gives rise to LAD and LCX arteries. There is minimal (<25%) calcified plaque at the ostium  LAD is a large vessel that has diffuse minimal (<25%) calcified plaque. There is a large D1 and small D2 with minimal (<25%) calcified plaque.  LCX is a non-dominant artery that gives rise to one large OM1 branch. There is minimal (<25%) calcified plaque proximally.  Coronary Calcium  Score:  Left main: 2.52  Left anterior descending artery: 125  Left circumflex artery: 46.7  Right coronary artery: 29.3  Total: 204  Percentile: 94th  Other findings:  Normal pulmonary vein drainage into the left atrium.  Normal let atrial appendage without a thrombus.  Normal size of the pulmonary artery.  Mitral  annular calcification.  IMPRESSION: 1. Coronary calcium  score of 204. This was 94th percentile for age-, race-, and sex-matched controls.  2. Normal coronary origin with right dominance.  3.  Minimal plaques in the left main, LAD, and left circumflex arteries. Mild plaque in the RCA. CAD-RADS 2.  4.  Mitral annular calcification.  5.  Aortic atherosclerosis  6. Consider noncardiac causes of chest pain. Recommend aggressive risk factor modification including LDL goal <70.  Annabella Scarce, MD   Electronically Signed By: Annabella Scarce M.D. On: 07/15/2021 13:51  Narrative EXAM: OVER-READ INTERPRETATION  CT CHEST  The following report is an over-read performed by radiologist Dr. Marcey Moan of [facility] on 07/15/2021. This over-read does not include interpretation of cardiac or coronary anatomy or pathology. The coronary CTA interpretation by the cardiologist is attached.  COMPARISON:  CT a of the chest on 07/12/2021  FINDINGS: Vascular: No significant noncardiac vascular findings.  Mediastinum/Nodes: Visualized mediastinum and hilar regions demonstrate no lymphadenopathy or masses.  Lungs/Pleura: Visualized lungs show no evidence of pulmonary edema, consolidation, pneumothorax, nodule or pleural fluid.  Upper Abdomen: No acute abnormality.  Musculoskeletal: No chest wall mass or suspicious bone lesions identified.  IMPRESSION: No significant incidental findings.  Electronically Signed: By: Marcey Moan M.D. On: 07/15/2021 13:08     ______________________________________________________________________________________________     Recent Labs: 01/05/2024: ALT 14; BUN 33; Creatinine, Ser 0.85; Hemoglobin 12.2; Magnesium 1.4; Platelets 359.0; Potassium 4.4; Sodium 140; TSH 2.12  Recent Lipid Panel    Component Value Date/Time   CHOL 132 01/05/2024 0837   CHOL 125 06/09/2022 0846   TRIG 153.0 (H) 01/05/2024 0837   HDL 47.90 01/05/2024 0837   HDL 42 06/09/2022 0846   CHOLHDL 3 01/05/2024 0837   VLDL 30.6 01/05/2024 0837   LDLCALC 53 01/05/2024 0837   LDLCALC 52 10/21/2023 0858    History of Present Illness     63 year old female with the above past medical history including nonobstructive CAD, HFpEF, hypertension, hyperlipidemia, PVCs, abdominal aorta dilation, PAD, type 2 diabetes, and obesity.   She was hospitalized in May 2023 in the setting of hypertensive urgency, acute diastolic heart failure. Echocardiogram showed EF 65 to 70%, hyperdynamic LV function, severe LVH, indeterminate diastolic parameters, degenerative mitral valve, mild mitral valve regurgitation, mild to moderate mitral stenosis, severe mitral annular calcification. Coronary CT angiogram revealed coronary calcium  score 204, 94th percentile for age, race, and sex-matched controls, minimal plaques in the left main, LAD, and left circumflex arteries, mild plaque in the RCA, mitral annular calcification, and aortic atherosclerosis. Given presence of MAC, blood cultures were obtained however, these were negative. Therefore, TEE was not pursued given low suspicion for endocarditis with negative blood cultures.  Repeat echocardiogram in 06/2022 showed EF 60 to 65%, normal LV function, no RWMA, normal RV systolic function, mild mitral valve regurgitation, mild to moderate mitral valve stenosis, severe MAC. She has since had difficult to control hypertension. 14-day Zio patch (preliminary results) in the setting of pre-syncope, rapid palpitations, revealed predominantly sinus rhythm, first-degree AV block, 3 runs of VT, longest interval lasting 9 beats, 13 runs of SVT, longest episode lasting 11 beats, isolated PACs and PVCs. ABIs in 06/2023 in the setting of bilateral leg pain were normal but with biphasic waveforms. She was referred to vascular surgery. aortic iliac ultrasound in 08/2023 revealed dilation of the distal abdominal aorta measuring 2.4 cm, calcified plaque in the EIA and CIA.  No evidence of venous reflux on bilateral venous reflux study.  Repeat study was recommended in 1 year.  She was last seen in the office on 11/11/2023 and was stable from a  cardiac standpoint.  Lasix  was increased to 40 mg daily as needed for management of bilateral lower extremity edema.  BP was stable.   She presents today for follow-up. Since her last visit she has been stable from a cardiac standpoint. Over the past 2 days she has noted increasingly elevated BP, with SBP ranging from the 120s-140s mmHg. she has stable nonpitting bilateral lower extremity edema. She denies chest pain, palpitations, dizziness, dyspnea, PND, orthopnea, weight gain.  She has had significant hair loss of unclear etiology.  Overall, she reports feeling well.  Home Medications    Current Outpatient Medications  Medication Sig Dispense Refill   aspirin  EC (QC LO-DOSE ASPIRIN ) 81 MG tablet TAKE 1 TABLET DAILY - SWALLOW WHOLE 90 tablet 1   atorvastatin  (LIPITOR) 40 MG tablet Take 1 tablet (40 mg total) by mouth daily. 90 tablet 3   carvedilol  (COREG ) 12.5 MG tablet Take 1 tablet (12.5 mg total) by mouth 2 (two) times daily. 180 tablet 3   cholecalciferol (VITAMIN D3) 25 MCG (1000 UNIT) tablet Take 2,000 Units by mouth daily.     cyanocobalamin  (VITAMIN B12) 1000 MCG tablet Take 1,000 mcg by mouth daily.     furosemide  (LASIX ) 40 MG tablet Take 1 tablet (40 mg total) by mouth daily as needed. For swelling (Patient taking differently: Take 40 mg by mouth daily. For swelling) 30 tablet 3   metFORMIN  (GLUCOPHAGE ) 500 MG tablet Take 2 tablets (1,000 mg total) by mouth 2 (two) times daily with a meal. 360 tablet 1   valsartan  (DIOVAN ) 320 MG tablet Take 1 tablet (320 mg total) by mouth daily. 90 tablet 3   empagliflozin  (JARDIANCE ) 10 MG TABS tablet Take 1 tablet (10 mg total) by mouth daily before breakfast. (Patient not taking: Reported on 02/15/2024) 90 tablet 1   No current facility-administered medications for this visit.     Review of Systems   She denies chest pain, palpitations, dyspnea, pnd, orthopnea, n, v, dizziness, syncope, weight gain, or early satiety. All other systems  reviewed and are otherwise negative except as noted above.   Physical Exam    VS:  BP (!) 142/80 (BP Location: Left Arm, Cuff Size: Large)   Pulse 84   Ht 5' 3 (1.6 m)   Wt 254 lb (115.2 kg)   SpO2 97%   BMI 44.99 kg/m   GEN: Well nourished, well developed, in no acute distress. HEENT: normal. Neck: Supple, no JVD, carotid bruits, or masses. Cardiac: RRR, no murmurs, rubs, or gallops. No clubbing, cyanosis, edema.  Radials/DP/PT 2+ and equal bilaterally.  Respiratory:  Respirations regular and unlabored, clear to auscultation bilaterally. GI: Soft, nontender, nondistended, BS + x 4. MS: no deformity or atrophy. Skin: warm and dry, no rash. Neuro:  Strength and sensation are intact. Psych: Normal affect.  Accessory Clinical Findings    ECG personally reviewed by me today -    - no EKG in office today.    Lab Results  Component Value Date   WBC 9.6 01/05/2024   HGB 12.2 01/05/2024   HCT 37.7 01/05/2024   MCV 86.2 01/05/2024   PLT 359.0 01/05/2024   Lab Results  Component Value Date   CREATININE 0.85 01/05/2024   BUN 33 (H) 01/05/2024   NA 140 01/05/2024   K 4.4 01/05/2024   CL 98 01/05/2024   CO2 29  01/05/2024   Lab Results  Component Value Date   ALT 14 01/05/2024   AST 16 01/05/2024   ALKPHOS 73 01/05/2024   BILITOT 0.4 01/05/2024   Lab Results  Component Value Date   CHOL 132 01/05/2024   HDL 47.90 01/05/2024   LDLCALC 53 01/05/2024   TRIG 153.0 (H) 01/05/2024   CHOLHDL 3 01/05/2024    Lab Results  Component Value Date   HGBA1C 6.0 (A) 01/27/2024    Assessment & Plan    1. Hypertension: BP mildly elevated in office today, has been generally well-controlled.  Over the past 2 days she has noted increasingly elevated BP, with SBP ranging from the 120s to 140s mmHg.  Given history of labile BP with presyncope, will continue to monitor.  Report BP consistent greater than 140/80 mmHg.  Should BP remain elevated above goal, consider addition of low-dose  hydrochlorothiazide .  For now, continue current antihypertensive regimen.   2. Palpitations/NSVT/PSVT/presyncope: No documented evidence of atrial fibrillation.  14-day Zio patch in 06/2023 in the setting of palpitations, presyncope, revealed predominantly sinus rhythm, first-degree AV block, 3 runs of VT, longest interval lasting 9 beats, 13 runs of SVT, longest episode lasting 11 beats, isolated PACs and PVCs. Denies any recent palpitations. Denies any recurrent presyncope or syncope. Continue carvedilol .   3. Nonobstructive CAD: Coronary CT angiogram revealed coronary calcium  score 204, 94th percentile for age, race, and sex-matched controls, minimal plaques in the left main, LAD, and left circumflex arteries, mild plaque in the RCA, mitral annular calcification, and aortic atherosclerosis. Stable with no anginal symptoms. No indication for ischemic evaluation. Continue aspirin , valsartan , carvedilol , and Lipitor.   4. Chronic diastolic heart failure: Most recent echo in 06/2022 showed EF 60 to 65%, normal LV function, no RWMA, normal RV systolic function, mild mitral valve regurgitation, mild to moderate mitral valve stenosis, severe MAC. She notes ongoing nonpitting bilateral lower extremity edema, well compensated on exam. Continue compression, elevation.  For now, continue Lasix  at current dose, continue carvedilol , valsartan , Jardiance .   5. Valvular heart disease: Most recent echo as above.  History of mild mitral valve regurgitation, mild to moderate mitral valve stenosis, severe MAC.  Prior blood cultures were negative.  TEE was not pursued.  She is asymptomatic.  Consider repeat echocardiogram as clinically indicated.     6. Hyperlipidemia: LDL was 53 in 12/2023.  Continue Lipitor.   7. PAD/abdominal aorta dilation: ABIs in 06/2023 in the setting of bilateral leg pain were normal but with biphasic waveforms. She was referred to vascular surgery. Aortic iliac ultrasound in 08/2023 revealed  dilation of the distal abdominal aorta measuring 2.4 cm, calcified plaque in the EIA and CIA. No evidence of venous reflux on bilateral venous reflux study. Repeat study was recommended in 1 year.  Continue ASA, Lipitor.   8. Type 2 diabetes/obesity:  A1c was 6.0 in 01/2024.  She is following with endocrinology.   9. Disposition: Follow-up in 3 months.   HYPERTENSION CONTROL Vitals:   02/15/24 0754 02/15/24 0810  BP: (!) 146/78 (!) 142/80    The patient's blood pressure is elevated above target today.  In order to address the patient's elevated BP: Blood pressure will be monitored at home to determine if medication changes need to be made.; Follow up with general cardiology has been recommended.      Damien JAYSON Braver, NP 02/15/2024, 9:17 AM

## 2024-02-15 NOTE — Patient Instructions (Signed)
 Medication Instructions:  Your physician recommends that you continue on your current medications as directed. Please refer to the Current Medication list given to you today.  *If you need a refill on your cardiac medications before your next appointment, please call your pharmacy*  Lab Work: NONE ordered at this time of appointment   Testing/Procedures: NONE ordered at this time of appointment   Follow-Up: At [facility], you and your health needs are our priority.  As part of our continuing mission to provide you with exceptional heart care, our providers are all part of one team.  This team includes your primary Cardiologist (physician) and Advanced Practice Providers or APPs (Physician Assistants and Nurse Practitioners) who all work together to provide you with the care you need, when you need it.  Your next appointment:   3 month(s)  Provider:   Soyla DELENA Merck, MD or Damien Braver, NP          We recommend signing up for the patient portal called MyChart.  Sign up information is provided on this After Visit Summary.  MyChart is used to connect with patients for Virtual Visits (Telemedicine).  Patients are able to view lab/test results, encounter notes, upcoming appointments, etc.  Non-urgent messages can be sent to your provider as well.   To learn more about what you can do with MyChart, go to [email or web address].   Other Instructions Monitor Blood pressure. Report Blood pressure consistently greater than 140/80.

## 2024-02-19 ENCOUNTER — Encounter: Payer: Self-pay | Admitting: Nurse Practitioner

## 2024-02-19 ENCOUNTER — Ambulatory Visit: Admitting: Nurse Practitioner

## 2024-02-19 VITALS — BP 116/60 | HR 103 | Temp 99.8°F | Ht 63.0 in | Wt 253.2 lb

## 2024-02-19 DIAGNOSIS — J069 Acute upper respiratory infection, unspecified: Secondary | ICD-10-CM | POA: Insufficient documentation

## 2024-02-19 DIAGNOSIS — R051 Acute cough: Secondary | ICD-10-CM | POA: Insufficient documentation

## 2024-02-19 DIAGNOSIS — R0981 Nasal congestion: Secondary | ICD-10-CM | POA: Insufficient documentation

## 2024-02-19 MED ORDER — BENZONATATE 100 MG PO CAPS: 100.0000 mg | ORAL_CAPSULE | Freq: Three times a day (TID) | ORAL | 0 refills | Status: AC | PRN

## 2024-02-19 MED ORDER — FLUTICASONE PROPIONATE 50 MCG/ACT NA SUSP: 2.0000 | Freq: Every day | NASAL | 0 refills | Status: AC

## 2024-02-19 NOTE — Patient Instructions (Signed)
 Nice to see you today I think you have an upper respiratory infection  Rest drink plenty of fluid. Use tylenol as needed for aches and If you get a fever I have sent in nasal spray and cough pills Follow up if you do not start improving early next week

## 2024-02-19 NOTE — Progress Notes (Signed)
 Established Patient Office Visit  Subjective   Patient ID: Karen Miller, female    DOB: September 05, 1960  Age: 63 y.o. MRN: [id number]  Chief Complaint  Patient presents with   cough annd congestion    Pt complains of cough that started 2 days ago. States she tested for covid twice and both tests are negative. No body aches, sore throat, fever or chills.     HPI  Discussed the use of AI scribe software for clinical note transcription with the patient, who gave verbal consent to proceed.  History of Present Illness Karen Miller is a 63 year old female who presents with a dry cough and nasal congestion.  She began experiencing symptoms two days ago, including a dry, non-productive cough and nasal congestion. She feels congested but is unable to expel any mucus.  No fever, chills, sore throat, ear pain, sinus pressure, shortness of breath, wheezing, chest pain, headache, nausea, vomiting, diarrhea, abdominal pain, or body aches. No increased fatigue.  She has tested negative for COVID-19 twice at home today and has received her flu shot this season, as well as the original COVID vaccine series.  She has not taken any over-the-counter medications for her symptoms due to uncertainty about interactions with her current medications, which include atorvastatin , carvedilol , furosemide , and valsartan . Jardiance  has been put on hold, and she has an allergy or intolerance to Trulicity .  Socially, she regularly walks with her husband, even in cold weather, and has not been around anyone sick recently. She did not gather with family for the holidays.    Review of Systems  Constitutional:  Negative for chills, fever and malaise/fatigue.  HENT:  Positive for congestion. Negative for ear discharge, ear pain, sinus pain and sore throat.   Respiratory:  Negative for cough, sputum production and wheezing.   Cardiovascular:  Negative for chest pain.  Gastrointestinal:  Negative for abdominal pain,  diarrhea, nausea and vomiting.  Musculoskeletal:  Negative for joint pain and myalgias.  Neurological:  Negative for headaches.      Objective:     BP 116/60   Pulse (!) 103   Temp 99.8 F (37.7 C) (Oral)   Ht 5' 3 (1.6 m)   Wt 253 lb 3.2 oz (114.9 kg)   SpO2 96%   BMI 44.85 kg/m  BP Readings from Last 3 Encounters:  02/19/24 116/60  02/15/24 (!) 142/80  01/27/24 130/80   Wt Readings from Last 3 Encounters:  02/19/24 253 lb 3.2 oz (114.9 kg)  02/15/24 254 lb (115.2 kg)  01/27/24 250 lb (113.4 kg)   SpO2 Readings from Last 3 Encounters:  02/19/24 96%  02/15/24 97%  01/27/24 95%      Physical Exam Vitals and nursing note reviewed.  Constitutional:      Appearance: Normal appearance.  HENT:     Right Ear: Tympanic membrane, ear canal and external ear normal.     Left Ear: Tympanic membrane, ear canal and external ear normal.     Nose:     Right Sinus: No maxillary sinus tenderness or frontal sinus tenderness.     Left Sinus: No maxillary sinus tenderness or frontal sinus tenderness.     Mouth/Throat:     Mouth: Mucous membranes are moist.     Pharynx: Oropharynx is clear.  Cardiovascular:     Rate and Rhythm: Normal rate and regular rhythm.     Heart sounds: Normal heart sounds.  Pulmonary:     Effort: Pulmonary effort is  normal.     Breath sounds: Normal breath sounds.  Lymphadenopathy:     Cervical: No cervical adenopathy.  Neurological:     Mental Status: She is alert.      No results found for any visits on 02/19/24.    The 10-year ASCVD risk score (Arnett DK, et al., 2019) is: 7.8%    Assessment & Plan:   Problem List Items Addressed This Visit       Respiratory   Viral upper respiratory tract infection     Other   Nasal congestion   Relevant Medications   fluticasone (FLONASE) 50 MCG/ACT nasal spray   Acute cough - Primary   Relevant Medications   benzonatate (TESSALON) 100 MG capsule   Assessment and Plan Assessment &  Plan Acute upper respiratory infection Likely viral etiology. COVID-19 and influenza unlikely due to negative tests and symptom profile. No antibiotics indicated. - Prescribed nasal spray for daily use. - Prescribed non-sedating cough pills up to three times daily. - Advised Vicks for nighttime relief. - Encouraged fluid intake and rest. - Instructed to monitor symptoms and report fever or worsening condition.   Return if symptoms worsen or fail to improve.    Karen Crandall, NP

## 2024-02-19 NOTE — Telephone Encounter (Signed)
Will evaluate at office visit

## 2024-02-22 ENCOUNTER — Encounter: Payer: Self-pay | Admitting: Nurse Practitioner

## 2024-02-22 MED ORDER — DOXYCYCLINE HYCLATE 100 MG PO TABS: 100.0000 mg | ORAL_TABLET | Freq: Two times a day (BID) | ORAL | 0 refills | Status: AC

## 2024-02-22 MED ORDER — HYDROCODONE BIT-HOMATROP MBR 5-1.5 MG/5ML PO SOLN: 5.0000 mL | Freq: Two times a day (BID) | ORAL | 0 refills | Status: AC | PRN

## 2024-02-29 ENCOUNTER — Encounter: Payer: Self-pay | Admitting: Nurse Practitioner

## 2024-02-29 DIAGNOSIS — R051 Acute cough: Secondary | ICD-10-CM

## 2024-02-29 DIAGNOSIS — I5032 Chronic diastolic (congestive) heart failure: Secondary | ICD-10-CM

## 2024-03-04 ENCOUNTER — Telehealth: Payer: Self-pay

## 2024-03-04 NOTE — Telephone Encounter (Signed)
 Can we call and get the patient in for a chest xray and blood work today if possible? I have placed the orders

## 2024-03-04 NOTE — Telephone Encounter (Signed)
 Left detailed voicemail for patient to call the office back.

## 2024-03-04 NOTE — Addendum Note (Signed)
 Addended by: WENDEE LYNWOOD HERO on: 03/04/2024 07:45 AM   Modules accepted: Orders

## 2024-03-04 NOTE — Telephone Encounter (Signed)
 Lvm to schedule for lab

## 2024-03-09 ENCOUNTER — Ambulatory Visit
Admission: RE | Admit: 2024-03-09 | Discharge: 2024-03-09 | Disposition: A | Discharge status: Home / Self Care | Source: Ambulatory Visit | Attending: Nurse Practitioner | Admitting: Nurse Practitioner

## 2024-03-09 DIAGNOSIS — I5032 Chronic diastolic (congestive) heart failure: Secondary | ICD-10-CM

## 2024-03-09 DIAGNOSIS — R051 Acute cough: Secondary | ICD-10-CM

## 2024-03-22 ENCOUNTER — Other Ambulatory Visit: Payer: Self-pay | Admitting: Internal Medicine

## 2024-03-28 ENCOUNTER — Encounter: Payer: Self-pay | Admitting: Internal Medicine

## 2024-03-30 ENCOUNTER — Encounter: Payer: Self-pay | Admitting: "Endocrinology

## 2024-03-30 DIAGNOSIS — E1129 Type 2 diabetes mellitus with other diabetic kidney complication: Secondary | ICD-10-CM

## 2024-03-30 MED ORDER — METFORMIN HCL 500 MG PO TABS: 1000.0000 mg | ORAL_TABLET | Freq: Two times a day (BID) | ORAL | 1 refills | Status: AC

## 2024-04-11 LAB — OPHTHALMOLOGY REPORT-SCANNED

## 2024-05-16 ENCOUNTER — Ambulatory Visit: Admitting: Internal Medicine

## 2024-07-27 ENCOUNTER — Ambulatory Visit: Admitting: "Endocrinology

## 2024-08-16 ENCOUNTER — Encounter (INDEPENDENT_AMBULATORY_CARE_PROVIDER_SITE_OTHER)

## 2024-08-16 ENCOUNTER — Ambulatory Visit (INDEPENDENT_AMBULATORY_CARE_PROVIDER_SITE_OTHER): Admitting: Vascular Surgery

## 2025-01-05 ENCOUNTER — Encounter: Admitting: Nurse Practitioner
